# Patient Record
Sex: Female | Born: 1935 | Race: Black or African American | Hispanic: No | Marital: Single | Smoking: Never smoker
Health system: Southern US, Community
[De-identification: ages and names within clinical notes are randomized; demographics above are authoritative.]

## PROBLEM LIST (undated history)

## (undated) DIAGNOSIS — I639 Cerebral infarction, unspecified: Secondary | ICD-10-CM

## (undated) DIAGNOSIS — I10 Essential (primary) hypertension: Secondary | ICD-10-CM

## (undated) HISTORY — DX: Essential (primary) hypertension: I10

---

## 2000-02-23 ENCOUNTER — Emergency Department (HOSPITAL_COMMUNITY): Admission: EM | Admit: 2000-02-23 | Discharge: 2000-02-23 | Payer: Self-pay | Admitting: Emergency Medicine

## 2002-01-12 ENCOUNTER — Emergency Department (HOSPITAL_COMMUNITY): Admission: EM | Admit: 2002-01-12 | Discharge: 2002-01-12 | Payer: Self-pay | Admitting: Emergency Medicine

## 2002-01-12 ENCOUNTER — Encounter: Payer: Self-pay | Admitting: Emergency Medicine

## 2012-01-13 ENCOUNTER — Other Ambulatory Visit: Payer: Self-pay | Admitting: Family Medicine

## 2012-01-13 ENCOUNTER — Other Ambulatory Visit: Payer: Self-pay

## 2012-01-13 ENCOUNTER — Ambulatory Visit
Admission: RE | Admit: 2012-01-13 | Discharge: 2012-01-13 | Disposition: A | Discharge status: Home / Self Care | Payer: BC Managed Care – PPO | Source: Ambulatory Visit | Attending: Family Medicine | Admitting: Family Medicine

## 2012-01-13 DIAGNOSIS — R590 Localized enlarged lymph nodes: Secondary | ICD-10-CM

## 2012-01-25 ENCOUNTER — Encounter (INDEPENDENT_AMBULATORY_CARE_PROVIDER_SITE_OTHER): Payer: Self-pay | Admitting: Surgery

## 2012-02-08 ENCOUNTER — Ambulatory Visit (INDEPENDENT_AMBULATORY_CARE_PROVIDER_SITE_OTHER): Payer: BC Managed Care – PPO | Admitting: Surgery

## 2013-03-20 ENCOUNTER — Inpatient Hospital Stay (HOSPITAL_COMMUNITY)
Admission: EM | Admit: 2013-03-20 | Discharge: 2013-03-23 | DRG: 557 | Disposition: A | Discharge status: Home Health | Payer: Medicare Other | Attending: Internal Medicine | Admitting: Internal Medicine

## 2013-03-20 ENCOUNTER — Encounter (HOSPITAL_COMMUNITY): Payer: Self-pay | Admitting: Emergency Medicine

## 2013-03-20 ENCOUNTER — Emergency Department (HOSPITAL_COMMUNITY): Payer: Medicare Other

## 2013-03-20 DIAGNOSIS — I10 Essential (primary) hypertension: Secondary | ICD-10-CM | POA: Diagnosis present

## 2013-03-20 DIAGNOSIS — I672 Cerebral atherosclerosis: Secondary | ICD-10-CM | POA: Diagnosis present

## 2013-03-20 DIAGNOSIS — G9340 Encephalopathy, unspecified: Secondary | ICD-10-CM | POA: Diagnosis present

## 2013-03-20 DIAGNOSIS — E860 Dehydration: Secondary | ICD-10-CM | POA: Diagnosis present

## 2013-03-20 DIAGNOSIS — Y92009 Unspecified place in unspecified non-institutional (private) residence as the place of occurrence of the external cause: Secondary | ICD-10-CM

## 2013-03-20 DIAGNOSIS — R7402 Elevation of levels of lactic acid dehydrogenase (LDH): Secondary | ICD-10-CM | POA: Diagnosis present

## 2013-03-20 DIAGNOSIS — I6350 Cerebral infarction due to unspecified occlusion or stenosis of unspecified cerebral artery: Secondary | ICD-10-CM | POA: Diagnosis present

## 2013-03-20 DIAGNOSIS — M6282 Rhabdomyolysis: Principal | ICD-10-CM | POA: Diagnosis present

## 2013-03-20 DIAGNOSIS — D72829 Elevated white blood cell count, unspecified: Secondary | ICD-10-CM | POA: Diagnosis present

## 2013-03-20 DIAGNOSIS — Z66 Do not resuscitate: Secondary | ICD-10-CM | POA: Diagnosis present

## 2013-03-20 DIAGNOSIS — I6789 Other cerebrovascular disease: Secondary | ICD-10-CM | POA: Diagnosis present

## 2013-03-20 DIAGNOSIS — IMO0002 Reserved for concepts with insufficient information to code with codable children: Secondary | ICD-10-CM

## 2013-03-20 DIAGNOSIS — E876 Hypokalemia: Secondary | ICD-10-CM | POA: Diagnosis not present

## 2013-03-20 DIAGNOSIS — X31XXXA Exposure to excessive natural cold, initial encounter: Secondary | ICD-10-CM | POA: Diagnosis present

## 2013-03-20 DIAGNOSIS — N179 Acute kidney failure, unspecified: Secondary | ICD-10-CM | POA: Diagnosis present

## 2013-03-20 DIAGNOSIS — W19XXXA Unspecified fall, initial encounter: Secondary | ICD-10-CM

## 2013-03-20 DIAGNOSIS — E870 Hyperosmolality and hypernatremia: Secondary | ICD-10-CM | POA: Diagnosis present

## 2013-03-20 DIAGNOSIS — T68XXXA Hypothermia, initial encounter: Secondary | ICD-10-CM

## 2013-03-20 DIAGNOSIS — E43 Unspecified severe protein-calorie malnutrition: Secondary | ICD-10-CM | POA: Insufficient documentation

## 2013-03-20 DIAGNOSIS — R339 Retention of urine, unspecified: Secondary | ICD-10-CM | POA: Diagnosis not present

## 2013-03-20 DIAGNOSIS — Z79899 Other long term (current) drug therapy: Secondary | ICD-10-CM

## 2013-03-20 DIAGNOSIS — R7401 Elevation of levels of liver transaminase levels: Secondary | ICD-10-CM | POA: Diagnosis present

## 2013-03-20 LAB — URINALYSIS, ROUTINE W REFLEX MICROSCOPIC
Nitrite: NEGATIVE
Protein, ur: 100 mg/dL — AB
Specific Gravity, Urine: 1.023 (ref 1.005–1.030)
Urobilinogen, UA: 1 mg/dL (ref 0.0–1.0)

## 2013-03-20 LAB — COMPREHENSIVE METABOLIC PANEL
ALT: 43 U/L — ABNORMAL HIGH (ref 0–35)
Albumin: 3.4 g/dL — ABNORMAL LOW (ref 3.5–5.2)
BUN: 60 mg/dL — ABNORMAL HIGH (ref 6–23)
Chloride: 104 mEq/L (ref 96–112)
Creatinine, Ser: 1.74 mg/dL — ABNORMAL HIGH (ref 0.50–1.10)
GFR calc non Af Amer: 26 mL/min — ABNORMAL LOW (ref >90)
Potassium: 4.1 mEq/L (ref 3.5–5.1)
Total Bilirubin: 0.9 mg/dL (ref 0.3–1.2)

## 2013-03-20 LAB — CBC WITH DIFFERENTIAL/PLATELET
Basophils Absolute: 0 10*3/uL (ref 0.0–0.1)
Basophils Relative: 0 % (ref 0–1)
Eosinophils Relative: 0 % (ref 0–5)
Hemoglobin: 15.7 g/dL — ABNORMAL HIGH (ref 12.0–15.0)
Lymphocytes Relative: 8 % — ABNORMAL LOW (ref 12–46)
Lymphs Abs: 1.5 10*3/uL (ref 0.7–4.0)
Monocytes Relative: 9 % (ref 3–12)
Neutrophils Relative %: 83 % — ABNORMAL HIGH (ref 43–77)
Platelets: ADEQUATE 10*3/uL (ref 150–400)
RDW: 15.4 % (ref 11.5–15.5)
Smear Review: ADEQUATE
WBC: 18.7 10*3/uL — ABNORMAL HIGH (ref 4.0–10.5)

## 2013-03-20 LAB — CG4 I-STAT (LACTIC ACID): Lactic Acid, Venous: 2.88 mmol/L — ABNORMAL HIGH (ref 0.5–2.2)

## 2013-03-20 LAB — TROPONIN I
Troponin I: <0.3 ng/mL (ref <0.30)
Troponin I: <0.3 ng/mL (ref <0.30)

## 2013-03-20 LAB — URINE MICROSCOPIC-ADD ON

## 2013-03-20 LAB — POCT I-STAT, CHEM 8
BUN: 53 mg/dL — ABNORMAL HIGH (ref 6–23)
Glucose, Bld: 145 mg/dL — ABNORMAL HIGH (ref 70–99)
HCT: 49 % — ABNORMAL HIGH (ref 36.0–46.0)
Hemoglobin: 16.7 g/dL — ABNORMAL HIGH (ref 12.0–15.0)
Sodium: 149 mEq/L — ABNORMAL HIGH (ref 135–145)
TCO2: 23 mmol/L (ref 0–100)

## 2013-03-20 LAB — CK: Total CK: 2267 U/L — ABNORMAL HIGH (ref 7–177)

## 2013-03-20 MED ORDER — ACETAMINOPHEN 650 MG RE SUPP: 650.0000 mg | Freq: Four times a day (QID) | RECTAL | Status: DC | PRN

## 2013-03-20 MED ORDER — SODIUM CHLORIDE 0.9 % IV BOLUS (SEPSIS)
1000.0000 mL | Freq: Once | INTRAVENOUS | Status: AC
  Administered 2013-03-20: 1000 mL via INTRAVENOUS

## 2013-03-20 MED ORDER — SODIUM CHLORIDE 0.9 % IJ SOLN
3.0000 mL | Freq: Two times a day (BID) | INTRAMUSCULAR | Status: DC
  Administered 2013-03-21 – 2013-03-22 (×2): 3 mL via INTRAVENOUS

## 2013-03-20 MED ORDER — ACETAMINOPHEN 325 MG PO TABS: 650.0000 mg | ORAL_TABLET | Freq: Four times a day (QID) | ORAL | Status: DC | PRN

## 2013-03-20 MED ORDER — ALBUTEROL SULFATE (5 MG/ML) 0.5% IN NEBU: 2.5000 mg | INHALATION_SOLUTION | RESPIRATORY_TRACT | Status: DC | PRN

## 2013-03-20 MED ORDER — DEXTROSE-NACL 5-0.45 % IV SOLN
INTRAVENOUS | Status: DC
  Administered 2013-03-20 – 2013-03-21 (×2): via INTRAVENOUS

## 2013-03-20 MED ORDER — AMLODIPINE BESYLATE 2.5 MG PO TABS
2.5000 mg | ORAL_TABLET | Freq: Every day | ORAL | Status: DC
  Filled 2013-03-20 (×3): qty 1

## 2013-03-20 MED ORDER — HYDRALAZINE HCL 20 MG/ML IJ SOLN
10.0000 mg | Freq: Four times a day (QID) | INTRAMUSCULAR | Status: DC | PRN
  Administered 2013-03-20: 10 mg via INTRAVENOUS
  Filled 2013-03-20: qty 1

## 2013-03-20 MED ORDER — ONDANSETRON HCL 4 MG/2ML IJ SOLN: 4.0000 mg | Freq: Four times a day (QID) | INTRAMUSCULAR | Status: DC | PRN

## 2013-03-20 MED ORDER — ONDANSETRON HCL 4 MG PO TABS: 4.0000 mg | ORAL_TABLET | Freq: Four times a day (QID) | ORAL | Status: DC | PRN

## 2013-03-20 MED ORDER — ENOXAPARIN SODIUM 40 MG/0.4ML ~~LOC~~ SOLN
40.0000 mg | SUBCUTANEOUS | Status: DC
  Administered 2013-03-20 – 2013-03-22 (×3): 40 mg via SUBCUTANEOUS
  Filled 2013-03-20 (×5): qty 0.4

## 2013-03-20 MED ORDER — SODIUM CHLORIDE 0.9 % IV SOLN: INTRAVENOUS | Status: DC

## 2013-03-20 NOTE — H&P (Signed)
[facility]  History and Physical  Cristina Patton NWG:[id number] DOB: 04-18-1927 DOA: 03/20/2013  Referring physician: EDP PCP: No primary provider on file.  Outpatient Specialists:  1. None  Chief Complaint: Presumed fall at home.  HPI: Cristina Patton is a 79 y.o. female with history of hypertension presented to the ER after being found by housekeeping fully clothed lying in the bathtub in her own urine and feces for 24-48 hours. She was also confused and is unable to give any history secondary to same. According to her niece at the bedside, patient is single, lives alone, quite coherent and independent of activities of daily living. Help comes home 3 times a week to clean her yard in her house. She was last seen or heard well 48 hours ago until today. Patient currently says she's okay and denies any complaints. In the ED, she was initially hypothermic but that resolved, sodium 146, creatinine 1.74 (baseline not available) AST 76, ALT 43, WBC 18.7, CK 2267, CT head and neck and chest x-ray without acute findings. UA unremarkable. Patient has been bolused with a liter of fluid and hospitalist patient requested.  Review of Systems: Unable to perform review of systems secondary to patient's altered mental status  Past Medical History  Diagnosis Date  . Hypertension    History reviewed. No pertinent past surgical history. Social History:  reports that she has never smoked. She does not have any smokeless tobacco history on file. She reports that she does not drink alcohol or use illicit drugs.   No Known Allergies  History reviewed. No pertinent family history. unable to obtain secondary to patient's altered mental status  Prior to Admission medications   Not on File   Physical Exam: Filed Vitals:   03/20/13 1602 03/20/13 1615 03/20/13 1628 03/20/13 1656  BP: 172/83 165/78  176/73  Pulse: 112 112  104  Temp:   97.7 F (36.5 C) 98.3 F (36.8 C)  TempSrc:   Rectal  Oral  Resp: 20 15  18   Weight:    55.611 kg (122 lb 9.6 oz)  SpO2: 95% 95%  98%     General exam: Moderately built and nourished female patient, lying comfortably supine on the bed in no obvious distress.  Head, eyes and ENT: Nontraumatic and normocephalic. Pupils equally reacting to light and accommodation-bilateral nearly mature cataracts. Oral mucosa dry.  Neck: Supple. No JVD, carotid bruit or thyromegaly.  Lymphatics: No lymphadenopathy.  Respiratory system: Clear to auscultation. No increased work of breathing.  Cardiovascular system: S1 and S2 heard, RRR. No JVD, murmurs, gallops, clicks or pedal edema.  Gastrointestinal system: Abdomen is nondistended, soft and nontender. Normal bowel sounds heard. No organomegaly or masses appreciated.  Central nervous system: Drowsy but easily arousable to call and oriented to self and place. Follow some instructions. No focal neurological deficits.  Extremities: Symmetric 5 x 5 power. Peripheral pulses symmetrically felt.   Skin: Patient has mild bruising of bilateral elbows and a scabbed area of skin tear approximately 1 x 1 cm between the shoulder blades without any acute findings or bleeding.  Musculoskeletal system: Negative exam.  Psychiatry: Pleasant and cooperative.   Labs on Admission:  Basic Metabolic Panel:  Recent Labs Lab 03/20/13 1400 03/20/13 1416  NA 146* 149*  K 4.1 3.3*  CL 104 111  CO2 21  --   GLUCOSE 143* 145*  BUN 60* 53*  CREATININE 1.74* 2.00*  CALCIUM 10.5  --    Liver Function Tests:  Recent Labs Lab 03/20/13 1400  AST 76*  ALT 43*  ALKPHOS 58  BILITOT 0.9  PROT 8.7*  ALBUMIN 3.4*   No results found for this basename: LIPASE, AMYLASE,  in the last 168 hours No results found for this basename: AMMONIA,  in the last 168 hours CBC:  Recent Labs Lab 03/20/13 1359 03/20/13 1416  WBC 18.7*  --   NEUTROABS 15.5*  --   HGB 15.7* 16.7*  HCT 43.7 49.0*  MCV 79.9  --   PLT PLATELET  CLUMPS NOTED ON SMEAR, COUNT APPEARS ADEQUATE  --    Cardiac Enzymes:  Recent Labs Lab 03/20/13 1359  CKTOTAL 2267*  TROPONINI <0.30    BNP (last 3 results) No results found for this basename: PROBNP,  in the last 8760 hours CBG: No results found for this basename: GLUCAP,  in the last 168 hours  Radiological Exams on Admission: Ct Head Wo Contrast  03/20/2013   CLINICAL DATA:  Question of fall.  EXAM: CT HEAD WITHOUT CONTRAST  CT CERVICAL SPINE WITHOUT CONTRAST  TECHNIQUE: Multidetector CT imaging of the head and cervical spine was performed following the standard protocol without intravenous contrast. Multiplanar CT image reconstructions of the cervical spine were also generated.  COMPARISON:  None.  FINDINGS: CT HEAD FINDINGS  No skull fracture or intracranial hemorrhage.  Small vessel disease type changes without CT evidence of large acute infarct.  Mild atrophy.  Vascular calcifications.  No intracranial mass lesion noted on this unenhanced exam.  CT CERVICAL SPINE FINDINGS  No cervical spine fracture.  Head tilt to left. Mild curvature cervical spine. No abnormal prevertebral soft tissue swelling. Question Mild edema posterior to the paraspinal musculature. If there is a high clinical suspicion of ligamentous injury, flexion and extension views or MR can be performed for further delineation.  Scarring lung apices.  IMPRESSION: No intracranial hemorrhage or skull fracture.  No cervical spine fracture detected.  Please see above.   Electronically Signed   By: Bridgett Larsson M.D.   On: 03/20/2013 14:40   Ct Cervical Spine Wo Contrast  03/20/2013   CLINICAL DATA:  Question of fall.  EXAM: CT HEAD WITHOUT CONTRAST  CT CERVICAL SPINE WITHOUT CONTRAST  TECHNIQUE: Multidetector CT imaging of the head and cervical spine was performed following the standard protocol without intravenous contrast. Multiplanar CT image reconstructions of the cervical spine were also generated.  COMPARISON:  None.   FINDINGS: CT HEAD FINDINGS  No skull fracture or intracranial hemorrhage.  Small vessel disease type changes without CT evidence of large acute infarct.  Mild atrophy.  Vascular calcifications.  No intracranial mass lesion noted on this unenhanced exam.  CT CERVICAL SPINE FINDINGS  No cervical spine fracture.  Head tilt to left. Mild curvature cervical spine. No abnormal prevertebral soft tissue swelling. Question Mild edema posterior to the paraspinal musculature. If there is a high clinical suspicion of ligamentous injury, flexion and extension views or MR can be performed for further delineation.  Scarring lung apices.  IMPRESSION: No intracranial hemorrhage or skull fracture.  No cervical spine fracture detected.  Please see above.   Electronically Signed   By: Bridgett Larsson M.D.   On: 03/20/2013 14:40   Dg Chest Port 1 View  03/20/2013   CLINICAL DATA:  Confusion and weakness.  EXAM: PORTABLE CHEST - 1 VIEW  COMPARISON:  01/13/2012.  FINDINGS: Mediastinum and hilar structures are normal. Stable elevation left hemidiaphragm present. Mild basilar atelectasis. Poor inspiration. Heart size normal. Pulmonary  vascularity normal. No acute bony abnormality.  IMPRESSION: Poor inspiration. Mild atelectasis left lung base. Stable elevation left hemidiaphragm . No significant abnormality otherwise noted.   Electronically Signed   By: Maisie Fus  Register   On: 03/20/2013 14:18    EKG: Independently reviewed. Sinus tachycardia at 107 beats per minute, normal axis, some baseline artifacts but no acute findings.  Assessment/Plan Principal Problem:   Dehydration Active Problems:   Acute renal failure   Leukocytosis, unspecified   Acute encephalopathy   Fall at home- ? syncope.   Hypernatremia   HTN (hypertension)   Dehydration with hypernatremia - Secondary to poor oral intake from fall and altered mental status - Hydrate with hypotonic IV fluids  Acute renal failure - Secondary to dehydration and mild  rhabdomyolysis - No details if patient is an ACE inhibitor/ARB/diuretics or NSAIDs. - IV fluids and follow BMP in a.m. If creatinine does not normalize, consider renal ultrasound.  Mild rhabdomyolysis - Secondary to fall and lying in bathtub for extended period of time - Strict input and output - IV fluids and follow CK in the a.m. - Mild transaminitis most likely secondary to rhabdomyolysis  Acute encephalopathy - Likely secondary to acute medical illness: Dehydration, hypernatremia, acute renal failure - Treat underlying cause and follow clinically. - Check TSH. UA not suggestive of UTI. - If mental status does not normalize, consider MRI brain  Hypothermia - Possibly cold exposure from lying in bathtub - Resolved. - Check TSH.  Possible fall/? Syncope - No clear history available. - Cycle cardiac enzymes, check 2-D echo and carotid Dopplers. - PT and OT evaluation  Leukocytosis - Likely stress margination. - Follow CBC in a.m.  Uncontrolled hypertension - Unclear if she is on any medications at home - Start oral amlodipine 2.5 mg daily and when necessary IV hydralazine      Code Status: Full  Family Communication: Discussed with patient's niece Ms. Jera Headings at bedside.  Disposition Plan: To be determined   Time spent: 65 minutes  Hessie Varone, MD, FACP, FHM. [facility] Pager [phone]  If 7PM-7AM, please contact night-coverage [email or web address] Password [email or web address] 03/20/2013, 5:21 PM

## 2013-03-20 NOTE — ED Notes (Signed)
Family at bedside. 

## 2013-03-20 NOTE — ED Provider Notes (Signed)
CSN: [id number]     Arrival date & time 03/20/13  1238 History   First MD Initiated Contact with Patient 03/20/13 1303     Chief Complaint  Patient presents with  . Fall   (Consider location/radiation/quality/duration/timing/severity/associated sxs/prior Treatment) HPI  FULL CODE  Cristina Patton is a 79 y.o.female with a significant PMH of hypertension who supposedly is normally driving and alert presents to the ER after being found by house keeping fully clothes in the bath tube in her own urine and feces suggesting that she has been there for more than 24 hours. She has altered mental status, confused and is unable to tell us what happened. No obvious signs of trauma noted.    Patient oral temperature on arrival was noted to be 95.5 but rectally it was 98.6.  Past Medical History  Diagnosis Date  . Hypertension    History reviewed. No pertinent past surgical history. History reviewed. No pertinent family history. History  Substance Use Topics  . Smoking status: Never Smoker   . Smokeless tobacco: Not on file  . Alcohol Use: No   OB History   Grav Para Term Preterm Abortions TAB SAB Ect Mult Living                 Review of Systems LEVEL 5- AMS  Allergies  Review of patient's allergies indicates no known allergies.  Home Medications   Current Outpatient Rx  Name  Route  Sig  Dispense  Refill  . aspirin 81 MG tablet   Oral   Take 81 mg by mouth daily.         Marland Kitchen olmesartan-hydrochlorothiazide (BENICAR HCT) 20-12.5 MG per tablet   Oral   Take 1 tablet by mouth daily.          BP 169/72  Pulse 109  Temp(Src) 95.5 F (35.3 C) (Oral)  Resp 16  SpO2 99% Physical Exam  Nursing note and vitals reviewed. Constitutional: Vital signs are normal. She appears well-developed and well-nourished. She appears lethargic. She has a sickly appearance. She appears distressed. Backboard in place.  HENT:  Head: Normocephalic and atraumatic.  Cardiovascular: Normal  rate and regular rhythm.   Pulmonary/Chest: Effort normal.  Abdominal: Soft.  Neurological: She appears lethargic.  Skin: Skin is warm and dry.       ED Course  Procedures (including critical care time) Labs Review Labs Reviewed  CBC WITH DIFFERENTIAL - Abnormal; Notable for the following:    WBC 18.7 (*)    RBC 5.47 (*)    Hemoglobin 15.7 (*)    Neutrophils Relative % 83 (*)    Lymphocytes Relative 8 (*)    Neutro Abs 15.5 (*)    Monocytes Absolute 1.7 (*)    All other components within normal limits  CK - Abnormal; Notable for the following:    Total CK 2267 (*)    All other components within normal limits  COMPREHENSIVE METABOLIC PANEL - Abnormal; Notable for the following:    Sodium 146 (*)    Glucose, Bld 143 (*)    BUN 60 (*)    Creatinine, Ser 1.74 (*)    Total Protein 8.7 (*)    Albumin 3.4 (*)    AST 76 (*)    ALT 43 (*)    GFR calc non Af Amer 26 (*)    GFR calc Af Amer 30 (*)    All other components within normal limits  CG4 I-STAT (LACTIC ACID) - Abnormal; Notable for  the following:    Lactic Acid, Venous 2.88 (*)    All other components within normal limits  POCT I-STAT, CHEM 8 - Abnormal; Notable for the following:    Sodium 149 (*)    Potassium 3.3 (*)    BUN 53 (*)    Creatinine, Ser 2.00 (*)    Glucose, Bld 145 (*)    Hemoglobin 16.7 (*)    HCT 49.0 (*)    All other components within normal limits  TROPONIN I  OCCULT BLOOD X 1 CARD TO LAB, STOOL  URINALYSIS, ROUTINE W REFLEX MICROSCOPIC   Imaging Review Ct Head Wo Contrast  03/20/2013   CLINICAL DATA:  Question of fall.  EXAM: CT HEAD WITHOUT CONTRAST  CT CERVICAL SPINE WITHOUT CONTRAST  TECHNIQUE: Multidetector CT imaging of the head and cervical spine was performed following the standard protocol without intravenous contrast. Multiplanar CT image reconstructions of the cervical spine were also generated.  COMPARISON:  None.  FINDINGS: CT HEAD FINDINGS  No skull fracture or intracranial  hemorrhage.  Small vessel disease type changes without CT evidence of large acute infarct.  Mild atrophy.  Vascular calcifications.  No intracranial mass lesion noted on this unenhanced exam.  CT CERVICAL SPINE FINDINGS  No cervical spine fracture.  Head tilt to left. Mild curvature cervical spine. No abnormal prevertebral soft tissue swelling. Question Mild edema posterior to the paraspinal musculature. If there is a high clinical suspicion of ligamentous injury, flexion and extension views or MR can be performed for further delineation.  Scarring lung apices.  IMPRESSION: No intracranial hemorrhage or skull fracture.  No cervical spine fracture detected.  Please see above.   Electronically Signed   By: Bridgett Larsson M.D.   On: 03/20/2013 14:40   Ct Cervical Spine Wo Contrast  03/20/2013   CLINICAL DATA:  Question of fall.  EXAM: CT HEAD WITHOUT CONTRAST  CT CERVICAL SPINE WITHOUT CONTRAST  TECHNIQUE: Multidetector CT imaging of the head and cervical spine was performed following the standard protocol without intravenous contrast. Multiplanar CT image reconstructions of the cervical spine were also generated.  COMPARISON:  None.  FINDINGS: CT HEAD FINDINGS  No skull fracture or intracranial hemorrhage.  Small vessel disease type changes without CT evidence of large acute infarct.  Mild atrophy.  Vascular calcifications.  No intracranial mass lesion noted on this unenhanced exam.  CT CERVICAL SPINE FINDINGS  No cervical spine fracture.  Head tilt to left. Mild curvature cervical spine. No abnormal prevertebral soft tissue swelling. Question Mild edema posterior to the paraspinal musculature. If there is a high clinical suspicion of ligamentous injury, flexion and extension views or MR can be performed for further delineation.  Scarring lung apices.  IMPRESSION: No intracranial hemorrhage or skull fracture.  No cervical spine fracture detected.  Please see above.   Electronically Signed   By: Bridgett Larsson M.D.    On: 03/20/2013 14:40   Dg Chest Port 1 View  03/20/2013   CLINICAL DATA:  Confusion and weakness.  EXAM: PORTABLE CHEST - 1 VIEW  COMPARISON:  01/13/2012.  FINDINGS: Mediastinum and hilar structures are normal. Stable elevation left hemidiaphragm present. Mild basilar atelectasis. Poor inspiration. Heart size normal. Pulmonary vascularity normal. No acute bony abnormality.  IMPRESSION: Poor inspiration. Mild atelectasis left lung base. Stable elevation left hemidiaphragm . No significant abnormality otherwise noted.   Electronically Signed   By: Maisie Fus  Register   On: 03/20/2013 14:18    EKG Interpretation   None  MDM   1. Rhabdomyolysis   2. Dehydration      Patients niece and home keeper are here. The patient is becoming more cognoscente with fluids and time. Her EKG, Troponin, and imaging did not show any acute process. Her labs show that she has rhabdomyolysis and that she is dehydrated. Rehydrating in the ED.  Patient to be admitted to [facility], tele, obs, team 10, [facility]    Dorthula Matas, PA-C 03/20/13 1537

## 2013-03-20 NOTE — ED Notes (Signed)
Pt here by ems ? Fall, pt was in tub for 2 days, denies falling sts she just laid back and stayed for 2 days, immobilized in ked in position found in. Pt complains of pain with movement, no signs of trauma noted per ems. Per cleaning lady that found patient, she may or may not be morec onfused than normal. sts alert and oriented to all but event.

## 2013-03-20 NOTE — ED Notes (Signed)
Did in and out cath on patient dark yellow urine in return 

## 2013-03-21 ENCOUNTER — Encounter (HOSPITAL_COMMUNITY): Payer: Self-pay | Admitting: Emergency Medicine

## 2013-03-21 DIAGNOSIS — R55 Syncope and collapse: Secondary | ICD-10-CM

## 2013-03-21 DIAGNOSIS — I517 Cardiomegaly: Secondary | ICD-10-CM

## 2013-03-21 DIAGNOSIS — E43 Unspecified severe protein-calorie malnutrition: Secondary | ICD-10-CM | POA: Insufficient documentation

## 2013-03-21 LAB — COMPREHENSIVE METABOLIC PANEL
ALT: 33 U/L (ref 0–35)
Alkaline Phosphatase: 44 U/L (ref 39–117)
BUN: 54 mg/dL — ABNORMAL HIGH (ref 6–23)
CO2: 21 mEq/L (ref 19–32)
Calcium: 9 mg/dL (ref 8.4–10.5)
Chloride: 110 mEq/L (ref 96–112)
GFR calc Af Amer: 38 mL/min — ABNORMAL LOW (ref >90)
GFR calc non Af Amer: 33 mL/min — ABNORMAL LOW (ref >90)
Glucose, Bld: 228 mg/dL — ABNORMAL HIGH (ref 70–99)
Sodium: 147 mEq/L — ABNORMAL HIGH (ref 135–145)

## 2013-03-21 LAB — CBC
HCT: 36.7 % (ref 36.0–46.0)
Hemoglobin: 12.9 g/dL (ref 12.0–15.0)
MCH: 28.1 pg (ref 26.0–34.0)
MCV: 80 fL (ref 78.0–100.0)
RBC: 4.59 MIL/uL (ref 3.87–5.11)
WBC: 16.6 10*3/uL — ABNORMAL HIGH (ref 4.0–10.5)

## 2013-03-21 LAB — GLUCOSE, CAPILLARY
Glucose-Capillary: 194 mg/dL — ABNORMAL HIGH (ref 70–99)
Glucose-Capillary: 155 mg/dL — ABNORMAL HIGH (ref 70–99)
Glucose-Capillary: 116 mg/dL — ABNORMAL HIGH (ref 70–99)

## 2013-03-21 LAB — HEMOGLOBIN A1C: Hgb A1c MFr Bld: 5.7 % — ABNORMAL HIGH (ref <5.7)

## 2013-03-21 LAB — SODIUM, URINE, RANDOM: Sodium, Ur: 17 mEq/L

## 2013-03-21 MED ORDER — POTASSIUM CHLORIDE 10 MEQ/100ML IV SOLN
10.0000 meq | INTRAVENOUS | Status: AC
  Administered 2013-03-21 (×3): 10 meq via INTRAVENOUS
  Filled 2013-03-21 (×4): qty 100

## 2013-03-21 MED ORDER — GLUCERNA SHAKE PO LIQD
237.0000 mL | Freq: Two times a day (BID) | ORAL | Status: DC
  Administered 2013-03-22 – 2013-03-23 (×3): 237 mL via ORAL

## 2013-03-21 MED ORDER — AMLODIPINE BESYLATE 10 MG PO TABS
10.0000 mg | ORAL_TABLET | Freq: Every day | ORAL | Status: DC
  Administered 2013-03-22 – 2013-03-23 (×2): 10 mg via ORAL
  Filled 2013-03-21 (×4): qty 1

## 2013-03-21 MED ORDER — POTASSIUM CHLORIDE IN NACL 40-0.9 MEQ/L-% IV SOLN
INTRAVENOUS | Status: AC
  Administered 2013-03-21: 75 mL/h via INTRAVENOUS
  Administered 2013-03-22: via INTRAVENOUS
  Filled 2013-03-21 (×2): qty 1000

## 2013-03-21 MED ORDER — TAMSULOSIN HCL 0.4 MG PO CAPS
0.4000 mg | ORAL_CAPSULE | Freq: Every day | ORAL | Status: DC
  Administered 2013-03-22 – 2013-03-23 (×2): 0.4 mg via ORAL
  Filled 2013-03-21 (×4): qty 1

## 2013-03-21 MED ORDER — DEXTROSE 5 % IV SOLN
INTRAVENOUS | Status: DC
  Administered 2013-03-21: 08:00:00 via INTRAVENOUS

## 2013-03-21 NOTE — Progress Notes (Signed)
INITIAL NUTRITION ASSESSMENT  DOCUMENTATION CODES Per approved criteria  -Severe malnutrition in the context of chronic illness   INTERVENTION:  Glucerna Shake twice daily (220 kcals, 10 gm protein per 8 fl oz bottle) RD to follow for nutrition care plan  NUTRITION DIAGNOSIS: Increased nutrient needs related to wound healing as evidenced by estimated nutrition needs  Goal: Pt to meet >/= 90% of their estimated nutrition needs   Monitor:  PO & supplemental intake, weight, labs, I/O's  Reason for Assessment: Low Braden  79 y.o. female  Admitting Dx: Dehydration  ASSESSMENT: Patient with PMH of HTN presented to ER after being found by housekeeping fully clothed lying in the bathtub in her own urine and feces for 24-48 hours; patient was hypothermic and dehydration; bolused with a liter of fluid.  Patient sleepy upon RD entry; just returned from [facility]ascular Lab; patient lives alone and RD suspects patient's PO intake is suboptimal; patient with visible severe muscle wasting & subcutaneous fat loss to upper body; Stage II wound present to sacrum; at risk for further skin breakdown given low braden score -- RD to order supplements.  Patient meets criteria for severe malnutrition in the context of chronic illness as evidenced by suspected < 75% intake of estimated energy requirement for > 1 month, muscle loss (clavicles, acromion bone region, thoracic region) and subcutaneous fat loss (upper arm region).  Height: 5' 6 (167.6 cm)  Weight: Wt Readings from Last 1 Encounters:  03/21/13 126 lb 4.8 oz (57.289 kg)    Ideal Body Weight: 130 lb  % Ideal Body Weight: 96%  Wt Readings from Last 10 Encounters:  03/21/13 126 lb 4.8 oz (57.289 kg)    Usual Body Weight: unknown  % Usual Body Weight: ---  BMI:  20.5 kg/m2  Estimated Nutritional Needs: Kcal: 1250-1450 Protein: 60-70 gm Fluid: >/= 1.5 L  Skin: Stage II pressure ulcer to sacrum  Diet Order: Cardiac  EDUCATION  NEEDS: -No education needs identified at this time   Intake/Output Summary (Last 24 hours) at 03/21/13 1249 Last data filed at 03/21/13 0821  Gross per 24 hour  Intake   1575 ml  Output   1000 ml  Net    575 ml     Labs:   Recent Labs Lab 03/20/13 1400 03/20/13 1416 03/21/13 0239 03/21/13 0430  NA 146* 149* 147*  --   K 4.1 3.3* 2.7*  --   CL 104 111 110  --   CO2 21  --  21  --   BUN 60* 53* 54*  --   CREATININE 1.74* 2.00* 1.43*  --   CALCIUM 10.5  --  9.0  --   MG  --   --   --  2.5  GLUCOSE 143* 145* 228*  --     CBG (last 3)   Recent Labs  03/20/13 2102 03/21/13 0604 03/21/13 1114  GLUCAP 179* 194* 172*    Scheduled Meds: . amLODipine  2.5 mg Oral Daily  . enoxaparin (LOVENOX) injection  40 mg Subcutaneous Q24H  . sodium chloride  3 mL Intravenous Q12H  . tamsulosin  0.4 mg Oral Daily    Continuous Infusions: . 0.9 % NaCl with KCl 40 mEq / L 75 mL/hr (03/21/13 1042)    Past Medical History  Diagnosis Date  . Hypertension     History reviewed. No pertinent past surgical history.  Maureen Chatters, RD, LDN Pager #: [phone] After-Hours Pager #: [phone]

## 2013-03-21 NOTE — Progress Notes (Signed)
Clinical Social Work Department BRIEF PSYCHOSOCIAL ASSESSMENT 03/21/2013  Patient:  AHUVA, POYNOR     Account Number:  [id number]     Admit date:  03/20/2013  Clinical Social Worker:  Carren Rang  Date/Time:  03/21/2013 01:40 PM  Referred by:  Physician  Date Referred:  03/21/2013 Referred for  SNF Placement   Other Referral:   Interview type:  Other - See comment Other interview type:   CSW spoke to patinet's great niece, by bedside    PSYCHOSOCIAL DATA Living Status:  ALONE Admitted from facility:   Level of care:   Primary support name:  Kim Primary support relationship to patient:  FAMILY Degree of support available:   Supportive Family    CURRENT CONCERNS Current Concerns  Post-Acute Placement   Other Concerns:    SOCIAL WORK ASSESSMENT / PLAN Clinical Social Worker received referral for SNF placement at d/c. CSW introduced self and explained reason for visit. Patient had visitors by bedside. CSW explained SNF process to patient's great niece, Selena Batten. Patient's niece reported she is agreeable for SNF placement.  CSW will complete FL2 for MD's signature and will update family when bed offers are received.   Assessment/plan status:  Psychosocial Support/Ongoing Assessment of Needs Other assessment/ plan:   Information/referral to community resources:   SNF information    PATIENT'S/FAMILY'S RESPONSE TO PLAN OF CARE: Patient's family states they are agreeable to SNF placement.        Maree Krabbe, MSW, [facility] [phone]

## 2013-03-21 NOTE — Progress Notes (Signed)
Clinical Social Work Department CLINICAL SOCIAL WORK PLACEMENT NOTE 03/21/2013  Patient:  Cristina Patton, Cristina Patton  Account Number:  [id number] Admit date:  03/20/2013  Clinical Social Worker:  Carren Rang  Date/time:  03/21/2013 01:40 PM  Clinical Social Work is seeking post-discharge placement for this patient at the following level of care:   SKILLED NURSING   (*CSW will update this form in Epic as items are completed)   03/21/2013  Patient/family provided with [facility] Department of Clinical Social Work's list of facilities offering this level of care within the geographic area requested by the patient (or if unable, by the patient's family).  03/21/2013  Patient/family informed of their freedom to choose among providers that offer the needed level of care, that participate in Medicare, Medicaid or managed care program needed by the patient, have an available bed and are willing to accept the patient.  03/21/2013  Patient/family informed of [facility]' ownership interest in [facility], as well as of the fact that they are under no obligation to receive care at this facility.  PASARR submitted to EDS on 03/21/2013 PASARR number received from EDS on 03/21/2013  FL2 transmitted to all facilities in geographic area requested by pt/family on  03/21/2013 FL2 transmitted to all facilities within larger geographic area on   Patient informed that his/her managed care company has contracts with or will negotiate with  certain facilities, including the following:     Patient/family informed of bed offers received:   Patient chooses bed at  Physician recommends and patient chooses bed at    Patient to be transferred to  on   Patient to be transferred to facility by   The following physician request were entered in Epic:   Additional Comments:  Maree Krabbe, MSW, [employer] [phone]

## 2013-03-21 NOTE — Progress Notes (Signed)
BP DOWN 130/59.

## 2013-03-21 NOTE — Progress Notes (Signed)
Pt unable to void. Bladder scanned pt and pt had >999 ml in bladder. RN notified MD. New orders given to place foley and 0.4 mg Flomax daily. Pt tolerated procedure well with urine return. RN also notified MD of pulsating carotid artery. Will continue to monitor pt closely.  Genevive Bi, RN

## 2013-03-21 NOTE — Progress Notes (Signed)
Pt unable to swallow. RN entered pt room to give medication and pt had thick clear saliva hanging from side of mouth. RN set up suction and will suction pt PRN. Will continue to monitor closely. Holding oral medications until speech evaluation. Genevive Bi, RN

## 2013-03-21 NOTE — Care Management Note (Unsigned)
    Page 1 of 1   03/21/2013     4:32:09 PM   CARE MANAGEMENT NOTE 03/21/2013  Patient:  Cristina Patton, Cristina Patton   Account Number:  [id number]  Date Initiated:  03/21/2013  Documentation initiated by:  Kendra Grissett  Subjective/Objective Assessment:   PT FOUND DOWN AT HOME IN BATHTUB X 48H; ADM WITH ARF, DEHYDRATION AND MILD RHABDOMYOLYSIS.  PTA, PT LIVED ALONE AND WAS INDEPENDENT.     Action/Plan:   WILL LIKELY NEED SNF PLACEMENT.  PT WILL NEED PT/OT CONSULT FOR INSURANCE AUTH.  CSW CONSULTED TO FACILITATE POSSIBLE DC TO SNF WHEN MEDICALLY STABLE FOR DC.   Anticipated DC Date:  03/24/2013   Anticipated DC Plan:  SKILLED NURSING FACILITY  In-house referral  Clinical Social Worker      DC Planning Services  CM consult      Choice offered to / List presented to:             Status of service:  In process, will continue to follow Medicare Important Message given?   (If response is "NO", the following Medicare IM given date fields will be blank) Date Medicare IM given:   Date Additional Medicare IM given:    Discharge Disposition:    Per UR Regulation:  Reviewed for med. necessity/level of care/duration of stay  If discussed at Long Length of Stay Meetings, dates discussed:    Comments:

## 2013-03-21 NOTE — Progress Notes (Signed)
VASCULAR LAB PRELIMINARY  PRELIMINARY  PRELIMINARY  PRELIMINARY  Carotid duplex completed.    Preliminary report:  Bilateral:  1-39% ICA stenosis.  Vertebral artery flow is antegrade.     Brandon Scarbrough, RVS 03/21/2013, 12:48 PM

## 2013-03-21 NOTE — Progress Notes (Signed)
[facility]                                                                                Patient Demographics  Cristina Patton, is a 79 y.o. female, DOB - 1926/12/04, ZOX:[id number]  Admit date - 03/20/2013   Admitting Physician Elease Etienne, MD  Outpatient Primary MD for the patient is No primary provider on file.  LOS - 1   Chief Complaint  Patient presents with  . Fall        Assessment & Plan    Dehydration with hypernatremia  - Secondary to poor oral intake from fall and altered mental status  - Hydrate with hypotonic IV fluids    Acute renal failure  - Secondary to dehydration and mild rhabdomyolysis  - No details if patient is an ACE inhibitor/ARB/diuretics or NSAIDs.  - IV fluids and follow BMP in a.m. If creatinine does not normalize, consider renal ultrasound.    Mild rhabdomyolysis  - Secondary to fall and lying in bathtub for extended period of time  - Strict input and output  - IV fluids and follow CK in the a.m.  - Mild transaminitis most likely secondary to rhabdomyolysis      Acute encephalopathy  - Likely secondary to acute medical illness: Dehydration, hypernatremia, acute renal failure  - Treat underlying cause and follow clinically.  - He has some right-sided neglect, will obtain MRI to rule out CVA     Hypothermia  - Possibly cold exposure from lying in bathtub  - Resolved.      Possible fall/? Syncope  - No clear history available.  - Negative cardiac enzymes, stable EKG, monitor orthostatics,  check 2-D echo and carotid Dopplers.  - PT and OT evaluation      Leukocytosis  - Likely stress margination.  - She is afebrile, chest x-ray and UA stable we will monitor clinically     Uncontrolled hypertension  - Place her on Norvasc along with as needed IV hydralazine     Hypokalemia    replace and monitor     Code Status: DO NOT RESUSCITATE  Family Communication:  Niece  Disposition Plan: To be  decided likely SNF   Procedures CT scan head and C-spine, MRI Brain, carotid duplex ultrasound   Consults      Medications  Scheduled Meds: . amLODipine  2.5 mg Oral Daily  . enoxaparin (LOVENOX) injection  40 mg Subcutaneous Q24H  . feeding supplement (GLUCERNA SHAKE)  237 mL Oral BID BM  . sodium chloride  3 mL Intravenous Q12H  . tamsulosin  0.4 mg Oral Daily   Continuous Infusions: . 0.9 % NaCl with KCl 40 mEq / L 75 mL/hr (03/21/13 1042)   PRN Meds:.acetaminophen, acetaminophen, albuterol, hydrALAZINE, ondansetron (ZOFRAN) IV, ondansetron  DVT Prophylaxis  Lovenox   Lab Results  Component Value Date   PLT 579* 03/21/2013    Antibiotics    Anti-infectives   None          Subjective:   Cristina Patton today has, No headache, No chest pain, No abdominal pain - No Nausea, No new weakness tingling or numbness, No Cough - SOB.  Objective:   Filed Vitals:   03/21/13 0037 03/21/13 0500 03/21/13 0510 03/21/13 1346  BP: 130/59  153/72 155/73  Pulse: 112  100 98  Temp:   97.5 F (36.4 C) 98 F (36.7 C)  TempSrc:   Oral Oral  Resp:   18 18  Weight:  57.289 kg (126 lb 4.8 oz)    SpO2:   97% 97%    Wt Readings from Last 3 Encounters:  03/21/13 57.289 kg (126 lb 4.8 oz)     Intake/Output Summary (Last 24 hours) at 03/21/13 1430 Last data filed at 03/21/13 1610  Gross per 24 hour  Intake   1575 ml  Output   2000 ml  Net   -425 ml    Exam She is somnolent but answers basic questions and follows basic commands,, No new F.N deficits, but some right-sided neglect  [place].AT,PERRAL Supple Neck,No JVD, No cervical lymphadenopathy appriciated.  Symmetrical Chest wall movement, Good air movement bilaterally, CTAB RRR,No Gallops,Rubs or new Murmurs, No Parasternal Heave +ve B.Sounds, Abd Soft, Non tender, No organomegaly appriciated, No rebound - guarding or rigidity. No Cyanosis, Clubbing or edema, No new Rash or bruise     Data Review   Micro  Results No results found for this or any previous visit (from the past 240 hour(s)).  Radiology Reports Ct Head Wo Contrast  03/20/2013   CLINICAL DATA:  Question of fall.  EXAM: CT HEAD WITHOUT CONTRAST  CT CERVICAL SPINE WITHOUT CONTRAST  TECHNIQUE: Multidetector CT imaging of the head and cervical spine was performed following the standard protocol without intravenous contrast. Multiplanar CT image reconstructions of the cervical spine were also generated.  COMPARISON:  None.  FINDINGS: CT HEAD FINDINGS  No skull fracture or intracranial hemorrhage.  Small vessel disease type changes without CT evidence of large acute infarct.  Mild atrophy.  Vascular calcifications.  No intracranial mass lesion noted on this unenhanced exam.  CT CERVICAL SPINE FINDINGS  No cervical spine fracture.  Head tilt to left. Mild curvature cervical spine. No abnormal prevertebral soft tissue swelling. Question Mild edema posterior to the paraspinal musculature. If there is a high clinical suspicion of ligamentous injury, flexion and extension views or MR can be performed for further delineation.  Scarring lung apices.  IMPRESSION: No intracranial hemorrhage or skull fracture.  No cervical spine fracture detected.  Please see above.   Electronically Signed   By: Bridgett Larsson M.D.   On: 03/20/2013 14:40   Ct Cervical Spine Wo Contrast  03/20/2013   CLINICAL DATA:  Question of fall.  EXAM: CT HEAD WITHOUT CONTRAST  CT CERVICAL SPINE WITHOUT CONTRAST  TECHNIQUE: Multidetector CT imaging of the head and cervical spine was performed following the standard protocol without intravenous contrast. Multiplanar CT image reconstructions of the cervical spine were also generated.  COMPARISON:  None.  FINDINGS: CT HEAD FINDINGS  No skull fracture or intracranial hemorrhage.  Small vessel disease type changes without CT evidence of large acute infarct.  Mild atrophy.  Vascular calcifications.  No intracranial mass lesion noted on this  unenhanced exam.  CT CERVICAL SPINE FINDINGS  No cervical spine fracture.  Head tilt to left. Mild curvature cervical spine. No abnormal prevertebral soft tissue swelling. Question Mild edema posterior to the paraspinal musculature. If there is a high clinical suspicion of ligamentous injury, flexion and extension views or MR can be performed for further delineation.  Scarring lung apices.  IMPRESSION: No intracranial hemorrhage or skull fracture.  No cervical  spine fracture detected.  Please see above.   Electronically Signed   By: Bridgett Larsson M.D.   On: 03/20/2013 14:40   Dg Chest Port 1 View  03/20/2013   CLINICAL DATA:  Confusion and weakness.  EXAM: PORTABLE CHEST - 1 VIEW  COMPARISON:  01/13/2012.  FINDINGS: Mediastinum and hilar structures are normal. Stable elevation left hemidiaphragm present. Mild basilar atelectasis. Poor inspiration. Heart size normal. Pulmonary vascularity normal. No acute bony abnormality.  IMPRESSION: Poor inspiration. Mild atelectasis left lung base. Stable elevation left hemidiaphragm . No significant abnormality otherwise noted.   Electronically Signed   By: Maisie Fus  Register   On: 03/20/2013 14:18    CBC  Recent Labs Lab 03/20/13 1359 03/20/13 1416 03/21/13 0239  WBC 18.7*  --  16.6*  HGB 15.7* 16.7* 12.9  HCT 43.7 49.0* 36.7  PLT PLATELET CLUMPS NOTED ON SMEAR, COUNT APPEARS ADEQUATE  --  579*  MCV 79.9  --  80.0  MCH 28.7  --  28.1  MCHC 35.9  --  35.1  RDW 15.4  --  15.5  LYMPHSABS 1.5  --   --   MONOABS 1.7*  --   --   EOSABS 0.0  --   --   BASOSABS 0.0  --   --     Chemistries   Recent Labs Lab 03/20/13 1400 03/20/13 1416 03/21/13 0239 03/21/13 0430  NA 146* 149* 147*  --   K 4.1 3.3* 2.7*  --   CL 104 111 110  --   CO2 21  --  21  --   GLUCOSE 143* 145* 228*  --   BUN 60* 53* 54*  --   CREATININE 1.74* 2.00* 1.43*  --   CALCIUM 10.5  --  9.0  --   MG  --   --   --  2.5  AST 76*  --  52*  --   ALT 43*  --  33  --   ALKPHOS 58  --   44  --   BILITOT 0.9  --  0.8  --    ------------------------------------------------------------------------------------------------------------------ CrCl is unknown because there is no height on file for the current visit. ------------------------------------------------------------------------------------------------------------------  Recent Labs  03/20/13 2001  HGBA1C 5.7*   ------------------------------------------------------------------------------------------------------------------ No results found for this basename: CHOL, HDL, LDLCALC, TRIG, CHOLHDL, LDLDIRECT,  in the last 72 hours ------------------------------------------------------------------------------------------------------------------  Recent Labs  03/20/13 2001  TSH 0.264*   ------------------------------------------------------------------------------------------------------------------ No results found for this basename: VITAMINB12, FOLATE, FERRITIN, TIBC, IRON, RETICCTPCT,  in the last 72 hours  Coagulation profile No results found for this basename: INR, PROTIME,  in the last 168 hours  No results found for this basename: DDIMER,  in the last 72 hours  Cardiac Enzymes  Recent Labs Lab 03/20/13 1359 03/20/13 2001 03/21/13 0239  TROPONINI <0.30 <0.30 <0.30   ------------------------------------------------------------------------------------------------------------------ No components found with this basename: POCBNP,      Time Spent in minutes  40   Susa Raring K M.D on 03/21/2013 at 2:30 PM  Between 7am to 7pm - Pager - [phone]  After 7pm go to [email or web address] - password [email or web address]  And look for the night coverage person covering for me after hours  [facility] Office  [phone]

## 2013-03-21 NOTE — Progress Notes (Signed)
BP 174/81 PATIENT LETHARGIC  HYDRALAZINE 10 MG I.V. GIVEN.

## 2013-03-22 ENCOUNTER — Inpatient Hospital Stay (HOSPITAL_COMMUNITY): Payer: Medicare Other

## 2013-03-22 LAB — BASIC METABOLIC PANEL
BUN: 34 mg/dL — ABNORMAL HIGH (ref 6–23)
CO2: 23 mEq/L (ref 19–32)
Chloride: 118 mEq/L — ABNORMAL HIGH (ref 96–112)
GFR calc Af Amer: 67 mL/min — ABNORMAL LOW (ref >90)
Potassium: 4.1 mEq/L (ref 3.5–5.1)
Sodium: 149 mEq/L — ABNORMAL HIGH (ref 135–145)

## 2013-03-22 LAB — CBC
HCT: 35 % — ABNORMAL LOW (ref 36.0–46.0)
MCHC: 34.3 g/dL (ref 30.0–36.0)
MCV: 81.6 fL (ref 78.0–100.0)
RBC: 4.29 MIL/uL (ref 3.87–5.11)
RDW: 15.8 % — ABNORMAL HIGH (ref 11.5–15.5)
WBC: 13.1 10*3/uL — ABNORMAL HIGH (ref 4.0–10.5)

## 2013-03-22 LAB — LIPID PANEL
Cholesterol: 160 mg/dL (ref 0–200)
LDL Cholesterol: 79 mg/dL (ref 0–99)
Total CHOL/HDL Ratio: 2.6 RATIO
Triglycerides: 100 mg/dL (ref <150)
VLDL: 20 mg/dL (ref 0–40)

## 2013-03-22 LAB — GLUCOSE, CAPILLARY
Glucose-Capillary: 101 mg/dL — ABNORMAL HIGH (ref 70–99)
Glucose-Capillary: 117 mg/dL — ABNORMAL HIGH (ref 70–99)

## 2013-03-22 MED ORDER — ASPIRIN 81 MG PO CHEW: 81.0000 mg | CHEWABLE_TABLET | Freq: Every day | ORAL | Status: DC

## 2013-03-22 MED ORDER — CLOPIDOGREL BISULFATE 75 MG PO TABS
75.0000 mg | ORAL_TABLET | Freq: Every day | ORAL | Status: DC
  Administered 2013-03-22 – 2013-03-23 (×2): 75 mg via ORAL
  Filled 2013-03-22 (×3): qty 1

## 2013-03-22 MED ORDER — HYDRALAZINE HCL 20 MG/ML IJ SOLN: 10.0000 mg | Freq: Four times a day (QID) | INTRAMUSCULAR | Status: DC | PRN

## 2013-03-22 NOTE — Progress Notes (Signed)
Occupational Therapy Evaluation  Pt admitted after being found unresponsive in bathtub.  MRI revealed small Rt cerebellar infarct.  Pt presents to OT with cognitive deficits, Lt. Gaze preference, impaired standing and sitting balance.  She will benefit from continued OT to maximize safety and independence with BADLs to allow her to return to min A to supervision level with BADLs.  Pt will need SNF level rehab at discharge.     03/22/13 1100  OT Visit Information  Last OT Received On 03/22/13  Assistance Needed +1 (+2 if attempting to ambulate)  History of Present Illness is a 79 y.o. female with history of hypertension presented to the ER after being found by housekeeping fully clothed lying in the bathtub in her own urine and feces for 24-48 hours. She was also confused and is unable to give any history secondary to same.  MRI showed  Small acute right inferior cerebellar infarct. No mass effect or  Precautions  Precautions Fall  Restrictions  Weight Bearing Restrictions No  Home Living  Family/patient expects to be discharged to: Skilled nursing facility  Living Arrangements Alone  Prior Function  Level of Independence Independent  Comments Pt was independent with driving and community activities PTA  Communication  Communication No difficulties  Cognition  Arousal/Alertness Awake/alert  Behavior During Therapy WFL for tasks assessed/performed  Overall Cognitive Status Impaired/Different from baseline  Area of Impairment Orientation;Attention;Safety/judgement;Awareness;Problem solving;Memory  Orientation Level Disoriented to;Situation (uses calendar for time)  Current Attention Level Sustained (with min verbal cues)  Memory Decreased short-term memory (pulled out IV despite recent conversation with RN)  Safety/Judgement Decreased awareness of deficits;Decreased awareness of safety  Awareness Intellectual (with mod verbal cues)  Problem Solving Slow processing;Difficulty  sequencing;Requires verbal cues;Requires tactile cues  Upper Extremity Assessment  Upper Extremity Assessment Overall WFL for tasks assessed (no obvious deficits noted with functional tasks)  Lower Extremity Assessment  Lower Extremity Assessment Defer to PT evaluation  Cervical / Trunk Assessment  Cervical / Trunk Assessment Other exceptions  Cervical / Trunk Exceptions keeps head/neck rotated to Lt and laterally flexed to Lt.  Will move to midline with min facilitation.    ADL  Eating/Feeding Supervision/safety;Set up  Where Assessed - Eating/Feeding Chair;Bed level  Grooming Wash/dry hands;Wash/dry face;Brushing hair;Minimal assistance  Where Assessed - Grooming Supported sitting  Upper Body Bathing Minimal assistance  Where Assessed - Upper Body Bathing Supported sitting  Lower Body Bathing Maximal assistance  Where Assessed - Lower Body Bathing Supported sit to stand  Upper Body Dressing Moderate assistance  Where Assessed - Upper Body Dressing Unsupported sitting;Supported sitting  Lower Body Dressing +1 Total assistance  Where Assessed - Lower Body Dressing Supported sit to Leisure centre manager Method Sit to stand;Stand pivot  Musician - Clothing Manipulation and Hygiene +1 Total assistance  Where Assessed - Toileting Clothing Manipulation and Hygiene Sit to stand from 3-in-1 or toilet  Transfers/Ambulation Related to ADLs max A stand pivot to pt's Lt. Pt with difficulty pivoting to Lt.   ADL Comments Pt requires assist for balance, and cognitive deficits  Vision - History  Baseline Vision Wears glasses all the time  Patient Visual Report No change from baseline  Vision - Assessment  Eye Alignment [facility]  Vision Assessment Vision tested  Ocular Range of Motion [facility]  Tracking/Visual Pursuits Other (comment) (Pt loses fixation in all quadrants)  Visual Fields Other (comment) (pt with difficulty  participating in assessment)  Additional Comments Pt with Lt gaze preference  Perception  Perception Impaired  Inattention/Neglect Other (comment) (Pt with Lt. gaze preference)  Bed Mobility  Bed Mobility Supine to Sit;Sitting - Scoot to Edge of Bed  Supine to Sit 4: Min assist;With rails;HOB flat  Sitting - Scoot to [place] of Bed 4: Min guard  Details for Bed Mobility Assistance Pt requires step by step instruction and verbal cues and min A to lift trunk off bed  Transfers  Transfers Sit to Stand;Stand to Sit  Sit to Stand 2: Max assist;With upper extremity assist;From bed  Stand to Sit 2: Max assist;With upper extremity assist;To bed;To chair/3-in-1  Details for Transfer Assistance Required 3 attempts to achieve standing.  First two attempts, pt unable to fully stand upright - maintains hips and knees flexed. On third attempt moved into standing with assist to achieve hip extension - transferred to recliner  Balance  Balance Assessed Yes  Static Sitting Balance  Static Sitting - Balance Support Right upper extremity supported;Left upper extremity supported  Static Sitting - Level of Assistance 4: Min assist  Static Sitting - Comment/# of Minutes 7 mins- pt loses balance to Rt.   OT - End of Session  Activity Tolerance Patient tolerated treatment well  Patient left in chair;with call bell/phone within reach;with chair alarm set  Nurse Communication Mobility status  OT Assessment  OT Recommendation/Assessment Patient needs continued OT Services  OT Problem List Decreased activity tolerance;Impaired balance (sitting and/or standing);Decreased strength;Decreased cognition;Decreased safety awareness;Impaired vision/perception;Decreased knowledge of use of DME or AE  Barriers to Discharge Decreased caregiver support  OT Therapy Diagnosis  Generalized weakness;Cognitive deficits;Disturbance of vision  OT Plan  OT Frequency Min 2X/week  OT Treatment/Interventions Self-care/ADL  training;Neuromuscular education;Therapeutic activities;Visual/perceptual remediation/compensation;Cognitive remediation/compensation;Patient/family education;Balance training  OT Recommendation  Follow Up Recommendations SNF;Supervision/Assistance - 24 hour  OT Equipment None recommended by OT  Individuals Consulted  Consulted and Agree with Results and Recommendations Patient  Acute Rehab OT Goals  Patient Stated Goal To get better  OT Goal Formulation With patient  Time For Goal Achievement 04/05/13  Potential to Achieve Goals Good  OT Time Calculation  OT Start Time 1032  OT Stop Time 1101  OT Time Calculation (min) 29 min  OT General Charges  $OT Visit 1 Procedure  OT Evaluation  $Initial OT Evaluation Tier I 1 Procedure  OT Treatments  $Therapeutic Activity 23-37 mins  Written Expression  Dominant Hand Right   Jeani Hawking, OTR/L [phone]

## 2013-03-22 NOTE — Progress Notes (Signed)
CSW spoke to patient and patient's family by bedside about SNF option. Patient appears open to snf and asked social worker to let her think about it and will have an answer by this afternoon. Social worker explained that it was fine to think about it and talk it over with family, but encouraged patient to consider SNF due to patient needing max assist. CSW will update later this afternoon when patient decides on a snf.  Maree Krabbe, MSW, Theresia Majors [phone]

## 2013-03-22 NOTE — Evaluation (Signed)
Physical Therapy Evaluation Patient Details Name: Cristina Patton MRN: [id number] DOB: 11/06/1926 Today's Date: 03/22/2013 Time: 1221-1233 PT Time Calculation (min): 12 min  PT Assessment / Plan / Recommendation History of Present Illness  is a 79 y.o. female with history of hypertension presented to the ER after being found by housekeeping fully clothed lying in the bathtub in her own urine and feces for 24-48 hours. She was also confused and is unable to give any history secondary to same.  MRI showed  Small acute right inferior cerebellar infarct. No mass effect or  Clinical Impression  Pt admitted with above. Pt currently with functional limitations due to the deficits listed below (see PT Problem List).  Pt will benefit from skilled PT to increase their independence and safety with mobility to allow discharge to the venue listed below.       PT Assessment  Patient needs continued PT services    Follow Up Recommendations  SNF    Does the patient have the potential to tolerate intense rehabilitation      Barriers to Discharge Decreased caregiver support      Equipment Recommendations  Other (comment) (TBD)    Recommendations for Other Services     Frequency Min 3X/week    Precautions / Restrictions Precautions Precautions: Fall   Pertinent Vitals/Pain No c/o's      Mobility  Bed Mobility Bed Mobility: Sit to Supine Sit to Supine: 1: +2 Total assist Sit to Supine: Patient Percentage: 30% Details for Bed Mobility Assistance: Assist to lower trunk and to bring feet up. Transfers Transfers: Sit to Stand;Stand to Sit Sit to Stand: 1: +2 Total assist;From bed Sit to Stand: Patient Percentage: 30% Stand to Sit: 1: +2 Total assist;To bed Stand to Sit: Patient Percentage: 30% Details for Transfer Assistance: Heavy assist bringing hips up using bed pad.  Pt holding onto forearms of PT and tech.  Verbal/tactile cues to try and further extend knees, hips, and trunk.  Hips, knees, and trunk remained flexed. Ambulation/Gait Ambulation/Gait Assistance: Not tested (comment) Modified Rankin (Stroke Patients Only) Pre-Morbid Rankin Score: No symptoms Modified Rankin: Severe disability    Exercises     PT Diagnosis: Difficulty walking;Generalized weakness  PT Problem List: Decreased strength;Decreased activity tolerance;Decreased balance;Decreased mobility;Decreased cognition;Decreased knowledge of use of DME;Decreased safety awareness;Decreased knowledge of precautions PT Treatment Interventions: DME instruction;Gait training;Functional mobility training;Balance training;Therapeutic exercise;Therapeutic activities;Patient/family education     PT Goals(Current goals can be found in the care plan section) Acute Rehab PT Goals Patient Stated Goal: Did not state.  Agreeable to work toward incr mobility. PT Goal Formulation: With patient Time For Goal Achievement: 03/29/13 Potential to Achieve Goals: Fair  Visit Information  Last PT Received On: 03/22/13 Assistance Needed: +2 History of Present Illness: is a 79 y.o. female with history of hypertension presented to the ER after being found by housekeeping fully clothed lying in the bathtub in her own urine and feces for 24-48 hours. She was also confused and is unable to give any history secondary to same.  MRI showed  Small acute right inferior cerebellar infarct. No mass effect or       Prior Functioning  Home Living Family/patient expects to be discharged to:: Skilled nursing facility Living Arrangements: Alone Prior Function Level of Independence: Independent Comments: Pt was independent with driving and community activities PTA Communication Communication: No difficulties Dominant Hand: Right    Cognition  Cognition Arousal/Alertness: Awake/alert Behavior During Therapy: WFL for tasks assessed/performed Overall Cognitive Status: Impaired/Different  from baseline Area of Impairment:  Orientation;Attention;Safety/judgement;Awareness;Problem solving;Memory Orientation Level: Disoriented to;Situation Current Attention Level: Sustained Memory: Decreased short-term memory Safety/Judgement: Decreased awareness of deficits;Decreased awareness of safety Awareness: Intellectual Problem Solving: Slow processing;Difficulty sequencing;Requires verbal cues;Requires tactile cues    Extremity/Trunk Assessment Lower Extremity Assessment Lower Extremity Assessment: Generalized weakness   Balance Balance Balance Assessed: Yes Static Sitting Balance Static Sitting - Balance Support: Bilateral upper extremity supported Static Sitting - Level of Assistance: 4: Min assist Static Sitting - Comment/# of Minutes: Sat x 8 minutes. Posterior lean. Static Standing Balance Static Standing - Balance Support: Bilateral upper extremity supported Static Standing - Level of Assistance: 1: +2 Total assist  End of Session PT - End of Session Activity Tolerance: Patient limited by fatigue Patient left: in bed;with call bell/phone within reach;with bed alarm set;with family/visitor present Nurse Communication: Mobility status  GP     Lanette Ell 03/22/2013, 3:14 PM  [facility] PT [phone]

## 2013-03-22 NOTE — Progress Notes (Signed)
Pt has not voided since foley removal, bladder scan reveals 491 ml. Attempted to put pt on bedpan,pt refused saying"it's not time yet". NP on call paged, awaiting call back and orders. Will continue to monitor.    Cristina Patton M

## 2013-03-22 NOTE — Progress Notes (Signed)
Pt agreed to in and out cath. As previously noted bladder scan revealed 491 ml, in and out cath emptied 575 ml. Pt tolerated activity well. Will continue to monitor.    Cristina Patton M

## 2013-03-22 NOTE — Progress Notes (Signed)
Bladder scanned pt at 1800. Pt had 400 cc remaining in bladder at that time. Six hours post foley removal will be at 1930. Will have night shift bladder scan pt once more and follow protocol.   Genevive Bi, RN

## 2013-03-22 NOTE — Evaluation (Signed)
Clinical/Bedside Swallow Evaluation Patient Details  Name: Cristina Patton MRN: [id number] Date of Birth: Apr 25, 1927  Today's Date: 03/22/2013 Time: 0810-0822 SLP Time Calculation (min): 12 min  Past Medical History:  Past Medical History  Diagnosis Date  . Hypertension    Past Surgical History: History reviewed. No pertinent past surgical history. HPI:  79 y.o. female with history of hypertension presented to the ER after being found by housekeeping fully clothed lying in the bathtub in her own urine and feces for 24-48 hours. She was also confused and is unable to give any history secondary to same. According to her niece at the bedside, patient is single, lives alone, quite coherent and independent of activities of daily living. Help comes home 3 times a week to clean her yard in her house. S. In the ED, she was initially hypothermic but that resolved, sodium 146, creatinine 1.74 (baseline not available) AST 76, ALT 43, WBC 18.7, CK 2267, CT head and neck and chest x-ray without acute findings.    Assessment / Plan / Recommendation Clinical Impression  Pt. exhibited significant oral dysphagia with finding of copious residue throughout oral cavity, significant difficulty manipulating forming bolus and manipulation iwth verbal cues to expectorate.  Oral holding and piecemealing of boluses.  No overt indications of pharyngeal deficits (possible pharyngeal residue).  Behavioral and cognitive impairments impacting swallow function and required max verbal/visual/tactile cueing.  Recommend diet texture downgrade to Dys 1 and continue thin liquids, FULL supervision, small straw sips ok, pills crushed.  ST will continue to follow.     Aspiration Risk  Moderate    Diet Recommendation Dysphagia 1 (Puree);Thin liquid   Liquid Administration via: Cup;Straw Medication Administration: Crushed with puree Supervision: Patient able to self feed;Full supervision/cueing for compensatory  strategies Compensations: Slow rate;Small sips/bites;Check for pocketing Postural Changes and/or Swallow Maneuvers: Seated upright 90 degrees    Other  Recommendations Oral Care Recommendations: Oral care BID   Follow Up Recommendations  Skilled Nursing facility    Frequency and Duration min 2x/week  2 weeks   Pertinent Vitals/Pain WFL         Swallow Study         Oral/Motor/Sensory Function Overall Oral Motor/Sensory Function:  (not cooperative with exam, generally intact)   Ice Chips Ice chips: Not tested   Thin Liquid Thin Liquid: Impaired Presentation: Cup;Straw Oral Phase Impairments: Reduced lingual movement/coordination;Poor awareness of bolus Oral Phase Functional Implications: Oral holding;Prolonged oral transit;Oral residue;Right lateral sulci pocketing;Left lateral sulci pocketing    Nectar Thick Nectar Thick Liquid: Not tested   Honey Thick Honey Thick Liquid: Not tested   Puree Puree: Not tested   Solid   GO    Solid: Impaired Oral Phase Impairments: Poor awareness of bolus;Impaired anterior to posterior transit Oral Phase Functional Implications: Left anterior spillage;Right anterior spillage;Oral residue       Breck Coons Encarnacion Bole M.Ed [employer] [phone]  03/22/2013

## 2013-03-22 NOTE — Progress Notes (Signed)
[facility]                                                                                Patient Demographics  Cristina Patton, is a 79 y.o. female, DOB - 1926/11/23, ZOX:[id number]  Admit date - 03/20/2013   Admitting Physician Elease Etienne, MD  Outpatient Primary MD for the patient is No primary provider on file.  LOS - 2   Chief Complaint  Patient presents with  . Fall        Assessment & Plan    Dehydration with hypernatremia  - Secondary to poor oral intake from fall and altered mental status  - Hydrate with  IV fluids      Acute renal failure  - Secondary to dehydration and mild rhabdomyolysis  - Resolved post IVF     Mild rhabdomyolysis  - Secondary to fall and lying in bathtub for extended period of time, better with IVF     Acute encephalopathy + R.Cerebellar Isch CVA  - Likely secondary to acute medical illness: Dehydration, hypernatremia, acute renal failure  - since she had some right-sided neglect, in an MRI and MRA of the head which was positive for ischemic stroke. -She was taking baby aspirin at home so we have placed on Plavix, echogram is stable, carotid duplex is stable, she is being seen by PT OT and speech, A1c and lipid panel are pending. Neuro called.  Lab Results  Component Value Date   HGBA1C 5.7* 03/20/2013    No results found for this basename: CHOL   No results found for this basename: HDL   No results found for this basename: LDLCALC   No results found for this basename: TRIG   No results found for this basename: CHOLHDL   No results found for this basename: LDLDIRECT        Hypothermia  - Possibly cold exposure from lying in bathtub  - Resolved.      Leukocytosis  - Likely stress margination.  - She is afebrile, chest x-ray and UA stable we will monitor clinically     Uncontrolled hypertension  - Place her on Norvasc along with as needed IV hydralazine     Hypokalemia    replace and  monitor       Code Status: DO NOT RESUSCITATE  Family Communication:  Niece  Disposition Plan: To be decided likely SNF   Procedures CT scan head and C-spine, MRI Brain, carotid duplex ultrasound   Consults   Neuro   Medications  Scheduled Meds: . amLODipine  10 mg Oral Daily  . clopidogrel  75 mg Oral Daily  . enoxaparin (LOVENOX) injection  40 mg Subcutaneous Q24H  . feeding supplement (GLUCERNA SHAKE)  237 mL Oral BID BM  . sodium chloride  3 mL Intravenous Q12H  . tamsulosin  0.4 mg Oral Daily   Continuous Infusions:   PRN Meds:.acetaminophen, acetaminophen, albuterol, hydrALAZINE, ondansetron (ZOFRAN) IV, ondansetron  DVT Prophylaxis  Lovenox   Lab Results  Component Value Date   PLT 501* 03/22/2013    Antibiotics    Anti-infectives   None          Subjective:   Cristina  Patton today has, No headache, No chest pain, No abdominal pain - No Nausea, No new weakness tingling or numbness, No Cough - SOB.    Objective:   Filed Vitals:   03/21/13 0510 03/21/13 1346 03/21/13 1948 03/22/13 0502  BP: 153/72 155/73 158/77 164/80  Pulse: 100 98 100 94  Temp: 97.5 F (36.4 C) 98 F (36.7 C) 98 F (36.7 C) 98 F (36.7 C)  TempSrc: Oral Oral Oral Oral  Resp: 18 18 18 16   Weight:    58.106 kg (128 lb 1.6 oz)  SpO2: 97% 97% 97% 97%    Wt Readings from Last 3 Encounters:  03/22/13 58.106 kg (128 lb 1.6 oz)     Intake/Output Summary (Last 24 hours) at 03/22/13 1036 Last data filed at 03/22/13 0945  Gross per 24 hour  Intake   1000 ml  Output   1100 ml  Net   -100 ml    Exam She is somnolent but answers basic questions and follows basic commands,, No new F.N deficits, but some right-sided neglect  Chauncey.AT,PERRAL Supple Neck,No JVD, No cervical lymphadenopathy appriciated.  Symmetrical Chest wall movement, Good air movement bilaterally, CTAB RRR,No Gallops,Rubs or new Murmurs, No Parasternal Heave +ve B.Sounds, Abd Soft, Non tender, No  organomegaly appriciated, No rebound - guarding or rigidity. No Cyanosis, Clubbing or edema, No new Rash or bruise     Data Review   Micro Results No results found for this or any previous visit (from the past 240 hour(s)).  Radiology Reports Ct Head Wo Contrast  03/20/2013   CLINICAL DATA:  Question of fall.  EXAM: CT HEAD WITHOUT CONTRAST  CT CERVICAL SPINE WITHOUT CONTRAST  TECHNIQUE: Multidetector CT imaging of the head and cervical spine was performed following the standard protocol without intravenous contrast. Multiplanar CT image reconstructions of the cervical spine were also generated.  COMPARISON:  None.  FINDINGS: CT HEAD FINDINGS  No skull fracture or intracranial hemorrhage.  Small vessel disease type changes without CT evidence of large acute infarct.  Mild atrophy.  Vascular calcifications.  No intracranial mass lesion noted on this unenhanced exam.  CT CERVICAL SPINE FINDINGS  No cervical spine fracture.  Head tilt to left. Mild curvature cervical spine. No abnormal prevertebral soft tissue swelling. Question Mild edema posterior to the paraspinal musculature. If there is a high clinical suspicion of ligamentous injury, flexion and extension views or MR can be performed for further delineation.  Scarring lung apices.  IMPRESSION: No intracranial hemorrhage or skull fracture.  No cervical spine fracture detected.  Please see above.   Electronically Signed   By: Bridgett Larsson M.D.   On: 03/20/2013 14:40   Ct Cervical Spine Wo Contrast  03/20/2013   CLINICAL DATA:  Question of fall.  EXAM: CT HEAD WITHOUT CONTRAST  CT CERVICAL SPINE WITHOUT CONTRAST  TECHNIQUE: Multidetector CT imaging of the head and cervical spine was performed following the standard protocol without intravenous contrast. Multiplanar CT image reconstructions of the cervical spine were also generated.  COMPARISON:  None.  FINDINGS: CT HEAD FINDINGS  No skull fracture or intracranial hemorrhage.  Small vessel disease  type changes without CT evidence of large acute infarct.  Mild atrophy.  Vascular calcifications.  No intracranial mass lesion noted on this unenhanced exam.  CT CERVICAL SPINE FINDINGS  No cervical spine fracture.  Head tilt to left. Mild curvature cervical spine. No abnormal prevertebral soft tissue swelling. Question Mild edema posterior to the paraspinal musculature. If there is a  high clinical suspicion of ligamentous injury, flexion and extension views or MR can be performed for further delineation.  Scarring lung apices.  IMPRESSION: No intracranial hemorrhage or skull fracture.  No cervical spine fracture detected.  Please see above.   Electronically Signed   By: Bridgett Larsson M.D.   On: 03/20/2013 14:40   Dg Chest Port 1 View  03/20/2013   CLINICAL DATA:  Confusion and weakness.  EXAM: PORTABLE CHEST - 1 VIEW  COMPARISON:  01/13/2012.  FINDINGS: Mediastinum and hilar structures are normal. Stable elevation left hemidiaphragm present. Mild basilar atelectasis. Poor inspiration. Heart size normal. Pulmonary vascularity normal. No acute bony abnormality.  IMPRESSION: Poor inspiration. Mild atelectasis left lung base. Stable elevation left hemidiaphragm . No significant abnormality otherwise noted.   Electronically Signed   By: Maisie Fus  Register   On: 03/20/2013 14:18    CBC  Recent Labs Lab 03/20/13 1359 03/20/13 1416 03/21/13 0239 03/22/13 0515  WBC 18.7*  --  16.6* 13.1*  HGB 15.7* 16.7* 12.9 12.0  HCT 43.7 49.0* 36.7 35.0*  PLT PLATELET CLUMPS NOTED ON SMEAR, COUNT APPEARS ADEQUATE  --  579* 501*  MCV 79.9  --  80.0 81.6  MCH 28.7  --  28.1 28.0  MCHC 35.9  --  35.1 34.3  RDW 15.4  --  15.5 15.8*  LYMPHSABS 1.5  --   --   --   MONOABS 1.7*  --   --   --   EOSABS 0.0  --   --   --   BASOSABS 0.0  --   --   --     Chemistries   Recent Labs Lab 03/20/13 1400 03/20/13 1416 03/21/13 0239 03/21/13 0430 03/22/13 0515  NA 146* 149* 147*  --  149*  K 4.1 3.3* 2.7*  --  4.1  CL  104 111 110  --  118*  CO2 21  --  21  --  23  GLUCOSE 143* 145* 228*  --  114*  BUN 60* 53* 54*  --  34*  CREATININE 1.74* 2.00* 1.43*  --  0.89  CALCIUM 10.5  --  9.0  --  9.0  MG  --   --   --  2.5  --   AST 76*  --  52*  --   --   ALT 43*  --  33  --   --   ALKPHOS 58  --  44  --   --   BILITOT 0.9  --  0.8  --   --    ------------------------------------------------------------------------------------------------------------------ CrCl is unknown because there is no height on file for the current visit. ------------------------------------------------------------------------------------------------------------------  Recent Labs  03/20/13 2001  HGBA1C 5.7*   ------------------------------------------------------------------------------------------------------------------ No results found for this basename: CHOL, HDL, LDLCALC, TRIG, CHOLHDL, LDLDIRECT,  in the last 72 hours ------------------------------------------------------------------------------------------------------------------  Recent Labs  03/21/13 1405  TSH 0.426  T4TOTAL 6.0  T3FREE 2.0*   ------------------------------------------------------------------------------------------------------------------ No results found for this basename: VITAMINB12, FOLATE, FERRITIN, TIBC, IRON, RETICCTPCT,  in the last 72 hours  Coagulation profile No results found for this basename: INR, PROTIME,  in the last 168 hours  No results found for this basename: DDIMER,  in the last 72 hours  Cardiac Enzymes  Recent Labs Lab 03/20/13 1359 03/20/13 2001 03/21/13 0239  TROPONINI <0.30 <0.30 <0.30   ------------------------------------------------------------------------------------------------------------------ No components found with this basename: POCBNP,      Time Spent in minutes  40   Devery Murgia K M.D  on 03/22/2013 at 10:36 AM  Between 7am to 7pm - Pager - [phone]  After 7pm go to [email or web address] -  password [email or web address]  And look for the night coverage person covering for me after hours  [facility] Office  [phone]

## 2013-03-22 NOTE — Consult Note (Signed)
Referring Physician: Thedore Mins    Chief Complaint: stroke  HPI:                                                                                                                                         Cristina Patton is an 79 y.o. female with history of hypertension presented to the ER after being found by housekeeping fully clothed lying in the bathtub in her own urine and feces for 24-48 hours. She was also confused and is unable to give any history.  Per note, According to her niece at the bedside, patient is single, lives alone, quite coherent and independent of activities of daily living. Help comes home 3 times a week to clean her yard in her house. She was last seen  well on Saturday (4 days ago).  Initial CT head was negative, follow up MRI brain showed a small acute right inferior cerebellar infarct. Currently she does not give a good history but is aware of where she is, the date and year.  When asked how she was found or if she fell she simply stares at me and gives me no further information. Friends who are sitting around her state she is almost back to her baseline.    Date last known well: Date: 03/18/2013 Time last known well: Unable to determine tPA Given: No: out of the window  Past Medical History  Diagnosis Date  . Hypertension     History reviewed. No pertinent past surgical history.  History reviewed. No pertinent family history. Social History:  reports that she has never smoked. She does not have any smokeless tobacco history on file. She reports that she does not drink alcohol or use illicit drugs.  Allergies: No Known Allergies  Medications:                                                                                                                           Prior to Admission:  Prescriptions prior to admission  Medication Sig Dispense Refill  . aspirin 81 MG tablet Take 81 mg by mouth daily.       Scheduled: . amLODipine  10 mg Oral Daily  . clopidogrel   75 mg Oral Daily  . enoxaparin (LOVENOX) injection  40 mg Subcutaneous Q24H  . feeding supplement (GLUCERNA SHAKE)  237 mL Oral BID BM  . sodium  chloride  3 mL Intravenous Q12H  . tamsulosin  0.4 mg Oral Daily   Continuous:   ROS:                                                                                                                                       History obtained from friends who are present in room  General ROS: negative for - chills, fatigue, fever, night sweats, weight gain or weight loss Psychological ROS: negative for - behavioral disorder, hallucinations, memory difficulties, mood swings or suicidal ideation Ophthalmic ROS: negative for - blurry vision, double vision, eye pain or loss of vision ENT ROS: negative for - epistaxis, nasal discharge, oral lesions, sore throat, tinnitus or vertigo Allergy and Immunology ROS: negative for - hives or itchy/watery eyes Hematological and Lymphatic ROS: negative for - bleeding problems, bruising or swollen lymph nodes Endocrine ROS: negative for - galactorrhea, hair pattern changes, polydipsia/polyuria or temperature intolerance Respiratory ROS: negative for - cough, hemoptysis, shortness of breath or wheezing Cardiovascular ROS: negative for - chest pain, dyspnea on exertion, edema or irregular heartbeat Gastrointestinal ROS: negative for - abdominal pain, diarrhea, hematemesis, nausea/vomiting or stool incontinence Genito-Urinary ROS: negative for - dysuria, hematuria, incontinence or urinary frequency/urgency Musculoskeletal ROS: negative for - joint swelling or muscular weakness Neurological ROS: as noted in HPI Dermatological ROS: negative for rash and skin lesion changes  Neurologic Examination:                                                                                                      Blood pressure 164/80, pulse 94, temperature 98 F (36.7 C), temperature source Oral, resp. rate 16, weight 58.106 kg (128 lb 1.6  oz), SpO2 97.00%.   Mental Status: Alert, oriented, thought content appropriate.  Speech fluent without evidence of aphasia.  Able to follow 3 step commands without difficulty. Cranial Nerves: II: Discs flat bilaterally; Visual fields grossly normal, pupils equal, round, reactive to light and accommodation III,IV, VI: ptosis not present, extra-ocular motions intact bilaterally V,VII: smile symmetric, facial light touch sensation normal bilaterally VIII: hearing normal bilaterally IX,X: gag reflex present XI: bilateral shoulder shrug XII: midline tongue extension without atrophy or fasciculations  Motor: Right : Upper extremity   5/5 (4/5 tricep)  Left:     Upper extremity   5/5  Lower extremity   5/5     Lower extremity   5/5 Tone and bulk:normal tone throughout; no atrophy noted Sensory: Pinprick and light touch intact throughout,  bilaterally Deep Tendon Reflexes:  Right: Upper Extremity   Left: Upper extremity   biceps (C-5 to C-6) 2/4   biceps (C-5 to C-6) 2/4 tricep (C7) 2/4    triceps (C7) 2/4 Brachioradialis (C6) 2/4  Brachioradialis (C6) 2/4  Lower Extremity Lower Extremity  quadriceps (L-2 to L-4) 2/4   quadriceps (L-2 to L-4) 2/4 Achilles (S1) 0/4   Achilles (S1) 0/4  Plantars: Mute bilateral Cerebellar: normal finger-to-nose,  normal heel-to-shin test Gait: not assessed due to weakness. CV: pulses palpable throughout    Results for orders placed during the hospital encounter of 03/20/13 (from the past 48 hour(s))  CBC WITH DIFFERENTIAL     Status: Abnormal   Collection Time    03/20/13  1:59 PM      Result Value Range   WBC 18.7 (*) 4.0 - 10.5 K/uL   Comment: WHITE COUNT CONFIRMED ON SMEAR   RBC 5.47 (*) 3.87 - 5.11 MIL/uL   Hemoglobin 15.7 (*) 12.0 - 15.0 g/dL   HCT 11.9  14.7 - 82.9 %   MCV 79.9  78.0 - 100.0 fL   MCH 28.7  26.0 - 34.0 pg   MCHC 35.9  30.0 - 36.0 g/dL   RDW 56.2  13.0 - 86.5 %   Platelets    150 - 400 K/uL   Value: PLATELET CLUMPS  NOTED ON SMEAR, COUNT APPEARS ADEQUATE   Neutrophils Relative % 83 (*) 43 - 77 %   Lymphocytes Relative 8 (*) 12 - 46 %   Monocytes Relative 9  3 - 12 %   Eosinophils Relative 0  0 - 5 %   Basophils Relative 0  0 - 1 %   Neutro Abs 15.5 (*) 1.7 - 7.7 K/uL   Lymphs Abs 1.5  0.7 - 4.0 K/uL   Monocytes Absolute 1.7 (*) 0.1 - 1.0 K/uL   Eosinophils Absolute 0.0  0.0 - 0.7 K/uL   Basophils Absolute 0.0  0.0 - 0.1 K/uL   WBC Morphology MILD LEFT SHIFT (1-5% METAS, OCC MYELO, OCC BANDS)     Smear Review       Value: PLATELET CLUMPS NOTED ON SMEAR, COUNT APPEARS ADEQUATE  TROPONIN I     Status: None   Collection Time    03/20/13  1:59 PM      Result Value Range   Troponin I <0.30  <0.30 ng/mL   Comment:            Due to the release kinetics of cTnI,     a negative result within the first hours     of the onset of symptoms does not rule out     myocardial infarction with certainty.     If myocardial infarction is still suspected,     repeat the test at appropriate intervals.  CK     Status: Abnormal   Collection Time    03/20/13  1:59 PM      Result Value Range   Total CK 2267 (*) 7 - 177 U/L  COMPREHENSIVE METABOLIC PANEL     Status: Abnormal   Collection Time    03/20/13  2:00 PM      Result Value Range   Sodium 146 (*) 135 - 145 mEq/L   Potassium 4.1  3.5 - 5.1 mEq/L   Chloride 104  96 - 112 mEq/L   CO2 21  19 - 32 mEq/L   Glucose, Bld 143 (*) 70 - 99 mg/dL   BUN 60 (*)  6 - 23 mg/dL   Creatinine, Ser 1.61 (*) 0.50 - 1.10 mg/dL   Calcium 09.6  8.4 - 04.5 mg/dL   Total Protein 8.7 (*) 6.0 - 8.3 g/dL   Albumin 3.4 (*) 3.5 - 5.2 g/dL   AST 76 (*) 0 - 37 U/L   ALT 43 (*) 0 - 35 U/L   Alkaline Phosphatase 58  39 - 117 U/L   Total Bilirubin 0.9  0.3 - 1.2 mg/dL   GFR calc non Af Amer 26 (*) >90 mL/min   GFR calc Af Amer 30 (*) >90 mL/min   Comment: (NOTE)     The eGFR has been calculated using the CKD EPI equation.     This calculation has not been validated in all clinical  situations.     eGFR's persistently <90 mL/min signify possible Chronic Kidney     Disease.  CG4 I-STAT (LACTIC ACID)     Status: Abnormal   Collection Time    03/20/13  2:15 PM      Result Value Range   Lactic Acid, Venous 2.88 (*) 0.5 - 2.2 mmol/L  POCT I-STAT, CHEM 8     Status: Abnormal   Collection Time    03/20/13  2:16 PM      Result Value Range   Sodium 149 (*) 135 - 145 mEq/L   Potassium 3.3 (*) 3.5 - 5.1 mEq/L   Chloride 111  96 - 112 mEq/L   BUN 53 (*) 6 - 23 mg/dL   Creatinine, Ser 4.09 (*) 0.50 - 1.10 mg/dL   Glucose, Bld 811 (*) 70 - 99 mg/dL   Calcium, Ion 9.14  7.82 - 1.30 mmol/L   TCO2 23  0 - 100 mmol/L   Hemoglobin 16.7 (*) 12.0 - 15.0 g/dL   HCT 95.6 (*) 21.3 - 08.6 %  URINALYSIS, ROUTINE W REFLEX MICROSCOPIC     Status: Abnormal   Collection Time    03/20/13  4:28 PM      Result Value Range   Color, Urine AMBER (*) YELLOW   Comment: BIOCHEMICALS MAY BE AFFECTED BY COLOR   APPearance CLOUDY (*) CLEAR   Specific Gravity, Urine 1.023  1.005 - 1.030   pH 6.0  5.0 - 8.0   Glucose, UA NEGATIVE  NEGATIVE mg/dL   Hgb urine dipstick TRACE (*) NEGATIVE   Bilirubin Urine MODERATE (*) NEGATIVE   Ketones, ur 15 (*) NEGATIVE mg/dL   Protein, ur 578 (*) NEGATIVE mg/dL   Urobilinogen, UA 1.0  0.0 - 1.0 mg/dL   Nitrite NEGATIVE  NEGATIVE   Leukocytes, UA NEGATIVE  NEGATIVE  URINE MICROSCOPIC-ADD ON     Status: Abnormal   Collection Time    03/20/13  4:28 PM      Result Value Range   Squamous Epithelial / LPF RARE  RARE   WBC, UA 0-2  <3 WBC/hpf   RBC / HPF 0-2  <3 RBC/hpf   Bacteria, UA FEW (*) RARE   Casts HYALINE CASTS (*) NEGATIVE   Urine-Other MUCOUS PRESENT     Comment: AMORPHOUS URATES/PHOSPHATES  GLUCOSE, CAPILLARY     Status: Abnormal   Collection Time    03/20/13  5:45 PM      Result Value Range   Glucose-Capillary 132 (*) 70 - 99 mg/dL  TROPONIN I     Status: None   Collection Time    03/20/13  8:01 PM      Result Value Range   Troponin I <0.30   <  0.30 ng/mL   Comment:            Due to the release kinetics of cTnI,     a negative result within the first hours     of the onset of symptoms does not rule out     myocardial infarction with certainty.     If myocardial infarction is still suspected,     repeat the test at appropriate intervals.  TSH     Status: Abnormal   Collection Time    03/20/13  8:01 PM      Result Value Range   TSH 0.264 (*) 0.350 - 4.500 uIU/mL   Comment: Performed at [facility]  HEMOGLOBIN A1C     Status: Abnormal   Collection Time    03/20/13  8:01 PM      Result Value Range   Hemoglobin A1C 5.7 (*) <5.7 %   Comment: (NOTE)                                                                               According to the ADA Clinical Practice Recommendations for 2011, when     HbA1c is used as a screening test:      >=6.5%   Diagnostic of Diabetes Mellitus               (if abnormal result is confirmed)     5.7-6.4%   Increased risk of developing Diabetes Mellitus     References:Diagnosis and Classification of Diabetes Mellitus,Diabetes     Care,2011,34(Suppl 1):S62-S69 and Standards of Medical Care in             Diabetes - 2011,Diabetes Care,2011,34 (Suppl 1):S11-S61.   Mean Plasma Glucose 117 (*) <117 mg/dL   Comment: Performed at [facility]  GLUCOSE, CAPILLARY     Status: Abnormal   Collection Time    03/20/13  9:02 PM      Result Value Range   Glucose-Capillary 179 (*) 70 - 99 mg/dL  TROPONIN I     Status: None   Collection Time    03/21/13  2:39 AM      Result Value Range   Troponin I <0.30  <0.30 ng/mL   Comment:            Due to the release kinetics of cTnI,     a negative result within the first hours     of the onset of symptoms does not rule out     myocardial infarction with certainty.     If myocardial infarction is still suspected,     repeat the test at appropriate intervals.  COMPREHENSIVE METABOLIC PANEL     Status: Abnormal   Collection Time     03/21/13  2:39 AM      Result Value Range   Sodium 147 (*) 135 - 145 mEq/L   Potassium 2.7 (*) 3.5 - 5.1 mEq/L   Comment: CRITICAL RESULT CALLED TO, READ BACK BY AND VERIFIED WITH:     SHACKLETT M,RN 03/21/13 0357 WAYK   Chloride 110  96 - 112 mEq/L   CO2 21  19 - 32 mEq/L   Glucose, Bld 228 (*) 70 -  99 mg/dL   BUN 54 (*) 6 - 23 mg/dL   Creatinine, Ser 1.61 (*) 0.50 - 1.10 mg/dL   Calcium 9.0  8.4 - 09.6 mg/dL   Total Protein 7.1  6.0 - 8.3 g/dL   Albumin 2.7 (*) 3.5 - 5.2 g/dL   AST 52 (*) 0 - 37 U/L   ALT 33  0 - 35 U/L   Alkaline Phosphatase 44  39 - 117 U/L   Total Bilirubin 0.8  0.3 - 1.2 mg/dL   GFR calc non Af Amer 33 (*) >90 mL/min   GFR calc Af Amer 38 (*) >90 mL/min   Comment: (NOTE)     The eGFR has been calculated using the CKD EPI equation.     This calculation has not been validated in all clinical situations.     eGFR's persistently <90 mL/min signify possible Chronic Kidney     Disease.  CBC     Status: Abnormal   Collection Time    03/21/13  2:39 AM      Result Value Range   WBC 16.6 (*) 4.0 - 10.5 K/uL   RBC 4.59  3.87 - 5.11 MIL/uL   Hemoglobin 12.9  12.0 - 15.0 g/dL   Comment: REPEATED TO VERIFY     DELTA CHECK NOTED   HCT 36.7  36.0 - 46.0 %   MCV 80.0  78.0 - 100.0 fL   MCH 28.1  26.0 - 34.0 pg   MCHC 35.1  30.0 - 36.0 g/dL   RDW 04.5  40.9 - 81.1 %   Platelets 579 (*) 150 - 400 K/uL  CK     Status: Abnormal   Collection Time    03/21/13  2:39 AM      Result Value Range   Total CK 1252 (*) 7 - 177 U/L  MAGNESIUM     Status: None   Collection Time    03/21/13  4:30 AM      Result Value Range   Magnesium 2.5  1.5 - 2.5 mg/dL  GLUCOSE, CAPILLARY     Status: Abnormal   Collection Time    03/21/13  6:04 AM      Result Value Range   Glucose-Capillary 194 (*) 70 - 99 mg/dL  SODIUM, URINE, RANDOM     Status: None   Collection Time    03/21/13 10:55 AM      Result Value Range   Sodium, Ur 17    CREATININE, URINE, RANDOM     Status: None    Collection Time    03/21/13 10:55 AM      Result Value Range   Creatinine, Urine 153.34    GLUCOSE, CAPILLARY     Status: Abnormal   Collection Time    03/21/13 11:14 AM      Result Value Range   Glucose-Capillary 172 (*) 70 - 99 mg/dL  TSH     Status: None   Collection Time    03/21/13  2:05 PM      Result Value Range   TSH 0.426  0.350 - 4.500 uIU/mL   Comment: Performed at [facility]  T4     Status: None   Collection Time    03/21/13  2:05 PM      Result Value Range   T4, Total 6.0  5.0 - 12.5 ug/dL   Comment: Performed at [facility]  T3, FREE     Status: Abnormal   Collection Time  03/21/13  2:05 PM      Result Value Range   T3, Free 2.0 (*) 2.3 - 4.2 pg/mL   Comment: Performed at [facility]  GLUCOSE, CAPILLARY     Status: Abnormal   Collection Time    03/21/13  4:07 PM      Result Value Range   Glucose-Capillary 155 (*) 70 - 99 mg/dL  GLUCOSE, CAPILLARY     Status: Abnormal   Collection Time    03/21/13  9:35 PM      Result Value Range   Glucose-Capillary 116 (*) 70 - 99 mg/dL  BASIC METABOLIC PANEL     Status: Abnormal   Collection Time    03/22/13  5:15 AM      Result Value Range   Sodium 149 (*) 135 - 145 mEq/L   Potassium 4.1  3.5 - 5.1 mEq/L   Comment: DELTA CHECK NOTED   Chloride 118 (*) 96 - 112 mEq/L   CO2 23  19 - 32 mEq/L   Glucose, Bld 114 (*) 70 - 99 mg/dL   BUN 34 (*) 6 - 23 mg/dL   Comment: DELTA CHECK NOTED   Creatinine, Ser 0.89  0.50 - 1.10 mg/dL   Comment: DELTA CHECK NOTED   Calcium 9.0  8.4 - 10.5 mg/dL   GFR calc non Af Amer 58 (*) >90 mL/min   GFR calc Af Amer 67 (*) >90 mL/min   Comment: (NOTE)     The eGFR has been calculated using the CKD EPI equation.     This calculation has not been validated in all clinical situations.     eGFR's persistently <90 mL/min signify possible Chronic Kidney     Disease.  CBC     Status: Abnormal   Collection Time    03/22/13  5:15 AM      Result Value Range    WBC 13.1 (*) 4.0 - 10.5 K/uL   RBC 4.29  3.87 - 5.11 MIL/uL   Hemoglobin 12.0  12.0 - 15.0 g/dL   HCT 16.1 (*) 09.6 - 04.5 %   MCV 81.6  78.0 - 100.0 fL   MCH 28.0  26.0 - 34.0 pg   MCHC 34.3  30.0 - 36.0 g/dL   RDW 40.9 (*) 81.1 - 91.4 %   Platelets 501 (*) 150 - 400 K/uL  GLUCOSE, CAPILLARY     Status: Abnormal   Collection Time    03/22/13  6:19 AM      Result Value Range   Glucose-Capillary 101 (*) 70 - 99 mg/dL   Ct Head Wo Contrast  03/20/2013   CLINICAL DATA:  Question of fall.  EXAM: CT HEAD WITHOUT CONTRAST  CT CERVICAL SPINE WITHOUT CONTRAST  TECHNIQUE: Multidetector CT imaging of the head and cervical spine was performed following the standard protocol without intravenous contrast. Multiplanar CT image reconstructions of the cervical spine were also generated.  COMPARISON:  None.  FINDINGS: CT HEAD FINDINGS  No skull fracture or intracranial hemorrhage.  Small vessel disease type changes without CT evidence of large acute infarct.  Mild atrophy.  Vascular calcifications.  No intracranial mass lesion noted on this unenhanced exam.  CT CERVICAL SPINE FINDINGS  No cervical spine fracture.  Head tilt to left. Mild curvature cervical spine. No abnormal prevertebral soft tissue swelling. Question Mild edema posterior to the paraspinal musculature. If there is a high clinical suspicion of ligamentous injury, flexion and extension views or MR can be performed for further delineation.  Scarring lung apices.  IMPRESSION: No intracranial hemorrhage or skull fracture.  No cervical spine fracture detected.  Please see above.   Electronically Signed   By: Bridgett Larsson M.D.   On: 03/20/2013 14:40   Ct Cervical Spine Wo Contrast  03/20/2013   CLINICAL DATA:  Question of fall.  EXAM: CT HEAD WITHOUT CONTRAST  CT CERVICAL SPINE WITHOUT CONTRAST  TECHNIQUE: Multidetector CT imaging of the head and cervical spine was performed following the standard protocol without intravenous contrast. Multiplanar CT  image reconstructions of the cervical spine were also generated.  COMPARISON:  None.  FINDINGS: CT HEAD FINDINGS  No skull fracture or intracranial hemorrhage.  Small vessel disease type changes without CT evidence of large acute infarct.  Mild atrophy.  Vascular calcifications.  No intracranial mass lesion noted on this unenhanced exam.  CT CERVICAL SPINE FINDINGS  No cervical spine fracture.  Head tilt to left. Mild curvature cervical spine. No abnormal prevertebral soft tissue swelling. Question Mild edema posterior to the paraspinal musculature. If there is a high clinical suspicion of ligamentous injury, flexion and extension views or MR can be performed for further delineation.  Scarring lung apices.  IMPRESSION: No intracranial hemorrhage or skull fracture.  No cervical spine fracture detected.  Please see above.   Electronically Signed   By: Bridgett Larsson M.D.   On: 03/20/2013 14:40   Mr Shirlee Latch Wo Contrast  03/22/2013   CLINICAL DATA:  79 year old female with hypertension, found in a confused state with altered mental status. Initial encounter.  EXAM: MRI HEAD WITHOUT CONTRAST  MRA HEAD WITHOUT CONTRAST  TECHNIQUE: Multiplanar, multiecho pulse sequences of the brain and surrounding structures were obtained without intravenous contrast. Angiographic images of the head were obtained using MRA technique without contrast.  COMPARISON:  Head and cervical spine CT 03/20/2013.  FINDINGS: MRI HEAD FINDINGS  Cerebral volume is within normal limits for age. Major intracranial vascular flow voids are preserved. 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 Otherwise no midline shift, mass effect, or evidence of intracranial mass lesion.  There is a patchy sub cm area of restricted diffusion in the right inferior cerebellum (series 4, image 7). There is minimal associated T2 hyperintensity (series 5, image 6). No mass effect or hemorrhage.  No other restricted  diffusion in the brain. No ventriculomegaly, extra-axial collection or acute intracranial hemorrhage. Cervicomedullary junction and pituitary are within normal limits. Negative visualized cervical spine.  Mild for age periventricular white matter T2 and FLAIR hyperintensity in a nonspecific configuration. No supratentorial cortical encephalomalacia. Mild T2 heterogeneity in the deep gray matter nuclei, might all relate to perivascular spaces.  Visualized orbit soft tissues are within normal limits. Visualized paranasal sinuses and mastoids are clear. Normal bone marrow signal. Visualized scalp soft tissues are within normal limits.  MRA HEAD FINDINGS  Antegrade flow in the posterior circulation with dominant distal right vertebral artery. Suspect the left vertebral artery terminates in PICA. Mild distal right vertebral dolichoectasia. No proximal basilar artery stenosis.  There is distal basilar artery moderate stenosis just proximal to the SCA origins (series 7, image 57 and series 702, image 8). Still, the SCA and PCA origins are patent. The posterior communicating arteries are diminutive or absent.  There is moderate to severe bilateral PCA irregularity with bilateral P2 segment stenoses. There is bilateral preserved distal PCA flow.  Antegrade flow in both ICA siphons. Mild ICA irregularity and no significant ICA stenosis. Ophthalmic artery  origins are within normal limits. Patent carotid termini. MCA and ACA origins are within normal limits.  Anterior communicating artery is within normal limits. There is a moderate to severe stenosis of the left ACA A2 segment with preserved distal flow.  Both MCA M1 segments are patent with only mild irregularity. There is mild stenosis at both MCA bifurcations, but the sylvian branches remain patent. Moderate irregularity in the bilateral MCA branches with moderate to severe bilateral M2 stenoses (series 7, image 62, series 704, image 8).  IMPRESSION: 1. Small acute right  inferior cerebellar infarct. No mass effect or hemorrhage.  2. Otherwise mild for age nonspecific cerebral white matter signal changes.  3. MRA reveals moderate to severe intracranial medium vessel atherosclerosis, with moderate to severe bilateral P2, bilateral M2, and left A2 stenoses. There is preserved distal flow in these branches.  4. There is also a moderate distal basilar artery stenosis, just proximal to the SCA and PCA origins, and the posterior communicating arteries are diminutive or absent.   Electronically Signed   By: Augusto Gamble M.D.   On: 03/22/2013 10:28   Mr Brain Wo Contrast  03/22/2013   CLINICAL DATA:  79 year old female with hypertension, found in a confused state with altered mental status. Initial encounter.  EXAM: MRI HEAD WITHOUT CONTRAST  MRA HEAD WITHOUT CONTRAST  TECHNIQUE: Multiplanar, multiecho pulse sequences of the brain and surrounding structures were obtained without intravenous contrast. Angiographic images of the head were obtained using MRA technique without contrast.  COMPARISON:  Head and cervical spine CT 03/20/2013.  FINDINGS: MRI HEAD FINDINGS  Cerebral volume is within normal limits for age. Major intracranial vascular flow voids are preserved. 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 Otherwise no midline shift, mass effect, or evidence of intracranial mass lesion.  There is a patchy sub cm area of restricted diffusion in the right inferior cerebellum (series 4, image 7). There is minimal associated T2 hyperintensity (series 5, image 6). No mass effect or hemorrhage.  No other restricted diffusion in the brain. No ventriculomegaly, extra-axial collection or acute intracranial hemorrhage. Cervicomedullary junction and pituitary are within normal limits. Negative visualized cervical spine.  Mild for age periventricular white matter T2 and FLAIR hyperintensity in a nonspecific configuration. No supratentorial  cortical encephalomalacia. Mild T2 heterogeneity in the deep gray matter nuclei, might all relate to perivascular spaces.  Visualized orbit soft tissues are within normal limits. Visualized paranasal sinuses and mastoids are clear. Normal bone marrow signal. Visualized scalp soft tissues are within normal limits.  MRA HEAD FINDINGS  Antegrade flow in the posterior circulation with dominant distal right vertebral artery. Suspect the left vertebral artery terminates in PICA. Mild distal right vertebral dolichoectasia. No proximal basilar artery stenosis.  There is distal basilar artery moderate stenosis just proximal to the SCA origins (series 7, image 57 and series 702, image 8). Still, the SCA and PCA origins are patent. The posterior communicating arteries are diminutive or absent.  There is moderate to severe bilateral PCA irregularity with bilateral P2 segment stenoses. There is bilateral preserved distal PCA flow.  Antegrade flow in both ICA siphons. Mild ICA irregularity and no significant ICA stenosis. Ophthalmic artery origins are within normal limits. Patent carotid termini. MCA and ACA origins are within normal limits.  Anterior communicating artery is within normal limits. There is a moderate to severe stenosis of the left ACA A2 segment with preserved distal flow.  Both MCA M1 segments are  patent with only mild irregularity. There is mild stenosis at both MCA bifurcations, but the sylvian branches remain patent. Moderate irregularity in the bilateral MCA branches with moderate to severe bilateral M2 stenoses (series 7, image 62, series 704, image 8).  IMPRESSION: 1. Small acute right inferior cerebellar infarct. No mass effect or hemorrhage.  2. Otherwise mild for age nonspecific cerebral white matter signal changes.  3. MRA reveals moderate to severe intracranial medium vessel atherosclerosis, with moderate to severe bilateral P2, bilateral M2, and left A2 stenoses. There is preserved distal flow in  these branches.  4. There is also a moderate distal basilar artery stenosis, just proximal to the SCA and PCA origins, and the posterior communicating arteries are diminutive or absent.   Electronically Signed   By: Augusto Gamble M.D.   On: 03/22/2013 10:28   Dg Chest Port 1 View  03/20/2013   CLINICAL DATA:  Confusion and weakness.  EXAM: PORTABLE CHEST - 1 VIEW  COMPARISON:  01/13/2012.  FINDINGS: Mediastinum and hilar structures are normal. Stable elevation left hemidiaphragm present. Mild basilar atelectasis. Poor inspiration. Heart size normal. Pulmonary vascularity normal. No acute bony abnormality.  IMPRESSION: Poor inspiration. Mild atelectasis left lung base. Stable elevation left hemidiaphragm . No significant abnormality otherwise noted.   Electronically Signed   By: Maisie Fus  Register   On: 03/20/2013 14:18   2 D echo: Study Conclusions  - Procedure narrative: Transthoracic echocardiography. Image quality was adequate. The study was technically difficult. - Left ventricle: The cavity size was normal. Wall thickness was increased in a pattern of mild LVH. Systolic function was normal. The estimated ejection fraction was in the range of 60% to 65%. Wall motion was normal; there were no regional wall motion abnormalities. There was an increased relative contribution of atrial contraction to ventricular filling. - Mitral valve: Moderately to severely calcified annulus. Moderately thickened leaflets . - Pulmonary arteries: PA peak pressure: 36mm Hg (S). Transthoracic echocardiography. M-mode, complete 2D, spectral Doppler, and color Doppler. Weight: Weight: 57.3kg. Weight: 126.1lb. Blood pressure: 153/72. Patient status: Inpatient. Location: Echo laboratory.   Carotid Doppler: Summary:  - Mild technical difficulty due to high bifurcations and tortuosity. - Bilateral - 1% to 39% ICA stenosis. Vertebral artery flow is antegrade. - Bilateral - Doppler waveforms are mildly spiked  in appearance with no evidence of significant plaque.   Lipid panel and A1c pending  Assessment and plan discussed with with attending physician and they are in agreement.    Felicie Morn PA-C [facility] [phone]  03/22/2013, 10:45 AM   Assessment: 79 y.o. female with acute right cerebellar infarct in the setting of diffuse intracranial atherosclerosis.  There is a focal stenosis at the distal end of basilar artery (however this could be over estimated by MRA).  Patient was on ASA prior to admission and changed to Plavix.  Echo and carotid doppler are normal.   Stroke Risk Factors - hypertension  Recommend: 1) Continue Plavix 2) Lipid and A1c pending 3) Stroke team to follow in AM and make further decision on CTA and neruo imaging.    Patient seen and examined together with physician assistant and I concur with the assessment and plan.  Wyatt Portela, MD

## 2013-03-23 DIAGNOSIS — I6350 Cerebral infarction due to unspecified occlusion or stenosis of unspecified cerebral artery: Secondary | ICD-10-CM

## 2013-03-23 LAB — CBC
HCT: 32.7 % — ABNORMAL LOW (ref 36.0–46.0)
Hemoglobin: 10.9 g/dL — ABNORMAL LOW (ref 12.0–15.0)
MCH: 27.7 pg (ref 26.0–34.0)
MCHC: 33.3 g/dL (ref 30.0–36.0)
MCV: 83 fL (ref 78.0–100.0)
RBC: 3.94 MIL/uL (ref 3.87–5.11)
RDW: 16.1 % — ABNORMAL HIGH (ref 11.5–15.5)

## 2013-03-23 LAB — BASIC METABOLIC PANEL
BUN: 24 mg/dL — ABNORMAL HIGH (ref 6–23)
CO2: 25 mEq/L (ref 19–32)
Chloride: 115 mEq/L — ABNORMAL HIGH (ref 96–112)
Creatinine, Ser: 0.78 mg/dL (ref 0.50–1.10)
GFR calc Af Amer: 87 mL/min — ABNORMAL LOW (ref >90)
GFR calc non Af Amer: 75 mL/min — ABNORMAL LOW (ref >90)
Glucose, Bld: 107 mg/dL — ABNORMAL HIGH (ref 70–99)
Potassium: 3.7 mEq/L (ref 3.5–5.1)

## 2013-03-23 LAB — HEMOGLOBIN A1C: Mean Plasma Glucose: 117 mg/dL — ABNORMAL HIGH (ref <117)

## 2013-03-23 LAB — GLUCOSE, CAPILLARY: Glucose-Capillary: 138 mg/dL — ABNORMAL HIGH (ref 70–99)

## 2013-03-23 MED ORDER — GLUCERNA SHAKE PO LIQD: 237.0000 mL | Freq: Two times a day (BID) | ORAL | Status: DC

## 2013-03-23 MED ORDER — TAMSULOSIN HCL 0.4 MG PO CAPS: 0.4000 mg | ORAL_CAPSULE | Freq: Every day | ORAL | Status: DC

## 2013-03-23 MED ORDER — CLOPIDOGREL BISULFATE 75 MG PO TABS: 75.0000 mg | ORAL_TABLET | Freq: Every day | ORAL | Status: DC

## 2013-03-23 MED ORDER — AMLODIPINE BESYLATE 10 MG PO TABS: 10.0000 mg | ORAL_TABLET | Freq: Every day | ORAL | Status: DC

## 2013-03-23 MED ORDER — SODIUM CHLORIDE 0.9 % IV SOLN
INTRAVENOUS | Status: DC
  Administered 2013-03-23: 75 mL/h via INTRAVENOUS

## 2013-03-23 NOTE — Discharge Summary (Signed)
[facility]                                                                                   Cristina Patton, is a 79 y.o. female  DOB August 03, 1926  MRN [id number].  Admission date:  03/20/2013  Admitting Physician  Elease Etienne, MD  Discharge Date:  03/23/2013   Primary MD  No primary provider on file.  Recommendations for primary care physician for things to follow:      Admission Diagnosis  Rhabdomyolysis [728.88] Dehydration [276.51]  Discharge Diagnosis  CVA  Principal Problem:   Dehydration Active Problems:   Acute renal failure   Leukocytosis, unspecified   Acute encephalopathy   Fall at home- ? syncope.   Hypernatremia   HTN (hypertension)   Hypothermia   Transaminitis   Protein-calorie malnutrition, severe      Past Medical History  Diagnosis Date  . Hypertension     History reviewed. No pertinent past surgical history.   Discharge Condition: stable   Follow-up Information   Follow up with PCP. Schedule an appointment as soon as possible for a visit in 1 week.      Follow up with Mena Goes Lowella Petties, MD. Schedule an appointment as soon as possible for a visit in 3 days. (Urinary retention)    Specialty:  Urology   Contact information:   [address] [place] [address] [phone]       Follow up with Gates Rigg, MD. Schedule an appointment as soon as possible for a visit in 1 week.   Specialties:  Neurology, Radiology   Contact information:   [address] [place] [address] [phone]         Consults obtained - Neurology   Discharge Medications      Medication List    STOP taking these medications       aspirin 81 MG tablet      TAKE these medications       amLODipine 10 MG tablet  Commonly known as:  NORVASC  Take 1 tablet (10 mg total) by mouth daily.     clopidogrel 75 MG tablet  Commonly known as:  PLAVIX  Take 1 tablet (75 mg total) by mouth daily.      feeding supplement (GLUCERNA SHAKE) Liqd  Take 237 mLs by mouth 2 (two) times daily between meals.     tamsulosin 0.4 MG Caps capsule  Commonly known as:  FLOMAX  Take 1 capsule (0.4 mg total) by mouth daily.         Diet and Activity recommendation: See Discharge Instructions below   Discharge Instructions     Follow with Primary MD or SNF MD in 7 days   Get CBC, CMP, checked 7 days by Primary MD and again as instructed by your Primary MD.    Get Medicines reviewed and adjusted.  Please request your Prim.MD to go over all Hospital Tests and Procedure/Radiological results at the follow up, please get all Hospital records sent to your Prim MD by signing hospital release before you go home.  Activity: As tolerated with Full fall precautions use walker/cane &  assistance as needed   Diet:  Heart healthy diet with full assistance and aspiration precautions  For Heart failure patients - Check your Weight same time everyday, if you gain over 2 pounds, or you develop in leg swelling, experience more shortness of breath or chest pain, call your Primary MD immediately. Follow Cardiac Low Salt Diet and 1.8 lit/day fluid restriction.  Disposition SNF  If you experience worsening of your admission symptoms, develop shortness of breath, life threatening emergency, suicidal or homicidal thoughts you must seek medical attention immediately by calling 911 or calling your MD immediately  if symptoms less severe.  You Must read complete instructions/literature along with all the possible adverse reactions/side effects for all the Medicines you take and that have been prescribed to you. Take any new Medicines after you have completely understood and accpet all the possible adverse reactions/side effects.   Do not drive and provide baby sitting services if your were admitted for syncope or siezures until you have seen by Primary MD or a Neurologist and advised to do so again.  Do not drive when  taking Pain medications.    Do not take more than prescribed Pain, Sleep and Anxiety Medications  Special Instructions: If you have smoked or chewed Tobacco  in the last 2 yrs please stop smoking, stop any regular Alcohol  and or any Recreational drug use.  Wear Seat belts while driving.   Please note  You were cared for by a hospitalist during your hospital stay. If you have any questions about your discharge medications or the care you received while you were in the hospital after you are discharged, you can call the unit and asked to speak with the hospitalist on call if the hospitalist that took care of you is not available. Once you are discharged, your primary care physician will handle any further medical issues. Please note that NO REFILLS for any discharge medications will be authorized once you are discharged, as it is imperative that you return to your primary care physician (or establish a relationship with a primary care physician if you do not have one) for your aftercare needs so that they can reassess your need for medications and monitor your lab values.     Major procedures and Radiology Reports - PLEASE review detailed and final reports for all details, in brief -       Ct Head Wo Contrast  03/20/2013   CLINICAL DATA:  Question of fall.  EXAM: CT HEAD WITHOUT CONTRAST  CT CERVICAL SPINE WITHOUT CONTRAST  TECHNIQUE: Multidetector CT imaging of the head and cervical spine was performed following the standard protocol without intravenous contrast. Multiplanar CT image reconstructions of the cervical spine were also generated.  COMPARISON:  None.  FINDINGS: CT HEAD FINDINGS  No skull fracture or intracranial hemorrhage.  Small vessel disease type changes without CT evidence of large acute infarct.  Mild atrophy.  Vascular calcifications.  No intracranial mass lesion noted on this unenhanced exam.  CT CERVICAL SPINE FINDINGS  No cervical spine fracture.  Head tilt to left. Mild  curvature cervical spine. No abnormal prevertebral soft tissue swelling. Question Mild edema posterior to the paraspinal musculature. If there is a high clinical suspicion of ligamentous injury, flexion and extension views or MR can be performed for further delineation.  Scarring lung apices.  IMPRESSION: No intracranial hemorrhage or skull fracture.  No cervical spine fracture detected.  Please see above.   Electronically Signed   By: Bridgett Larsson  M.D.   On: 03/20/2013 14:40   Ct Cervical Spine Wo Contrast  03/20/2013   CLINICAL DATA:  Question of fall.  EXAM: CT HEAD WITHOUT CONTRAST  CT CERVICAL SPINE WITHOUT CONTRAST  TECHNIQUE: Multidetector CT imaging of the head and cervical spine was performed following the standard protocol without intravenous contrast. Multiplanar CT image reconstructions of the cervical spine were also generated.  COMPARISON:  None.  FINDINGS: CT HEAD FINDINGS  No skull fracture or intracranial hemorrhage.  Small vessel disease type changes without CT evidence of large acute infarct.  Mild atrophy.  Vascular calcifications.  No intracranial mass lesion noted on this unenhanced exam.  CT CERVICAL SPINE FINDINGS  No cervical spine fracture.  Head tilt to left. Mild curvature cervical spine. No abnormal prevertebral soft tissue swelling. Question Mild edema posterior to the paraspinal musculature. If there is a high clinical suspicion of ligamentous injury, flexion and extension views or MR can be performed for further delineation.  Scarring lung apices.  IMPRESSION: No intracranial hemorrhage or skull fracture.  No cervical spine fracture detected.  Please see above.   Electronically Signed   By: Bridgett Larsson M.D.   On: 03/20/2013 14:40   Mr Shirlee Latch Wo Contrast  03/22/2013   CLINICAL DATA:  79 year old female with hypertension, found in a confused state with altered mental status. Initial encounter.  EXAM: MRI HEAD WITHOUT CONTRAST  MRA HEAD WITHOUT CONTRAST  TECHNIQUE:  Multiplanar, multiecho pulse sequences of the brain and surrounding structures were obtained without intravenous contrast. Angiographic images of the head were obtained using MRA technique without contrast.  COMPARISON:  Head and cervical spine CT 03/20/2013.  FINDINGS: MRI HEAD FINDINGS  Cerebral volume is within normal limits for age. Major intracranial vascular flow voids are preserved. 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 Otherwise no midline shift, mass effect, or evidence of intracranial mass lesion.  There is a patchy sub cm area of restricted diffusion in the right inferior cerebellum (series 4, image 7). There is minimal associated T2 hyperintensity (series 5, image 6). No mass effect or hemorrhage.  No other restricted diffusion in the brain. No ventriculomegaly, extra-axial collection or acute intracranial hemorrhage. Cervicomedullary junction and pituitary are within normal limits. Negative visualized cervical spine.  Mild for age periventricular white matter T2 and FLAIR hyperintensity in a nonspecific configuration. No supratentorial cortical encephalomalacia. Mild T2 heterogeneity in the deep gray matter nuclei, might all relate to perivascular spaces.  Visualized orbit soft tissues are within normal limits. Visualized paranasal sinuses and mastoids are clear. Normal bone marrow signal. Visualized scalp soft tissues are within normal limits.  MRA HEAD FINDINGS  Antegrade flow in the posterior circulation with dominant distal right vertebral artery. Suspect the left vertebral artery terminates in PICA. Mild distal right vertebral dolichoectasia. No proximal basilar artery stenosis.  There is distal basilar artery moderate stenosis just proximal to the SCA origins (series 7, image 57 and series 702, image 8). Still, the SCA and PCA origins are patent. The posterior communicating arteries are diminutive or absent.  There is moderate to severe  bilateral PCA irregularity with bilateral P2 segment stenoses. There is bilateral preserved distal PCA flow.  Antegrade flow in both ICA siphons. Mild ICA irregularity and no significant ICA stenosis. Ophthalmic artery origins are within normal limits. Patent carotid termini. MCA and ACA origins are within normal limits.  Anterior communicating artery is within normal limits. There is a moderate to severe stenosis  of the left ACA A2 segment with preserved distal flow.  Both MCA M1 segments are patent with only mild irregularity. There is mild stenosis at both MCA bifurcations, but the sylvian branches remain patent. Moderate irregularity in the bilateral MCA branches with moderate to severe bilateral M2 stenoses (series 7, image 62, series 704, image 8).  IMPRESSION: 1. Small acute right inferior cerebellar infarct. No mass effect or hemorrhage.  2. Otherwise mild for age nonspecific cerebral white matter signal changes.  3. MRA reveals moderate to severe intracranial medium vessel atherosclerosis, with moderate to severe bilateral P2, bilateral M2, and left A2 stenoses. There is preserved distal flow in these branches.  4. There is also a moderate distal basilar artery stenosis, just proximal to the SCA and PCA origins, and the posterior communicating arteries are diminutive or absent.   Electronically Signed   By: Augusto Gamble M.D.   On: 03/22/2013 10:28   Mr Brain Wo Contrast  03/22/2013   CLINICAL DATA:  79 year old female with hypertension, found in a confused state with altered mental status. Initial encounter.  EXAM: MRI HEAD WITHOUT CONTRAST  MRA HEAD WITHOUT CONTRAST  TECHNIQUE: Multiplanar, multiecho pulse sequences of the brain and surrounding structures were obtained without intravenous contrast. Angiographic images of the head were obtained using MRA technique without contrast.  COMPARISON:  Head and cervical spine CT 03/20/2013.  FINDINGS: MRI HEAD FINDINGS  Cerebral volume is within normal limits for  age. Major intracranial vascular flow voids are preserved. 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 Otherwise no midline shift, mass effect, or evidence of intracranial mass lesion.  There is a patchy sub cm area of restricted diffusion in the right inferior cerebellum (series 4, image 7). There is minimal associated T2 hyperintensity (series 5, image 6). No mass effect or hemorrhage.  No other restricted diffusion in the brain. No ventriculomegaly, extra-axial collection or acute intracranial hemorrhage. Cervicomedullary junction and pituitary are within normal limits. Negative visualized cervical spine.  Mild for age periventricular white matter T2 and FLAIR hyperintensity in a nonspecific configuration. No supratentorial cortical encephalomalacia. Mild T2 heterogeneity in the deep gray matter nuclei, might all relate to perivascular spaces.  Visualized orbit soft tissues are within normal limits. Visualized paranasal sinuses and mastoids are clear. Normal bone marrow signal. Visualized scalp soft tissues are within normal limits.  MRA HEAD FINDINGS  Antegrade flow in the posterior circulation with dominant distal right vertebral artery. Suspect the left vertebral artery terminates in PICA. Mild distal right vertebral dolichoectasia. No proximal basilar artery stenosis.  There is distal basilar artery moderate stenosis just proximal to the SCA origins (series 7, image 57 and series 702, image 8). Still, the SCA and PCA origins are patent. The posterior communicating arteries are diminutive or absent.  There is moderate to severe bilateral PCA irregularity with bilateral P2 segment stenoses. There is bilateral preserved distal PCA flow.  Antegrade flow in both ICA siphons. Mild ICA irregularity and no significant ICA stenosis. Ophthalmic artery origins are within normal limits. Patent carotid termini. MCA and ACA origins are within normal limits.  Anterior  communicating artery is within normal limits. There is a moderate to severe stenosis of the left ACA A2 segment with preserved distal flow.  Both MCA M1 segments are patent with only mild irregularity. There is mild stenosis at both MCA bifurcations, but the sylvian branches remain patent. Moderate irregularity in the bilateral MCA branches with moderate to severe bilateral  M2 stenoses (series 7, image 62, series 704, image 8).  IMPRESSION: 1. Small acute right inferior cerebellar infarct. No mass effect or hemorrhage.  2. Otherwise mild for age nonspecific cerebral white matter signal changes.  3. MRA reveals moderate to severe intracranial medium vessel atherosclerosis, with moderate to severe bilateral P2, bilateral M2, and left A2 stenoses. There is preserved distal flow in these branches.  4. There is also a moderate distal basilar artery stenosis, just proximal to the SCA and PCA origins, and the posterior communicating arteries are diminutive or absent.   Electronically Signed   By: Augusto Gamble M.D.   On: 03/22/2013 10:28   Dg Chest Port 1 View  03/20/2013   CLINICAL DATA:  Confusion and weakness.  EXAM: PORTABLE CHEST - 1 VIEW  COMPARISON:  01/13/2012.  FINDINGS: Mediastinum and hilar structures are normal. Stable elevation left hemidiaphragm present. Mild basilar atelectasis. Poor inspiration. Heart size normal. Pulmonary vascularity normal. No acute bony abnormality.  IMPRESSION: Poor inspiration. Mild atelectasis left lung base. Stable elevation left hemidiaphragm . No significant abnormality otherwise noted.   Electronically Signed   By: Maisie Fus  Register   On: 03/20/2013 14:18    Micro Results      No results found for this or any previous visit (from the past 240 hour(s)).   History of present illness and  Hospital Course:     Kindly see H&P for history of present illness and admission details, please review complete Labs, Consult reports and Test reports for all details in brief Cristina Patton, is a 79 y.o. female, patient with history of hypertension but on no medications whatsoever except baby aspirin a day, who lived alone at home and was brought into the hospital after she incurred a fall and was found laying in her bathtub, apparently patient was in her bathtub for over a day and was unable to get out of it. When she arrived in the hospital her workup was consistent with dehydration, hyponatremia, mild rhabdomyolysis with acute renal failure.     She had some localizing symptoms for which further workup was done which showed a small right cerebellar ischemic CVA, she was seen by neurology, she underwent echo gram, carotid duplex, evaluation by PT-OT-speech, she was placed on Plavix and aspirin was stopped. Her A1c and lipid panel were stable. I discussed her case with neurologist on call Dr. Pearlean Brownie in detail who has cleared her for discharge with outpatient followup with neurology without any further workup at this point. She now desires generalized weakness is at her baseline, she initially had metabolic encephalopathy which is now completely resolved.     For her hypertension she has been placed on Norvasc.      The patient did develop urinary retention during her hospital stay for which she has been started on Flomax and Foley catheter has been placed, we tried removing the Foley catheter but she subsequently developed urinary retention again, at this time we will leave the Foley catheter in for at least a week with outpatient urology followup.   Her rhabdomyolysis, dehydration, hypernatremia and acute renal failure has resolved with IV fluids. She had mild reaction leukocytosis which is trending down. She was afebrile, chest x-ray and UA were unremarkable.       Today   Subjective:   Jakeya Gherardi today has no headache,no chest abdominal pain,no new weakness tingling or numbness, feels much better wants to go home today.    Objective:   Blood  pressure  145/74, pulse 95, temperature 98 F (36.7 C), temperature source Oral, resp. rate 18, weight 59 kg (130 lb 1.1 oz), SpO2 96.00%.   Intake/Output Summary (Last 24 hours) at 03/23/13 1036 Last data filed at 03/22/13 1422  Gross per 24 hour  Intake    123 ml  Output    400 ml  Net   -277 ml    Exam Awake Alert, Oriented *3, No new F.N deficits, Normal affect [place].AT,PERRAL Supple Neck,No JVD, No cervical lymphadenopathy appriciated.  Symmetrical Chest wall movement, Good air movement bilaterally, CTAB RRR,No Gallops,Rubs or new Murmurs, No Parasternal Heave +ve B.Sounds, Abd Soft, Non tender, No organomegaly appriciated, No rebound -guarding or rigidity. No Cyanosis, Clubbing or edema, No new Rash or bruise     Data Review     CBC w Diff: Lab Results  Component Value Date   WBC 12.5* 03/23/2013   HGB 10.9* 03/23/2013   HCT 32.7* 03/23/2013   PLT 433* 03/23/2013   LYMPHOPCT 8* 03/20/2013   MONOPCT 9 03/20/2013   EOSPCT 0 03/20/2013   BASOPCT 0 03/20/2013    CMP: Lab Results  Component Value Date   NA 147* 03/23/2013   K 3.7 03/23/2013   CL 115* 03/23/2013   CO2 25 03/23/2013   BUN 24* 03/23/2013   CREATININE 0.78 03/23/2013   PROT 7.1 03/21/2013   ALBUMIN 2.7* 03/21/2013   BILITOT 0.8 03/21/2013   ALKPHOS 44 03/21/2013   AST 52* 03/21/2013   ALT 33 03/21/2013  .  Lab Results  Component Value Date   HGBA1C 5.7* 03/20/2013    Lab Results  Component Value Date   CHOL 160 03/22/2013   HDL 61 03/22/2013   LDLCALC 79 03/22/2013   TRIG 100 03/22/2013   CHOLHDL 2.6 03/22/2013       Total Time in preparing paper work, data evaluation and todays exam - 35 minutes  Leroy Sea M.D on 03/23/2013 at 10:36 AM  [facility] Office  [phone]

## 2013-03-23 NOTE — Progress Notes (Signed)
Pt bladder scanned again, bladder scan revealed 334. Pt is in no distress, will not cath at this time. Will pass this info onto day shift nurse. Will continue to monitor.     [name]

## 2013-03-23 NOTE — Progress Notes (Signed)
SLP Cancellation Note  Patient Details Name: Cristina Patton MRN: [id number] DOB: 11-Mar-1927   Cancelled treatment:  Pt to D/C to [facility] today - rec SLP f/u there for dysphagia         [name] Laurice 03/23/2013, 3:19 PM

## 2013-03-23 NOTE — Progress Notes (Signed)
Stroke Team Progress Note  HISTORY Cristina Patton is an 79 y.o. female with history of hypertension presented to the ER after being found by housekeeping fully clothed lying in the bathtub in her own urine and feces for 24-48 hours. She was also confused and is unable to give any history. Per note, According to her niece at the bedside, patient is single, lives alone, quite coherent and independent of activities of daily living. Help comes home 3 times a week to clean her yard and her house. She was last seen well on Saturday (4 days ago). Initial CT head was negative, follow up MRI brain showed a small acute right inferior cerebellar infarct. Currently she does not give a good history but is aware of where she is, the date and year. When asked how she was found or if she fell she simply stares on initial exam and gives no further information. Friends who are sitting around her state she is almost back to her baseline.   Date last known well: Date: 03/18/2013  Time last known well: Unable to determine  tPA Given: No: out of the window   Patient was not a TPA candidate secondary to out of window. She was admitted for further evaluation and treatment.  SUBJECTIVE She is lying in bed.  Overall she feels her condition is gradually improving.   OBJECTIVE Most recent Vital Signs: Filed Vitals:   03/22/13 1052 03/22/13 1421 03/22/13 2103 03/23/13 0420  BP: 163/78 157/79 150/74 145/74  Pulse: 96 99 98 95  Temp:  98.9 F (37.2 C) 99.6 F (37.6 C) 98 F (36.7 C)  TempSrc:  Oral Oral Oral  Resp:  18 18 18   Weight:    59 kg (130 lb 1.1 oz)  SpO2:  96% 97% 96%   CBG (last 3)   Recent Labs  03/22/13 1624 03/22/13 2106 03/23/13 0605  GLUCAP 108* 115* 105*    IV Fluid Intake:     MEDICATIONS  . amLODipine  10 mg Oral Daily  . clopidogrel  75 mg Oral Daily  . enoxaparin (LOVENOX) injection  40 mg Subcutaneous Q24H  . feeding supplement (GLUCERNA SHAKE)  237 mL Oral BID BM  . sodium  chloride  3 mL Intravenous Q12H  . tamsulosin  0.4 mg Oral Daily   PRN:  acetaminophen, acetaminophen, albuterol, hydrALAZINE, ondansetron (ZOFRAN) IV, ondansetron  Diet:  Dysphagia 1 liquids Activity:   Up with assistance DVT Prophylaxis:  lovenox  CLINICALLY SIGNIFICANT STUDIES Basic Metabolic Panel:  Recent Labs Lab 03/21/13 0239 03/21/13 0430 03/22/13 0515 03/23/13 0602  NA 147*  --  149* 147*  K 2.7*  --  4.1 3.7  CL 110  --  118* 115*  CO2 21  --  23 25  GLUCOSE 228*  --  114* 107*  BUN 54*  --  34* 24*  CREATININE 1.43*  --  0.89 0.78  CALCIUM 9.0  --  9.0 8.7  MG  --  2.5  --   --    Liver Function Tests:  Recent Labs Lab 03/20/13 1400 03/21/13 0239  AST 76* 52*  ALT 43* 33  ALKPHOS 58 44  BILITOT 0.9 0.8  PROT 8.7* 7.1  ALBUMIN 3.4* 2.7*   CBC:  Recent Labs Lab 03/20/13 1359  03/22/13 0515 03/23/13 0602  WBC 18.7*  < > 13.1* 12.5*  NEUTROABS 15.5*  --   --   --   HGB 15.7*  < > 12.0 10.9*  HCT 43.7  < >  35.0* 32.7*  MCV 79.9  < > 81.6 83.0  PLT PLATELET CLUMPS NOTED ON SMEAR, COUNT APPEARS ADEQUATE  < > 501* 433*  < > = values in this interval not displayed. Coagulation: No results found for this basename: LABPROT, INR,  in the last 168 hours Cardiac Enzymes:  Recent Labs Lab 03/20/13 1359 03/20/13 2001 03/21/13 0239  CKTOTAL 2267*  --  1252*  TROPONINI <0.30 <0.30 <0.30   Urinalysis:  Recent Labs Lab 03/20/13 1628  COLORURINE AMBER*  LABSPEC 1.023  PHURINE 6.0  GLUCOSEU NEGATIVE  HGBUR TRACE*  BILIRUBINUR MODERATE*  KETONESUR 15*  PROTEINUR 100*  UROBILINOGEN 1.0  NITRITE NEGATIVE  LEUKOCYTESUR NEGATIVE   Lipid Panel    Component Value Date/Time   CHOL 160 03/22/2013 1200   TRIG 100 03/22/2013 1200   HDL 61 03/22/2013 1200   CHOLHDL 2.6 03/22/2013 1200   VLDL 20 03/22/2013 1200   LDLCALC 79 03/22/2013 1200   HgbA1C  Lab Results  Component Value Date   HGBA1C 5.7* 03/20/2013    Urine Drug Screen:   No results found  for this basename: labopia, cocainscrnur, labbenz, amphetmu, thcu, labbarb    Alcohol Level: No results found for this basename: ETH,  in the last 168 hours  Mr [facility] Contrast 03/22/2013   1. Small acute right inferior cerebellar infarct. No mass effect or hemorrhage.  2. Otherwise mild for age nonspecific cerebral white matter signal changes.  3. MRA reveals moderate to severe intracranial medium vessel atherosclerosis, with moderate to severe bilateral P2, bilateral M2, and left A2 stenoses. There is preserved distal flow in these branches.  4. There is also a moderate distal basilar artery stenosis, just proximal to the SCA and PCA origins, and the posterior communicating arteries are diminutive or absent.    Mr Brain Wo Contrast 03/22/2013  Cerebral volume is within normal limits for age. Major intracranial vascular flow voids are preserved. 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 Otherwise no midline shift, mass effect, or evidence of intracranial mass lesion.  There is a patchy sub cm area of restricted diffusion in the right inferior cerebellum (series 4, image 7). There is minimal associated T2 hyperintensity (series 5, image 6). No mass effect or hemorrhage.  No other restricted diffusion in the brain. No ventriculomegaly, extra-axial collection or acute intracranial hemorrhage. Cervicomedullary junction and pituitary are within normal limits. Negative visualized cervical spine.  Mild for age periventricular white matter T2 and FLAIR hyperintensity in a nonspecific configuration. No supratentorial cortical encephalomalacia. Mild T2 heterogeneity in the deep gray matter nuclei, might all relate to perivascular spaces.  Visualized orbit soft tissues are within normal limits. Visualized paranasal sinuses and mastoids are clear. Normal bone marrow signal. Visualized scalp soft tissues are within normal limits.  CT of the brain    2D  Echocardiogram  EF 65%, wall motion normal, LA size normal  Carotid Doppler  Bilateral - 1% to 39% ICA stenosis. Vertebral artery flow is antegrade.  CXR    EKG  normal sinus rhythm.   Therapy Recommendations SNF  Physical Exam   Alert, oriented, thought content appropriate. Speech fluent without evidence of aphasia. Able to follow 3 step commands without difficulty.  Cranial Nerves:  II: Discs flat bilaterally; Visual fields grossly normal, pupils equal, round, reactive to light and accommodation  III,IV, VI: ptosis not present, extra-ocular motions intact bilaterally  V,VII: smile symmetric, facial light touch sensation normal bilaterally  VIII: hearing normal bilaterally  IX,X: gag reflex present  XI: bilateral shoulder shrug  XII: midline tongue extension without atrophy or fasciculations  Motor:  Right : Upper extremity 5/5 (4/5 tricep) Left: Upper extremity 5/5  Lower extremity 5/5 Lower extremity 5/5  Tone and bulk:normal tone throughout; no atrophy noted  Sensory: Pinprick and light touch intact throughout, bilaterally  Deep Tendon Reflexes:  Right: Upper Extremity Left: Upper extremity  biceps (C-5 to C-6) 2/4 biceps (C-5 to C-6) 2/4  tricep (C7) 2/4 triceps (C7) 2/4  Brachioradialis (C6) 2/4 Brachioradialis (C6) 2/4  Lower Extremity Lower Extremity  quadriceps (L-2 to L-4) 2/4 quadriceps (L-2 to L-4) 2/4  Achilles (S1) 0/4 Achilles (S1) 0/4  Plantars:  Mute bilateral  Cerebellar:  normal finger-to-nose, normal heel-to-shin test  Gait: not assessed due to weakness.  CV: pulses palpable throughout   ASSESSMENT Ms. Cristina Patton is a 79 y.o. female presenting with acute delirium, found down after several days in feces/urine. Imaging confirms a small acute right inferior cerebellar infarct which is felt to be thromobotic secondary to small vessel disease in nature and an incidental finding.  On aspirin 81 mg orally every day prior to admission. Now on clopidogrel  75 mg orally every day for secondary stroke prevention. Patient with resultant transient delirium. Work up complete.   Dehydration  Rhabdomyolysis  Hypertension  LDL 79, at goal, no statin indicated at this time.  Hospital day # 3  TREATMENT/PLAN  Continue clopidogrel 75 mg orally every day for secondary stroke prevention.  Continue IVFs to maintain hydration.  Obtain therapy evaluations and recommendations  Have patient follow up with Dr. Pearlean Brownie in 2 months in stroke clinic.  Gwendolyn Lima. Manson Passey, PAC, MBA, MHA [facility] Pager: [phone] 03/23/2013 8:30 AM  I have personally obtained a history, examined the patient, evaluated imaging results, and formulated the assessment and plan of care. I agree with the above. Delia Heady, MD

## 2013-03-23 NOTE — Progress Notes (Addendum)
CSW received phone call that patient's family wants to speak to Child psychotherapist. CSW went into room and spoke with patient's family. Patient and family decided on SNF and want a bed at [facility]. CSW confirmed bed at [facility]. Patient and family in agreement to this plan. CSW arranging transportation by EMS. CSW signing off at this time.   CSW went to speak to patient about SNF before discharge.  Patient is refusing snf at this time, stating she wants to go home. CSW encouraged patient to rethink about it since patient does not have supervision at home. Patient states she will think about it. CSW notified MD and called patient's family to inform them. Patient's family states that they will try and talk with her again about snf. CSW is awaiting dc plans.  Maree Krabbe, MSW, Theresia Majors [phone]

## 2013-03-23 NOTE — Progress Notes (Signed)
OT NOTE  OT to defer to SNF. Pt d/cing to [facility] today. Ot to sign off   Mateo Flow   OTR/L Pager: [phone] Office: [phone] .

## 2013-03-23 NOTE — Progress Notes (Addendum)
EMS was here to pick up patient; patient refused; patient agreed to transportation at 1330; patient has become confused periodically during the day; patient is unaware as to why she is here; RN called family; family is on there way to try and talk to patient into going to the facility.  Family came and talked with patient; patient transported with [facility] to [facility], [place]

## 2013-03-23 NOTE — Progress Notes (Signed)
Clinical Social Work Department CLINICAL SOCIAL WORK PLACEMENT NOTE 03/23/2013  Patient:  Cristina Patton, Cristina Patton  Account Number:  [id number] Admit date:  03/20/2013  Clinical Social Worker:  Carren Rang  Date/time:  03/21/2013 01:40 PM  Clinical Social Work is seeking post-discharge placement for this patient at the following level of care:   SKILLED NURSING   (*CSW will update this form in Epic as items are completed)   03/21/2013  Patient/family provided with [facility] Department of Clinical Social Work's list of facilities offering this level of care within the geographic area requested by the patient (or if unable, by the patient's family).  03/21/2013  Patient/family informed of their freedom to choose among providers that offer the needed level of care, that participate in Medicare, Medicaid or managed care program needed by the patient, have an available bed and are willing to accept the patient.  03/21/2013  Patient/family informed of [facility]' ownership interest in [facility], as well as of the fact that they are under no obligation to receive care at this facility.  PASARR submitted to EDS on 03/21/2013 PASARR number received from EDS on 03/21/2013  FL2 transmitted to all facilities in geographic area requested by pt/family on  03/21/2013 FL2 transmitted to all facilities within larger geographic area on   Patient informed that his/her managed care company has contracts with or will negotiate with  certain facilities, including the following:     Patient/family informed of bed offers received:  03/22/2013 Patient chooses bed at [facility] Physician recommends and patient chooses bed at    Patient to be transferred to [facility] on  03/23/2013 Patient to be transferred to facility by EMS  The following physician request were entered in Epic:   Additional Comments:  Maree Krabbe, MSW, [employer] [phone]

## 2013-03-26 NOTE — ED Provider Notes (Signed)
Medical screening examination/treatment/procedure(s) were performed by non-physician practitioner and as supervising physician I was immediately available for consultation/collaboration.  EKG Interpretation    Date/Time:  Tuesday March 20 2013 13:25:24 EST Ventricular Rate:  107 PR Interval:  97 QRS Duration: 142 QT Interval:  344 QTC Calculation: 459 R Axis:   -5 Text Interpretation:  Sinus or ectopic atrial tachycardia Nonspecific intraventricular conduction delay Abnormal lateral Q waves Probable anterior infarct, age indeterminate Repol abnrm, severe global ischemia (LM/MVD)  baseline artifact Reconfirmed by Rubin Payor  MD, Johnika Escareno [phone]) on 03/26/2013 3:48:14 PM             Juliet Rude. Rubin Payor, MD 03/26/13 [phone]

## 2013-03-27 ENCOUNTER — Non-Acute Institutional Stay (SKILLED_NURSING_FACILITY): Payer: Medicare Other | Admitting: Internal Medicine

## 2013-03-27 DIAGNOSIS — E43 Unspecified severe protein-calorie malnutrition: Secondary | ICD-10-CM

## 2013-03-27 DIAGNOSIS — R339 Retention of urine, unspecified: Secondary | ICD-10-CM

## 2013-03-27 DIAGNOSIS — I6350 Cerebral infarction due to unspecified occlusion or stenosis of unspecified cerebral artery: Secondary | ICD-10-CM

## 2013-03-27 DIAGNOSIS — I10 Essential (primary) hypertension: Secondary | ICD-10-CM

## 2013-03-27 DIAGNOSIS — I639 Cerebral infarction, unspecified: Secondary | ICD-10-CM

## 2013-03-30 ENCOUNTER — Non-Acute Institutional Stay (SKILLED_NURSING_FACILITY): Payer: Medicare Other | Admitting: Internal Medicine

## 2013-03-30 DIAGNOSIS — K625 Hemorrhage of anus and rectum: Secondary | ICD-10-CM

## 2013-03-30 DIAGNOSIS — N10 Acute tubulo-interstitial nephritis: Secondary | ICD-10-CM

## 2013-03-30 DIAGNOSIS — D72829 Elevated white blood cell count, unspecified: Secondary | ICD-10-CM

## 2013-03-30 NOTE — Progress Notes (Signed)
Patient ID: Cristina Patton, female   DOB: Apr 08, 1927, 79 y.o.   MRN: [id number] Facility; [facility] Chief complaint; abdominal pain rectal bleeding History; I was asked to see this patient acutely for the above problems. She spent 3 days in [facility] presenting with dehydration and acute delirium. She had an extensive neurologic workup including an MRI of the brain that showed a small acute right inferior cerebellar infarct and significant severe intracranial medium vessel atherosclerosis. She was also found to be mildly hypernatremic with a sodium of 147 on November 21 a BUN of 24 and creatinine of 0.78. A white count was noted to be elevated at 12.5 a cause of this was not exactly determined.  She was noted today to have a rectal bleeding which was listed as moderate amount she is also complaining of abdominal pain. Lab work from yesterday showed a white count of 28.8 With 81% neutrophils. Hemoglobin 11.3 basic metabolic panel showed a BUN of 25 a creatinine of 0.94.  Review of systems Respiratory the patient is not complaining of shortness of breath Cardiac no complaints of chest pain Abdomen vaguely rubs or left lower quadrant when asked about abdominal pain. There is no nausea vomiting or she is not eating well. GU she has a Foley catheter in place due to urinary retention in the hospital. Apparently this could not be removed  Physical exam Gen. the patient does not appear to be in any distress Vitals blood pressure 140/88 temperature 98.6 pulse 90 respirations 18 O2 sat is 96% on room air Respiratory clear entry bilaterally Cardiac heart sounds somewhat tacky. There appears to be a split first sound and perhaps a midsystolic click however she appears to be euvolemic Abdomen; the patient has 2 large inguinal hernias right greater than left. Both of these reduced fairly easily however both of these could be causes of abdominal pain. She still does have left lower quadrant pain GU  Foley catheter in place question left-sided CVA tenderness Rectal very significant distal impaction stool test guaiac positive but there is no gross bleeding  Impression/plan #1 abdominal pain; patient has 2 large inguinal hernias which reduced with mild difficulty. This could be the cause of her discomfort. Nevertheless a white count of 28.8 is the worrisome and certainly much higher than the 12.5-13 she ran throughout the hospitalization. I looked on epic there are no relevant cultures. I am going to clear her impaction, recheck her lab work and start her on empiric antibiotics while we wait for urine culture. This could be diverticulitis, pyelonephritis #2 distal fecal impaction which we will aggressively clear #3 rectal bleeding in the setting of a very significant impaction.  #4 large bilateral inguinal hernias the one on the right did not reduce his easily as the one on the left. This it certainly could be accounting for some of the patient's difficulties  I do not believe the patient needs hospitalization at the moment however the white count is of concern

## 2013-04-02 ENCOUNTER — Inpatient Hospital Stay (HOSPITAL_COMMUNITY): Payer: Medicare Other

## 2013-04-02 ENCOUNTER — Inpatient Hospital Stay (HOSPITAL_COMMUNITY)
Admission: EM | Admit: 2013-04-02 | Discharge: 2013-04-05 | DRG: 377 | Disposition: A | Discharge status: Skilled Nursing Facility | Payer: Medicare Other | Attending: Internal Medicine | Admitting: Internal Medicine

## 2013-04-02 ENCOUNTER — Encounter (HOSPITAL_COMMUNITY): Payer: Self-pay | Admitting: Emergency Medicine

## 2013-04-02 DIAGNOSIS — R339 Retention of urine, unspecified: Secondary | ICD-10-CM | POA: Diagnosis present

## 2013-04-02 DIAGNOSIS — E43 Unspecified severe protein-calorie malnutrition: Secondary | ICD-10-CM

## 2013-04-02 DIAGNOSIS — D62 Acute posthemorrhagic anemia: Secondary | ICD-10-CM | POA: Diagnosis present

## 2013-04-02 DIAGNOSIS — L98499 Non-pressure chronic ulcer of skin of other sites with unspecified severity: Secondary | ICD-10-CM | POA: Diagnosis present

## 2013-04-02 DIAGNOSIS — K5731 Diverticulosis of large intestine without perforation or abscess with bleeding: Principal | ICD-10-CM | POA: Diagnosis present

## 2013-04-02 DIAGNOSIS — K5900 Constipation, unspecified: Secondary | ICD-10-CM | POA: Diagnosis present

## 2013-04-02 DIAGNOSIS — N390 Urinary tract infection, site not specified: Secondary | ICD-10-CM

## 2013-04-02 DIAGNOSIS — L89309 Pressure ulcer of unspecified buttock, unspecified stage: Secondary | ICD-10-CM | POA: Diagnosis present

## 2013-04-02 DIAGNOSIS — L8992 Pressure ulcer of unspecified site, stage 2: Secondary | ICD-10-CM | POA: Diagnosis present

## 2013-04-02 DIAGNOSIS — E870 Hyperosmolality and hypernatremia: Secondary | ICD-10-CM

## 2013-04-02 DIAGNOSIS — G9340 Encephalopathy, unspecified: Secondary | ICD-10-CM

## 2013-04-02 DIAGNOSIS — Z8673 Personal history of transient ischemic attack (TIA), and cerebral infarction without residual deficits: Secondary | ICD-10-CM

## 2013-04-02 DIAGNOSIS — I10 Essential (primary) hypertension: Secondary | ICD-10-CM | POA: Diagnosis present

## 2013-04-02 DIAGNOSIS — K922 Gastrointestinal hemorrhage, unspecified: Secondary | ICD-10-CM | POA: Diagnosis present

## 2013-04-02 DIAGNOSIS — E860 Dehydration: Secondary | ICD-10-CM

## 2013-04-02 DIAGNOSIS — N179 Acute kidney failure, unspecified: Secondary | ICD-10-CM

## 2013-04-02 DIAGNOSIS — D72829 Elevated white blood cell count, unspecified: Secondary | ICD-10-CM

## 2013-04-02 DIAGNOSIS — Z66 Do not resuscitate: Secondary | ICD-10-CM | POA: Diagnosis present

## 2013-04-02 DIAGNOSIS — I639 Cerebral infarction, unspecified: Secondary | ICD-10-CM | POA: Diagnosis present

## 2013-04-02 HISTORY — DX: Cerebral infarction, unspecified: I63.9

## 2013-04-02 LAB — URINALYSIS, ROUTINE W REFLEX MICROSCOPIC
Glucose, UA: NEGATIVE mg/dL
Ketones, ur: 15 mg/dL — AB
Nitrite: NEGATIVE
Specific Gravity, Urine: 1.024 (ref 1.005–1.030)
pH: 5 (ref 5.0–8.0)

## 2013-04-02 LAB — CBC WITH DIFFERENTIAL/PLATELET
Basophils Relative: 0 % (ref 0–1)
Eosinophils Relative: 0 % (ref 0–5)
HCT: 18.3 % — ABNORMAL LOW (ref 36.0–46.0)
Hemoglobin: 6.2 g/dL — CL (ref 12.0–15.0)
Lymphs Abs: 2.5 10*3/uL (ref 0.7–4.0)
MCH: 27.2 pg (ref 26.0–34.0)
MCHC: 33.9 g/dL (ref 30.0–36.0)
MCV: 80.3 fL (ref 78.0–100.0)
Monocytes Absolute: 1.5 10*3/uL — ABNORMAL HIGH (ref 0.1–1.0)
Platelets: 767 10*3/uL — ABNORMAL HIGH (ref 150–400)
RBC: 2.28 MIL/uL — ABNORMAL LOW (ref 3.87–5.11)

## 2013-04-02 LAB — BASIC METABOLIC PANEL
BUN: 20 mg/dL (ref 6–23)
CO2: 21 mEq/L (ref 19–32)
Calcium: 7.6 mg/dL — ABNORMAL LOW (ref 8.4–10.5)
Creatinine, Ser: 0.76 mg/dL (ref 0.50–1.10)
GFR calc Af Amer: 87 mL/min — ABNORMAL LOW (ref >90)
Glucose, Bld: 166 mg/dL — ABNORMAL HIGH (ref 70–99)
Sodium: 135 mEq/L (ref 135–145)

## 2013-04-02 LAB — PREPARE RBC (CROSSMATCH)

## 2013-04-02 LAB — URINE MICROSCOPIC-ADD ON

## 2013-04-02 LAB — MRSA PCR SCREENING: MRSA by PCR: NEGATIVE

## 2013-04-02 MED ORDER — ACETAMINOPHEN 325 MG PO TABS: 650.0000 mg | ORAL_TABLET | Freq: Four times a day (QID) | ORAL | Status: DC | PRN

## 2013-04-02 MED ORDER — ACETAMINOPHEN 650 MG RE SUPP: 650.0000 mg | Freq: Four times a day (QID) | RECTAL | Status: DC | PRN

## 2013-04-02 MED ORDER — SODIUM CHLORIDE 0.9 % IJ SOLN
3.0000 mL | Freq: Two times a day (BID) | INTRAMUSCULAR | Status: DC
  Administered 2013-04-02 – 2013-04-05 (×5): 3 mL via INTRAVENOUS

## 2013-04-02 MED ORDER — SODIUM CHLORIDE 0.9 % IV SOLN: INTRAVENOUS | Status: DC

## 2013-04-02 MED ORDER — ONDANSETRON HCL 4 MG/2ML IJ SOLN: 4.0000 mg | Freq: Four times a day (QID) | INTRAMUSCULAR | Status: DC | PRN

## 2013-04-02 MED ORDER — ONDANSETRON HCL 4 MG PO TABS: 4.0000 mg | ORAL_TABLET | Freq: Four times a day (QID) | ORAL | Status: DC | PRN

## 2013-04-02 NOTE — ED Notes (Signed)
Blood consent signed by Roberts Gaudy.

## 2013-04-02 NOTE — ED Notes (Signed)
PT lives at [facility]. This AM the Nurse Tech reported rectal bleeding was observed while giving AM care. Pt was transported via [facility] to ED. Vitals WNL and Pt alert.

## 2013-04-02 NOTE — ED Provider Notes (Addendum)
CSN: [id number]     Arrival date & time 04/02/13  1001 History   First MD Initiated Contact with Patient 04/02/13 1003     Chief Complaint  Patient presents with  . Rectal Bleeding    Patient is a 79 y.o. female presenting with hematochezia. The history is provided by the EMS personnel. The history is limited by the condition of the patient.  Rectal Bleeding Quality:  Bright red Amount:  Moderate Duration: today. Timing:  Constant Progression:  Worsening Chronicity:  New Relieved by:  Nothing pt presents from nursing facility for rectal bleeding It was noted this morning No other details are known at this time  Past Medical History  Diagnosis Date  . Hypertension    History reviewed. No pertinent past surgical history. No family history on file. History  Substance Use Topics  . Smoking status: Never Smoker   . Smokeless tobacco: Not on file  . Alcohol Use: No   OB History   Grav Para Term Preterm Abortions TAB SAB Ect Mult Living                 Review of Systems  Unable to perform ROS: Acuity of condition  Gastrointestinal: Positive for hematochezia.    Allergies  Review of patient's allergies indicates no known allergies.  Home Medications   Current Outpatient Rx  Name  Route  Sig  Dispense  Refill  . amLODipine (NORVASC) 10 MG tablet   Oral   Take 1 tablet (10 mg total) by mouth daily.         . clopidogrel (PLAVIX) 75 MG tablet   Oral   Take 1 tablet (75 mg total) by mouth daily.         Marland Kitchen lactose free nutrition (BOOST PLUS) LIQD   Oral   Take 237 mLs by mouth 2 (two) times daily between meals.         . tamsulosin (FLOMAX) 0.4 MG CAPS capsule   Oral   Take 1 capsule (0.4 mg total) by mouth daily.   30 capsule      BP 136/53  Pulse 108  Resp 19  SpO2 99%  SpO2 100% Physical Exam CONSTITUTIONAL: elderly, ill appearing HEAD: Normocephalic/atraumatic ENMT: Mucous membranes moist NECK: supple no meningeal signs SPINE:entire spine  nontender CV: S1/S2 noted LUNGS: Lungs are clear to auscultation bilaterally, no apparent distress ABDOMEN: soft, nontender, no rebound or guarding Rectal - bright red blood per rectum, chaperone present MW:NUUVO catheter noted on arrival NEURO: Pt is somnolent but will arouse, moves all extremitiesx4, she will intermittently close her eyes and not interact EXTREMITIES: pulses normal, full ROM SKIN: warm, color normal   ED Course  Procedures (including critical care time) CRITICAL CARE Performed by: Joya Gaskins Total critical care time: 35 Critical care time was exclusive of separately billable procedures and treating other patients. Critical care was necessary to treat or prevent imminent or life-threatening deterioration. Critical care was time spent personally by me on the following activities: development of treatment plan with patient and/or surrogate as well as nursing, discussions with consultants, evaluation of patient's response to treatment, examination of patient, obtaining history from patient or surrogate, ordering and performing treatments and interventions, ordering and review of laboratory studies, ordering and review of radiographic studies, pulse oximetry and re-evaluation of patient's condition.  Labs Review Labs Reviewed  BASIC METABOLIC PANEL  CBC WITH DIFFERENTIAL  TYPE AND SCREEN   Imaging Review No results found.  EKG Interpretation  Date/Time:  Monday April 02 2013 10:14:56 EST Ventricular Rate:  101 PR Interval:  106 QRS Duration: 75 QT Interval:  341 QTC Calculation: 442 R Axis:   45 Text Interpretation:  Sinus tachycardia Otherwise within normal limits No significant change since last tracing Confirmed by Bebe Shaggy  MD, Lindsy Cerullo [id number]) on 04/02/2013 10:20:11 AM           11:13 AM Pt with noted GI bleed Pt currently stable Will need admission 11:34 AM Pt altered (likely from anemia) Attempting to get a hold of niece at [phone],  message left After d/w nurse at facility, she is not known to be jehovahs witness 12:08 PM Spoke to niece and nephew and they confirm she is not a Fish farm manager witness and has no objections to blood products I discussed risk/benefit for blood transfusion to nephew and he agrees and signs form 12:14 PM D/w dr Loreta Ave, will see patient Will admit to triad  12:32 PM D/w dr Arthor Captain, will admit to stepdown MDM  No diagnosis found. Nursing notes including past medical history and social history reviewed and considered in documentation Labs/vital reviewed and considered Previous records reviewed and considered     Joya Gaskins, MD 04/02/13 1232  Joya Gaskins, MD 04/02/13 1233

## 2013-04-02 NOTE — Progress Notes (Signed)
Blood stopped due to 4 hour time administration limit.  When patient arrived blood was running at rate of 76ml/hr.  Will notify MD as only aproximately 50 cc of blood was administered.

## 2013-04-02 NOTE — Consult Note (Signed)
Unassigned patient. Reason for Consult: Rectal bleeding with severe anemia. Referring Physician: ER MD  Cristina Patton is an 79 y.o. female.  HPI: 79 year old female, with multiple medical problems listed below, presented to the ER with a large amount of blood in her diaper and was found to have a hemoglobin of 6.2 gm/dl. Her history is complicated by a cerebellar infarction that she has had recently. She is DNR as well.  The patient complains of mild abdominal cramping but is unable to give me any history.    Past Medical History  Diagnosis Date  . Hypertension   . CVA (cerebral infarction)    History reviewed. No pertinent past surgical history.  No family history on file.  Social History:  reports that she has never smoked. She does not have any smokeless tobacco history on file. She reports that she does not drink alcohol or use illicit drugs.  Allergies: No Known Allergies  Medications: I have reviewed the patient's current medications.  Results for orders placed during the hospital encounter of 04/02/13 (from the past 48 hour(s))  BASIC METABOLIC PANEL     Status: Abnormal   Collection Time    04/02/13 10:31 AM      Result Value Range   Sodium 135  135 - 145 mEq/L   Potassium 4.2  3.5 - 5.1 mEq/L   Chloride 101  96 - 112 mEq/L   CO2 21  19 - 32 mEq/L   Glucose, Bld 166 (*) 70 - 99 mg/dL   BUN 20  6 - 23 mg/dL   Creatinine, Ser 1.61  0.50 - 1.10 mg/dL   Calcium 7.6 (*) 8.4 - 10.5 mg/dL   GFR calc non Af Amer 75 (*) >90 mL/min   GFR calc Af Amer 87 (*) >90 mL/min   Comment: (NOTE)     The eGFR has been calculated using the CKD EPI equation.     This calculation has not been validated in all clinical situations.     eGFR's persistently <90 mL/min signify possible Chronic Kidney     Disease.  CBC WITH DIFFERENTIAL     Status: Abnormal   Collection Time    04/02/13 10:31 AM      Result Value Range   WBC 24.8 (*) 4.0 - 10.5 K/uL   RBC 2.28 (*) 3.87 - 5.11 MIL/uL    Hemoglobin 6.2 (*) 12.0 - 15.0 g/dL   Comment: REPEATED TO VERIFY     CRITICAL RESULT CALLED TO, READ BACK BY AND VERIFIED WITH:     S. MCKEOWM (RN) 1117 04/02/2013 L. LOMAX   HCT 18.3 (*) 36.0 - 46.0 %   MCV 80.3  78.0 - 100.0 fL   MCH 27.2  26.0 - 34.0 pg   MCHC 33.9  30.0 - 36.0 g/dL   RDW 09.6 (*) 04.5 - 40.9 %   Platelets 767 (*) 150 - 400 K/uL   Neutrophils Relative % 84 (*) 43 - 77 %   Lymphocytes Relative 10 (*) 12 - 46 %   Monocytes Relative 6  3 - 12 %   Eosinophils Relative 0  0 - 5 %   Basophils Relative 0  0 - 1 %   Neutro Abs 20.8 (*) 1.7 - 7.7 K/uL   Lymphs Abs 2.5  0.7 - 4.0 K/uL   Monocytes Absolute 1.5 (*) 0.1 - 1.0 K/uL   Eosinophils Absolute 0.0  0.0 - 0.7 K/uL   Basophils Absolute 0.0  0.0 -  0.1 K/uL   WBC Morphology       Value: MODERATE LEFT SHIFT (>5% METAS AND MYELOS,OCC PRO NOTED)  TYPE AND SCREEN     Status: None   Collection Time    04/02/13 10:48 AM      Result Value Range   ABO/RH(D) A POS     Antibody Screen NEG     Sample Expiration 04/05/2013     Unit Number [id number]     Blood Component Type RED CELLS,LR     Unit division 00     Status of Unit ISSUED     Transfusion Status OK TO TRANSFUSE     Crossmatch Result Compatible     Unit Number [id number]     Blood Component Type RED CELLS,LR     Unit division 00     Status of Unit ISSUED     Transfusion Status OK TO TRANSFUSE     Crossmatch Result Compatible     Unit Number [id number]     Blood Component Type RED CELLS,LR     Unit division 00     Status of Unit ALLOCATED     Transfusion Status OK TO TRANSFUSE     Crossmatch Result Compatible    ABO/RH     Status: None   Collection Time    04/02/13 10:48 AM      Result Value Range   ABO/RH(D) A POS    PREPARE RBC (CROSSMATCH)     Status: None   Collection Time    04/02/13 12:00 PM      Result Value Range   Order Confirmation ORDER PROCESSED BY BLOOD BANK    TROPONIN I     Status: None   Collection Time    04/02/13  2:40 PM       Result Value Range   Troponin I <0.30  <0.30 ng/mL   Comment:            Due to the release kinetics of cTnI,     a negative result within the first hours     of the onset of symptoms does not rule out     myocardial infarction with certainty.     If myocardial infarction is still suspected,     repeat the test at appropriate intervals.  TROPONIN I     Status: None   Collection Time    04/02/13  6:15 PM      Result Value Range   Troponin I <0.30  <0.30 ng/mL   Comment:            Due to the release kinetics of cTnI,     a negative result within the first hours     of the onset of symptoms does not rule out     myocardial infarction with certainty.     If myocardial infarction is still suspected,     repeat the test at appropriate intervals.  URINALYSIS, ROUTINE W REFLEX MICROSCOPIC     Status: Abnormal   Collection Time    04/02/13  6:40 PM      Result Value Range   Color, Urine YELLOW  YELLOW   APPearance CLOUDY (*) CLEAR   Specific Gravity, Urine 1.024  1.005 - 1.030   pH 5.0  5.0 - 8.0   Glucose, UA NEGATIVE  NEGATIVE mg/dL   Hgb urine dipstick SMALL (*) NEGATIVE   Bilirubin Urine SMALL (*) NEGATIVE   Ketones, ur 15 (*) NEGATIVE mg/dL   Protein, ur  100 (*) NEGATIVE mg/dL   Urobilinogen, UA 1.0  0.0 - 1.0 mg/dL   Nitrite NEGATIVE  NEGATIVE   Leukocytes, UA MODERATE (*) NEGATIVE  URINE MICROSCOPIC-ADD ON     Status: Abnormal   Collection Time    04/02/13  6:40 PM      Result Value Range   Squamous Epithelial / LPF RARE  RARE   WBC, UA 21-50  <3 WBC/hpf   RBC / HPF 3-6  <3 RBC/hpf   Bacteria, UA MANY (*) RARE   Casts HYALINE CASTS (*) NEGATIVE   Urine-Other LESS THAN 10 mL OF URINE SUBMITTED    PREPARE RBC (CROSSMATCH)     Status: None   Collection Time    04/02/13  7:30 PM      Result Value Range   Order Confirmation ORDER PROCESSED BY BLOOD BANK     Dg Chest Port 1 View  04/02/2013   CLINICAL DATA:  Hypertension, elevated white blood cell count  EXAM:  PORTABLE CHEST - 1 VIEW  COMPARISON:  March 20 2013  FINDINGS: The heart size and mediastinal contours are within normal limits. There is no focal infiltrate, pulmonary edema, or pleural effusion. The lung volumes are low. The visualized skeletal structures are stable.  IMPRESSION: No active cardiopulmonary disease.   Electronically Signed   By: Sherian Rein M.D.   On: 04/02/2013 14:05   Dg Abd 2 Views  04/02/2013   CLINICAL DATA:  Rectal bleed, elevated white blood cell count.  EXAM: ABDOMEN - 2 VIEW  COMPARISON:  None.  FINDINGS: The bowel gas pattern is normal. There is no evidence of free air. Extensive bowel content is identified in the colon. A radiopaque needle is projected in the lower pelvis.  IMPRESSION: A radiopaque opaque needle is projected in the lower pelvis. Constipation.   Electronically Signed   By: Sherian Rein M.D.   On: 04/02/2013 13:21   Review of Systems  Constitutional: Negative.   HENT: Negative.   Eyes: Negative.   Cardiovascular: Negative.   Gastrointestinal: Positive for abdominal pain, constipation and blood in stool. Negative for heartburn, nausea, vomiting, diarrhea and melena.  Musculoskeletal: Positive for joint pain.  Skin: Negative.   Endo/Heme/Allergies: Negative.   Psychiatric/Behavioral: Positive for memory loss.   Blood pressure 143/55, pulse 96, temperature 97.5 F (36.4 C), temperature source Oral, resp. rate 19, height 5\' 8"  (1.727 m), weight 55.6 kg (122 lb 9.2 oz), SpO2 100.00%. Physical Exam  Constitutional: She is oriented to person, place, and time. She appears well-developed and well-nourished.  HENT:  Head: Normocephalic and atraumatic.  Eyes: Pupils are equal, round, and reactive to light.  Cardiovascular: Normal rate and regular rhythm.   Respiratory: Effort normal and breath sounds normal.  GI: Soft. Bowel sounds are normal. She exhibits no distension and no mass. There is no tenderness. There is no rebound and no guarding.   Musculoskeletal: Normal range of motion.  Neurological: She is alert and oriented to person, place, and time.  Skin: Skin is warm and dry.   Assessment/Plan: 1) Rectal bleeding with anemia ?diverticular bleed vs colonic neoplasm: as per my discussion with the patient's nephew, who is helping with all her decision making [as she has no children] plans are to continue supportive care. I do not feel she is a candidate for endoscopic procedures and her nephew, Mr. Chestine Spore is in agreement with my decision. Will sign off now. Please cal if we can be of further assistance in the future.  Jaquanna Ballentine 04/02/2013, 8:40 PM

## 2013-04-02 NOTE — H&P (Signed)
[facility] History and Physical  Cristina Patton NWG:[id number] DOB: 24-Jul-1926 DOA: 04/02/2013  Referring physician: Zadie Rhine, MD PCP: Karlene Einstein, MD  Specialists: none  Chief Complaint: bleeding from rectum  HPI: Cristina Patton is a 80 y.o. female with a PMHx of hypertension and ischemic stroke that presented to the ER with bright red blood coming from her rectum.  According to the EMS personnel and the nursing home personnel where she resides, the bleeding started several days ago (see Dr. Leanord Hawking 11/28 nursing home visit note) and has been constant, but worsening.  Nothing has relieved the bleeding.  It may have been worsened by a disimpaction that was done in the nursing facility.  In the ED hemoglobin is 6.2, WBC 24.8, platelets 767, abdominal x-ray showing constipation. Overall patient is lethargic but easy to arouse with short attention span.  Review of Systems:  Unable to obtain due to patient's mental state.  Past Medical History  Diagnosis Date  . Hypertension   . CVA (cerebral infarction)    History reviewed. No pertinent past surgical history. Social History:  reports that she has never smoked. She does not have any smokeless tobacco history on file. She reports that she does not drink alcohol or use illicit drugs. Pt lives at [facility].  At patient's baseline she can participate in ADLs  No Known Allergies  No family history on file.   Prior to Admission medications   Medication Sig Start Date End Date Taking? Authorizing Provider  amLODipine (NORVASC) 10 MG tablet Take 1 tablet (10 mg total) by mouth daily. 03/23/13  Yes Leroy Sea, MD  clopidogrel (PLAVIX) 75 MG tablet Take 1 tablet (75 mg total) by mouth daily. 03/23/13  Yes Leroy Sea, MD  lactose free nutrition (BOOST PLUS) LIQD Take 237 mLs by mouth 2 (two) times daily between meals.   Yes Historical Provider, MD  tamsulosin (FLOMAX) 0.4 MG CAPS capsule Take 1  capsule (0.4 mg total) by mouth daily. 03/23/13  Yes Leroy Sea, MD   Physical Exam: Filed Vitals:   04/02/13 1339  BP: 123/52  Pulse: 103  Temp: 97.9 F (36.6 C)  Resp: 16     General:  Elderly woman lying in bed, not responsive to verbal commands, extremely somnolent.  "help me, help me"  Eyes: PER  ENT: moist mucosa  Neck: no JVD, thyromegaly, or lymphadenopathy  Cardiovascular: regular rhythm, slightly tachycardic, no m/g/r  Respiratory: clear to auscultation bilaterally, no inc wob  Abdomen: +BS,  Tender to palpation in lower quadrants, nondistended, some small hard areas appreciated (probably stool)  Skin: obvious pallor throughout  Musculoskeletal: ROM intact x 4, no edema  Psychiatric: unable to assess due to pts mental status  Neurologic: upgoing Babinski on Lt and Rt, no obvious neuro deficits  Labs on Admission:  Basic Metabolic Panel:  Recent Labs Lab 04/02/13 1031  NA 135  K 4.2  CL 101  CO2 21  GLUCOSE 166*  BUN 20  CREATININE 0.76  CALCIUM 7.6*   CBC:  Recent Labs Lab 04/02/13 1031  WBC 24.8*  NEUTROABS 20.8*  HGB 6.2*  HCT 18.3*  MCV 80.3  PLT 767*   Radiological Exams on Admission: Dg Abd 2 Views  04/02/2013   CLINICAL DATA:  Rectal bleed, elevated white blood cell count.  EXAM: ABDOMEN - 2 VIEW  COMPARISON:  None.  FINDINGS: The bowel gas pattern is normal. There is no evidence of free air. Extensive bowel content is  identified in the colon. A radiopaque needle is projected in the lower pelvis.  IMPRESSION: A radiopaque opaque needle is projected in the lower pelvis. Constipation.   Electronically Signed   By: Sherian Rein M.D.   On: 04/02/2013 13:21    EKG: Independently reviewed; tachycardia  Assessment/Plan Active Problems:   Acute encephalopathy   GI bleed   Acute blood loss anemia   CVA (cerebral infarction)   GI bleed -Bright red blood per rectum, could be secondary to diverticular bleed or  hemorrhoidal. -CBC shows Hb 6.2, WBC 24.8; will transfuse 2 units. -Abdominal film shows no free air, but significant constipation; may need CT with NG administered contrast to get more information - GI has been consulted (Dr. Loreta Ave)  Acute blood loss anemia -2/2 to GI bleed. -Hemoglobin was 10.9 on 11/21, she is presented with hemoglobin of 6.2. -Transfuse 2 units of packed RBC (typed and crossmatched); will repeat CBC  Leukocytosis -Patient is lethargic, has leukocytosis but no documented fever. -Abdominal decubitus x-rays showed no evidence of free air/no perforation. -Patient was getting repeated enemas and the nursing home for obstipation. -Could be secondary to stress, blood cultures and urine culture obtained.  Acute encephalopathy -Etiology unknown? Possibly due to acute blood loss in setting of GI bleed -Pt had CVA about 10 days ago, mild rt cerebellar infarct seen on CT at the time -Has elevated WBC as of 12/1, unknown etiology -Need CBG, UA, and blood cultures  CVA -Recent cerebellar infarct on 03/22/2013. -She was recently switched from aspirin to Plavix on 11/21.   Code Status: DNR Family Communication: pt alone, nephew's phone number was given  Disposition Plan: likely admit to inpatient  Time spent: 70 minutes  Dorinda Hill, PA-S [facility] Pager [phone]  If 7PM-7AM, please contact night-coverage [email or web address] Password [facility] 04/02/2013, 1:50 PM     Addendum  Patient seen and examined, chart and data base reviewed.  I agree with the above assessment and plan.  For full details please see Mrs. Dorinda Hill, PA-S note.  I reviewed and addended the above note.   Clint Lipps, MD [facility] Pager: [phone] 04/02/2013, 2:54 PM

## 2013-04-03 DIAGNOSIS — N390 Urinary tract infection, site not specified: Secondary | ICD-10-CM | POA: Diagnosis present

## 2013-04-03 DIAGNOSIS — N179 Acute kidney failure, unspecified: Secondary | ICD-10-CM

## 2013-04-03 DIAGNOSIS — I6350 Cerebral infarction due to unspecified occlusion or stenosis of unspecified cerebral artery: Secondary | ICD-10-CM

## 2013-04-03 LAB — CBC
Hemoglobin: 9.8 g/dL — ABNORMAL LOW (ref 12.0–15.0)
Hemoglobin: 9.6 g/dL — ABNORMAL LOW (ref 12.0–15.0)
Hemoglobin: 8.8 g/dL — ABNORMAL LOW (ref 12.0–15.0)
MCH: 28.7 pg (ref 26.0–34.0)
MCHC: 34.4 g/dL (ref 30.0–36.0)
MCHC: 36.2 g/dL — ABNORMAL HIGH (ref 30.0–36.0)
Platelets: 585 10*3/uL — ABNORMAL HIGH (ref 150–400)
Platelets: 558 10*3/uL — ABNORMAL HIGH (ref 150–400)
RBC: 3.41 MIL/uL — ABNORMAL LOW (ref 3.87–5.11)
RBC: 3.02 MIL/uL — ABNORMAL LOW (ref 3.87–5.11)
RDW: 14.9 % (ref 11.5–15.5)
RDW: 15.5 % (ref 11.5–15.5)
WBC: 20.4 10*3/uL — ABNORMAL HIGH (ref 4.0–10.5)
WBC: 17.9 10*3/uL — ABNORMAL HIGH (ref 4.0–10.5)

## 2013-04-03 LAB — TYPE AND SCREEN: Unit division: 0

## 2013-04-03 LAB — BASIC METABOLIC PANEL
BUN: 27 mg/dL — ABNORMAL HIGH (ref 6–23)
Chloride: 102 mEq/L (ref 96–112)
Creatinine, Ser: 0.7 mg/dL (ref 0.50–1.10)
GFR calc Af Amer: 90 mL/min — ABNORMAL LOW (ref >90)
GFR calc non Af Amer: 77 mL/min — ABNORMAL LOW (ref >90)
Glucose, Bld: 138 mg/dL — ABNORMAL HIGH (ref 70–99)
Potassium: 4 mEq/L (ref 3.5–5.1)

## 2013-04-03 LAB — TROPONIN I: Troponin I: <0.3 ng/mL (ref <0.30)

## 2013-04-03 MED ORDER — BOOST / RESOURCE BREEZE PO LIQD
1.0000 | Freq: Three times a day (TID) | ORAL | Status: DC
  Administered 2013-04-03 – 2013-04-04 (×3): 1 via ORAL

## 2013-04-03 MED ORDER — DEXTROSE 5 % IV SOLN
1.0000 g | INTRAVENOUS | Status: DC
  Administered 2013-04-03: 1 g via INTRAVENOUS
  Filled 2013-04-03 (×3): qty 10

## 2013-04-03 MED ORDER — AMLODIPINE BESYLATE 5 MG PO TABS
5.0000 mg | ORAL_TABLET | Freq: Every day | ORAL | Status: DC
  Administered 2013-04-03 – 2013-04-05 (×3): 5 mg via ORAL
  Filled 2013-04-03 (×3): qty 1

## 2013-04-03 MED ORDER — TAMSULOSIN HCL 0.4 MG PO CAPS
0.4000 mg | ORAL_CAPSULE | Freq: Every day | ORAL | Status: DC
  Administered 2013-04-03 – 2013-04-05 (×3): 0.4 mg via ORAL
  Filled 2013-04-03 (×3): qty 1

## 2013-04-03 NOTE — Care Management Note (Addendum)
    Page 1 of 1   04/06/2013     10:41:42 AM   CARE MANAGEMENT NOTE 04/06/2013  Patient:  Cristina Patton, Cristina Patton   Account Number:  [id number]  Date Initiated:  04/03/2013  Documentation initiated by:  Junius Creamer  Subjective/Objective Assessment:   adm w gi bleed     Action/Plan:   from nsg facility, pcp dr Kerry Dory   Anticipated DC Date:  04/05/2013   Anticipated DC Plan:  SKILLED NURSING FACILITY  In-house referral  Clinical Social Worker      DC Planning Services  CM consult      Choice offered to / List presented to:             Status of service:  Completed, signed off Medicare Important Message given?   (If response is "NO", the following Medicare IM given date fields will be blank) Date Medicare IM given:   Date Additional Medicare IM given:    Discharge Disposition:  SKILLED NURSING FACILITY  Per UR Regulation:  Reviewed for med. necessity/level of care/duration of stay  If discussed at Long Length of Stay Meetings, dates discussed:    Comments:  04/06/13 10:40 Letha Cape RN, BSN [phone] patient dc to [facility] , CSW following.

## 2013-04-03 NOTE — Progress Notes (Signed)
Clinical Social Work Department BRIEF PSYCHOSOCIAL ASSESSMENT 04/03/2013  Patient:  Cristina Patton, Cristina Patton     Account Number:  [id number]     Admit date:  04/02/2013  Clinical Social Worker:  Varney Biles  Date/Time:  04/03/2013 10:44 AM  Referred by:  Physician  Date Referred:  04/03/2013 Referred for  SNF Placement   Other Referral:   Interview type:  Patient Other interview type:    PSYCHOSOCIAL DATA Living Status:  FACILITY Admitted from facility:  [facility] Level of care:  Skilled Nursing Facility Primary support name:  Janett Labella [phone]) Primary support relationship to patient:  FAMILY Degree of support available:   Good--pt's nephews, niece, and friends provide support to pt.    CURRENT CONCERNS Current Concerns  Post-Acute Placement   Other Concerns:    SOCIAL WORK ASSESSMENT / PLAN CSW notified by [facility] that pt is from [facility]. CSW introduced self to pt and verified that she is from this facility. Pt confirmed and invited CSW to call and alert facility of her hospitalization and inform them when discharge is imminent. CSW asked if there is family she should contact when pt is ready for discharge--pt states CSW can call her nephews Ree Kida [phone]) & Myrtie Neither [phone]).   Assessment/plan status:  Psychosocial Support/Ongoing Assessment of Needs Other assessment/ plan:   Information/referral to community resources:   SNF [facility] [place])    PATIENT'S/FAMILY'S RESPONSE TO PLAN OF CARE: Pt receptive to CSW conversation and understanding of CSW role. Pt engaged in conversation with CSW and was sitting up in bed when CSW completed assessment.        Maryclare Labrador, MSW, [facility] Clinical Social Worker [phone]

## 2013-04-03 NOTE — Progress Notes (Addendum)
INITIAL NUTRITION ASSESSMENT  DOCUMENTATION CODES Per approved criteria  -Severe malnutrition in the context of chronic illness   INTERVENTION: 1.  Supplements; Resource Breeze po TID, each supplement provides 250 kcal and 9 grams of protein. Glucerna Shake twice daily once diet resumed (220 kcals, 10 gm protein per 8 fl oz bottle)  NUTRITION DIAGNOSIS: Malnutrition related to chronic illness, weakness AEB ongoing wt loss, wasting  Monitor:  1.  Food/Beverage; pt meeting >/=90% estimated needs with tolerance. 2.  Wt/wt change; monitor trends  Reason for Assessment: Low Braden  79 y.o. female  Admitting Dx: rectal bleeding  ASSESSMENT: Pt admitted with rectal bleeding. Recently discharged after being found down in her home for 24-48 hrs.   Pt states that her appetite is poor.  States "I eat about half of what they give me."  She states this is her usual. She is not sure if she drinks supplements.   Nutrition Focused Physical Exam: Subcutaneous Fat:  Orbital Region: mild wasting Upper Arm Region: mild wasting; saggyness noted Thoracic and Lumbar Region: WNL  Muscle:  Temple Region: mild-moderate wasting Clavicle Bone Region: mild wasting Clavicle and Acromion Bone Region: WNL Scapular Bone Region: mild-moderate wasting Dorsal Hand: moderate wasting Patellar Region: mild wasting Anterior Thigh Region: severe wasting Posterior Calf Region: severe wasting  Edema: none present  Pt was diagnosed with severe malnutrition of chronic illness at last admission.  Pt continues to meet criteria with reported decreased intake and 8 lbs of wt change (6% in <2 weeks).  Height: 5' 6 (167.6 cm)  Weight: Wt Readings from Last 1 Encounters:  04/02/13 122 lb 9.2 oz (55.6 kg)    Ideal Body Weight: 130 lb  % Ideal Body Weight: 96%  Wt Readings from Last 10 Encounters:  04/02/13 122 lb 9.2 oz (55.6 kg)  03/23/13 130 lb 1.1 oz (59 kg)    Usual Body Weight: unknown, however note  wt change from 130 to 122 lbs in ~1.5 weeks  BMI:  20.5 kg/m2  Estimated Nutritional Needs: Kcal: 1250-1450 Protein: 60-70 gm Fluid: >/= 1.5 L  Skin: Stage II pressure ulcer to sacrum  Diet Order: NPO  EDUCATION NEEDS: -No education needs identified at this time   Intake/Output Summary (Last 24 hours) at 04/03/13 0951 Last data filed at 04/03/13 0500  Gross per 24 hour  Intake    975 ml  Output    350 ml  Net    625 ml     Labs:   Recent Labs Lab 04/02/13 1031 04/03/13 0205  NA 135 136  K 4.2 4.0  CL 101 102  CO2 21 23  BUN 20 27*  CREATININE 0.76 0.70  CALCIUM 7.6* 8.2*  GLUCOSE 166* 138*    CBG (last 3)  No results found for this basename: GLUCAP,  in the last 72 hours  Scheduled Meds: . sodium chloride  3 mL Intravenous Q12H    Continuous Infusions: . sodium chloride      Past Medical History  Diagnosis Date  . Hypertension   . CVA (cerebral infarction)     History reviewed. No pertinent past surgical history.  Loyce Dys, MS RD LDN Clinical Inpatient Dietitian Pager: [phone] Weekend/After hours pager: [phone]

## 2013-04-03 NOTE — Progress Notes (Signed)
NURSING PROGRESS NOTE  CAYCEE WANAT [id number] Transfer Data: 04/03/2013 2:25 PM Attending Provider: Lonia Blood, MD NWG:[id number],HQIONG, MD Code Status: dnr  Cristina Patton is a 79 y.o. female patient transferred from 2H -No acute distress noted.  -No complaints of shortness of breath.  -No complaints of chest pain.   Blood pressure 147/71, pulse 92, temperature 98.2 F (36.8 C), temperature source Oral, resp. rate 18, height 5\' 6"  (1.676 m), weight 56 kg (123 lb 7.3 oz), SpO2 98.00%.   IV Fluids:  IV in place, occlusive dsg intact without redness, IV cath forearm right, condition patent and no redness normal saline.   Allergies:  Review of patient's allergies indicates no known allergies.  Past Medical History:   has a past medical history of Hypertension and CVA (cerebral infarction).  Past Surgical History:   has no past surgical history on file.  Social History:   reports that she has never smoked. She does not have any smokeless tobacco history on file. She reports that she does not drink alcohol or use illicit drugs.  Skin: stage III buttocks - pink foam right mid back pink foam    Will continue to evaluate and treat per MD orders.

## 2013-04-03 NOTE — Progress Notes (Signed)
Patient ID: Cristina Patton  female  RUE:[id number]    DOB: 06-09-1926    DOA: 04/02/2013  PCP: Karlene Einstein, MD  Assessment/Plan: Principal Problem:   GI bleed with acute blood loss anemia; - Likely diverticular painless bleeding versus any colon neoplasm. GI was consulted and discussed with the patient's nephew who is HPOA, plan to continue supportive care. Per Dr. Loreta Ave she is not a candidate for endoscopy procedures and GI has signed off. -Rectal bleeding has decreased, had only slight blood in the bowel movement earlier in the morning, no frank bleeding today - Started on clear liquid diet, Advance solids tomorrow if hemoglobin stays stable and no further bleeding - Repeat hemoglobin 9.6 - Patient was transfused 2 units of packed RBCs, on 04/02/2013  Active Problems:   Acute encephalopathy, leukocytosis: Likely due to UTI - Abdominal x-ray showed no evidence of any free air/perforation - UA shows gross UTI - Also noted that patient was DC'd with a Foley catheter during previous admission on 03/23/2013 with plan to see, Dr. Mena Goes urology in 3 days. However does not appear that patient followed with urology outpatient. - Will start patient on Flomax and DC Foley catheter    CVA (cerebral infarction): Recent CVA on 03/22/2013 -  Patient was recently switched from aspirin to Plavix on 11/21, currently on hold    UTI (urinary tract infection) - Obtain urine culture and sensitivities, blood cultures, started on Rocephin IV.  DVT Prophylaxis:SCDs   Code Status:DO NOT RESUSCITATE   Disposition: TBD, transfer to MedSurg floor   Subjective: No complaints,no further bleeding, hungry   Objective: Weight change:   Intake/Output Summary (Last 24 hours) at 04/03/13 1427 Last data filed at 04/03/13 1219  Gross per 24 hour  Intake    700 ml  Output    550 ml  Net    150 ml   Blood pressure 147/71, pulse 92, temperature 98.2 F (36.8 C), temperature source Oral, resp. rate  18, height 5\' 6"  (1.676 m), weight 56 kg (123 lb 7.3 oz), SpO2 98.00%.  Physical Exam: General: Alert and awake, oriented x3, not in any acute distress. CVS: S1-S2 clear, no murmur rubs or gallops Chest: clear to auscultation bilaterally, no wheezing, rales or rhonchi Abdomen: soft nontender, nondistended, normal bowel sounds  Extremities: no cyanosis, clubbing or edema noted bilaterally  Lab Results: Basic Metabolic Panel:  Recent Labs Lab 04/02/13 1031 04/03/13 0205  NA 135 136  K 4.2 4.0  CL 101 102  CO2 21 23  GLUCOSE 166* 138*  BUN 20 27*  CREATININE 0.76 0.70  CALCIUM 7.6* 8.2*   Liver Function Tests: No results found for this basename: AST, ALT, ALKPHOS, BILITOT, PROT, ALBUMIN,  in the last 168 hours No results found for this basename: LIPASE, AMYLASE,  in the last 168 hours No results found for this basename: AMMONIA,  in the last 168 hours CBC:  Recent Labs Lab 04/02/13 1031 04/03/13 0205 04/03/13 1045  WBC 24.8* 22.3* 20.4*  NEUTROABS 20.8*  --   --   HGB 6.2* 9.8* 9.6*  HCT 18.3* 28.5* 26.5*  MCV 80.3 83.6 81.5  PLT 767* 585* 595*   Cardiac Enzymes:  Recent Labs Lab 04/02/13 1440 04/02/13 1815 04/03/13 0205  TROPONINI <0.30 <0.30 <0.30   BNP: No components found with this basename: POCBNP,  CBG: No results found for this basename: GLUCAP,  in the last 168 hours   Micro Results: Recent Results (from the past 240 hour(s))  CULTURE, BLOOD (ROUTINE  X 2)     Status: None   Collection Time    04/02/13  2:40 PM      Result Value Range Status   Specimen Description BLOOD LEFT HAND   Final   Special Requests BOTTLES DRAWN AEROBIC ONLY 8CCS   Final   Culture  Setup Time     Final   Value: 04/02/2013 21:17     Performed at [facility]   Culture     Final   Value:        BLOOD CULTURE RECEIVED NO GROWTH TO DATE CULTURE WILL BE HELD FOR 5 DAYS BEFORE ISSUING A FINAL NEGATIVE REPORT     Performed at [facility]   Report Status  PENDING   Incomplete  MRSA PCR SCREENING     Status: None   Collection Time    04/02/13  6:33 PM      Result Value Range Status   MRSA by PCR NEGATIVE  NEGATIVE Final   Comment:            The GeneXpert MRSA Assay (FDA     approved for NASAL specimens     only), is one component of a     comprehensive MRSA colonization     surveillance program. It is not     intended to diagnose MRSA     infection nor to guide or     monitor treatment for     MRSA infections.    Studies/Results: Ct Head Wo Contrast  03/20/2013   CLINICAL DATA:  Question of fall.  EXAM: CT HEAD WITHOUT CONTRAST  CT CERVICAL SPINE WITHOUT CONTRAST  TECHNIQUE: Multidetector CT imaging of the head and cervical spine was performed following the standard protocol without intravenous contrast. Multiplanar CT image reconstructions of the cervical spine were also generated.  COMPARISON:  None.  FINDINGS: CT HEAD FINDINGS  No skull fracture or intracranial hemorrhage.  Small vessel disease type changes without CT evidence of large acute infarct.  Mild atrophy.  Vascular calcifications.  No intracranial mass lesion noted on this unenhanced exam.  CT CERVICAL SPINE FINDINGS  No cervical spine fracture.  Head tilt to left. Mild curvature cervical spine. No abnormal prevertebral soft tissue swelling. Question Mild edema posterior to the paraspinal musculature. If there is a high clinical suspicion of ligamentous injury, flexion and extension views or MR can be performed for further delineation.  Scarring lung apices.  IMPRESSION: No intracranial hemorrhage or skull fracture.  No cervical spine fracture detected.  Please see above.   Electronically Signed   By: Bridgett Larsson M.D.   On: 03/20/2013 14:40   Ct Cervical Spine Wo Contrast  03/20/2013   CLINICAL DATA:  Question of fall.  EXAM: CT HEAD WITHOUT CONTRAST  CT CERVICAL SPINE WITHOUT CONTRAST  TECHNIQUE: Multidetector CT imaging of the head and cervical spine was performed following  the standard protocol without intravenous contrast. Multiplanar CT image reconstructions of the cervical spine were also generated.  COMPARISON:  None.  FINDINGS: CT HEAD FINDINGS  No skull fracture or intracranial hemorrhage.  Small vessel disease type changes without CT evidence of large acute infarct.  Mild atrophy.  Vascular calcifications.  No intracranial mass lesion noted on this unenhanced exam.  CT CERVICAL SPINE FINDINGS  No cervical spine fracture.  Head tilt to left. Mild curvature cervical spine. No abnormal prevertebral soft tissue swelling. Question Mild edema posterior to the paraspinal musculature. If there is a high clinical suspicion of ligamentous  injury, flexion and extension views or MR can be performed for further delineation.  Scarring lung apices.  IMPRESSION: No intracranial hemorrhage or skull fracture.  No cervical spine fracture detected.  Please see above.   Electronically Signed   By: Bridgett Larsson M.D.   On: 03/20/2013 14:40   Mr Shirlee Latch Wo Contrast  03/22/2013   CLINICAL DATA:  79 year old female with hypertension, found in a confused state with altered mental status. Initial encounter.  EXAM: MRI HEAD WITHOUT CONTRAST  MRA HEAD WITHOUT CONTRAST  TECHNIQUE: Multiplanar, multiecho pulse sequences of the brain and surrounding structures were obtained without intravenous contrast. Angiographic images of the head were obtained using MRA technique without contrast.  COMPARISON:  Head and cervical spine CT 03/20/2013.  FINDINGS: MRI HEAD FINDINGS  Cerebral volume is within normal limits for age. Major intracranial vascular flow voids are preserved. 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 Otherwise no midline shift, mass effect, or evidence of intracranial mass lesion.  There is a patchy sub cm area of restricted diffusion in the right inferior cerebellum (series 4, image 7). There is minimal associated T2 hyperintensity (series  5, image 6). No mass effect or hemorrhage.  No other restricted diffusion in the brain. No ventriculomegaly, extra-axial collection or acute intracranial hemorrhage. Cervicomedullary junction and pituitary are within normal limits. Negative visualized cervical spine.  Mild for age periventricular white matter T2 and FLAIR hyperintensity in a nonspecific configuration. No supratentorial cortical encephalomalacia. Mild T2 heterogeneity in the deep gray matter nuclei, might all relate to perivascular spaces.  Visualized orbit soft tissues are within normal limits. Visualized paranasal sinuses and mastoids are clear. Normal bone marrow signal. Visualized scalp soft tissues are within normal limits.  MRA HEAD FINDINGS  Antegrade flow in the posterior circulation with dominant distal right vertebral artery. Suspect the left vertebral artery terminates in PICA. Mild distal right vertebral dolichoectasia. No proximal basilar artery stenosis.  There is distal basilar artery moderate stenosis just proximal to the SCA origins (series 7, image 57 and series 702, image 8). Still, the SCA and PCA origins are patent. The posterior communicating arteries are diminutive or absent.  There is moderate to severe bilateral PCA irregularity with bilateral P2 segment stenoses. There is bilateral preserved distal PCA flow.  Antegrade flow in both ICA siphons. Mild ICA irregularity and no significant ICA stenosis. Ophthalmic artery origins are within normal limits. Patent carotid termini. MCA and ACA origins are within normal limits.  Anterior communicating artery is within normal limits. There is a moderate to severe stenosis of the left ACA A2 segment with preserved distal flow.  Both MCA M1 segments are patent with only mild irregularity. There is mild stenosis at both MCA bifurcations, but the sylvian branches remain patent. Moderate irregularity in the bilateral MCA branches with moderate to severe bilateral M2 stenoses (series 7,  image 62, series 704, image 8).  IMPRESSION: 1. Small acute right inferior cerebellar infarct. No mass effect or hemorrhage.  2. Otherwise mild for age nonspecific cerebral white matter signal changes.  3. MRA reveals moderate to severe intracranial medium vessel atherosclerosis, with moderate to severe bilateral P2, bilateral M2, and left A2 stenoses. There is preserved distal flow in these branches.  4. There is also a moderate distal basilar artery stenosis, just proximal to the SCA and PCA origins, and the posterior communicating arteries are diminutive or absent.   Electronically Signed   By: Si Gaul.D.  On: 03/22/2013 10:28   Mr Brain Wo Contrast  03/22/2013   CLINICAL DATA:  79 year old female with hypertension, found in a confused state with altered mental status. Initial encounter.  EXAM: MRI HEAD WITHOUT CONTRAST  MRA HEAD WITHOUT CONTRAST  TECHNIQUE: Multiplanar, multiecho pulse sequences of the brain and surrounding structures were obtained without intravenous contrast. Angiographic images of the head were obtained using MRA technique without contrast.  COMPARISON:  Head and cervical spine CT 03/20/2013.  FINDINGS: MRI HEAD FINDINGS  Cerebral volume is within normal limits for age. Major intracranial vascular flow voids are preserved. 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 Otherwise no midline shift, mass effect, or evidence of intracranial mass lesion.  There is a patchy sub cm area of restricted diffusion in the right inferior cerebellum (series 4, image 7). There is minimal associated T2 hyperintensity (series 5, image 6). No mass effect or hemorrhage.  No other restricted diffusion in the brain. No ventriculomegaly, extra-axial collection or acute intracranial hemorrhage. Cervicomedullary junction and pituitary are within normal limits. Negative visualized cervical spine.  Mild for age periventricular white matter T2 and FLAIR  hyperintensity in a nonspecific configuration. No supratentorial cortical encephalomalacia. Mild T2 heterogeneity in the deep gray matter nuclei, might all relate to perivascular spaces.  Visualized orbit soft tissues are within normal limits. Visualized paranasal sinuses and mastoids are clear. Normal bone marrow signal. Visualized scalp soft tissues are within normal limits.  MRA HEAD FINDINGS  Antegrade flow in the posterior circulation with dominant distal right vertebral artery. Suspect the left vertebral artery terminates in PICA. Mild distal right vertebral dolichoectasia. No proximal basilar artery stenosis.  There is distal basilar artery moderate stenosis just proximal to the SCA origins (series 7, image 57 and series 702, image 8). Still, the SCA and PCA origins are patent. The posterior communicating arteries are diminutive or absent.  There is moderate to severe bilateral PCA irregularity with bilateral P2 segment stenoses. There is bilateral preserved distal PCA flow.  Antegrade flow in both ICA siphons. Mild ICA irregularity and no significant ICA stenosis. Ophthalmic artery origins are within normal limits. Patent carotid termini. MCA and ACA origins are within normal limits.  Anterior communicating artery is within normal limits. There is a moderate to severe stenosis of the left ACA A2 segment with preserved distal flow.  Both MCA M1 segments are patent with only mild irregularity. There is mild stenosis at both MCA bifurcations, but the sylvian branches remain patent. Moderate irregularity in the bilateral MCA branches with moderate to severe bilateral M2 stenoses (series 7, image 62, series 704, image 8).  IMPRESSION: 1. Small acute right inferior cerebellar infarct. No mass effect or hemorrhage.  2. Otherwise mild for age nonspecific cerebral white matter signal changes.  3. MRA reveals moderate to severe intracranial medium vessel atherosclerosis, with moderate to severe bilateral P2, bilateral  M2, and left A2 stenoses. There is preserved distal flow in these branches.  4. There is also a moderate distal basilar artery stenosis, just proximal to the SCA and PCA origins, and the posterior communicating arteries are diminutive or absent.   Electronically Signed   By: Augusto Gamble M.D.   On: 03/22/2013 10:28   Dg Chest Port 1 View  04/02/2013   CLINICAL DATA:  Hypertension, elevated white blood cell count  EXAM: PORTABLE CHEST - 1 VIEW  COMPARISON:  March 20 2013  FINDINGS: The heart size and mediastinal contours are within normal  limits. There is no focal infiltrate, pulmonary edema, or pleural effusion. The lung volumes are low. The visualized skeletal structures are stable.  IMPRESSION: No active cardiopulmonary disease.   Electronically Signed   By: Sherian Rein M.D.   On: 04/02/2013 14:05   Dg Chest Port 1 View  03/20/2013   CLINICAL DATA:  Confusion and weakness.  EXAM: PORTABLE CHEST - 1 VIEW  COMPARISON:  01/13/2012.  FINDINGS: Mediastinum and hilar structures are normal. Stable elevation left hemidiaphragm present. Mild basilar atelectasis. Poor inspiration. Heart size normal. Pulmonary vascularity normal. No acute bony abnormality.  IMPRESSION: Poor inspiration. Mild atelectasis left lung base. Stable elevation left hemidiaphragm . No significant abnormality otherwise noted.   Electronically Signed   By: Maisie Fus  Register   On: 03/20/2013 14:18   Dg Abd 2 Views  04/02/2013   CLINICAL DATA:  Rectal bleed, elevated white blood cell count.  EXAM: ABDOMEN - 2 VIEW  COMPARISON:  None.  FINDINGS: The bowel gas pattern is normal. There is no evidence of free air. Extensive bowel content is identified in the colon. A radiopaque needle is projected in the lower pelvis.  IMPRESSION: A radiopaque opaque needle is projected in the lower pelvis. Constipation.   Electronically Signed   By: Sherian Rein M.D.   On: 04/02/2013 13:21    Medications: Scheduled Meds: . amLODipine  5 mg Oral Daily  .  cefTRIAXone (ROCEPHIN)  IV  1 g Intravenous Q24H  . feeding supplement (RESOURCE BREEZE)  1 Container Oral TID BM  . sodium chloride  3 mL Intravenous Q12H  . tamsulosin  0.4 mg Oral Daily      LOS: 1 day   RAI,RIPUDEEP M.D. [facility] 04/03/2013, 2:27 PM Pager: [phone]  If 7PM-7AM, please contact night-coverage [email or web address] Password [email or web address]

## 2013-04-04 DIAGNOSIS — I10 Essential (primary) hypertension: Secondary | ICD-10-CM

## 2013-04-04 DIAGNOSIS — N390 Urinary tract infection, site not specified: Secondary | ICD-10-CM

## 2013-04-04 DIAGNOSIS — E860 Dehydration: Secondary | ICD-10-CM

## 2013-04-04 LAB — BASIC METABOLIC PANEL
BUN: 21 mg/dL (ref 6–23)
CO2: 24 mEq/L (ref 19–32)
Chloride: 99 mEq/L (ref 96–112)
GFR calc Af Amer: >90 mL/min (ref >90)
Glucose, Bld: 213 mg/dL — ABNORMAL HIGH (ref 70–99)
Potassium: 3.5 mEq/L (ref 3.5–5.1)
Sodium: 132 mEq/L — ABNORMAL LOW (ref 135–145)

## 2013-04-04 LAB — URINE CULTURE

## 2013-04-04 LAB — CBC
HCT: 25 % — ABNORMAL LOW (ref 36.0–46.0)
HCT: 25.1 % — ABNORMAL LOW (ref 36.0–46.0)
Hemoglobin: 8.6 g/dL — ABNORMAL LOW (ref 12.0–15.0)
Hemoglobin: 8.5 g/dL — ABNORMAL LOW (ref 12.0–15.0)
MCH: 28.5 pg (ref 26.0–34.0)
MCHC: 33.9 g/dL (ref 30.0–36.0)
MCV: 83.6 fL (ref 78.0–100.0)
MCV: 84.2 fL (ref 78.0–100.0)
RDW: 16.1 % — ABNORMAL HIGH (ref 11.5–15.5)
WBC: 14.7 10*3/uL — ABNORMAL HIGH (ref 4.0–10.5)

## 2013-04-04 MED ORDER — POLYETHYLENE GLYCOL 3350 17 G PO PACK
17.0000 g | PACK | Freq: Every day | ORAL | Status: DC
  Administered 2013-04-04 – 2013-04-05 (×2): 17 g via ORAL
  Filled 2013-04-04 (×3): qty 1

## 2013-04-04 MED ORDER — MUPIROCIN CALCIUM 2 % EX CREA
TOPICAL_CREAM | Freq: Two times a day (BID) | CUTANEOUS | Status: DC
  Administered 2013-04-04 – 2013-04-05 (×2): via TOPICAL
  Filled 2013-04-04 (×2): qty 15

## 2013-04-04 MED ORDER — HYDROCORTISONE ACE-PRAMOXINE 1-1 % RE FOAM
1.0000 | Freq: Two times a day (BID) | RECTAL | Status: DC
  Filled 2013-04-04 (×2): qty 10

## 2013-04-04 MED ORDER — DEXTROSE-NACL 5-0.9 % IV SOLN: INTRAVENOUS | Status: DC

## 2013-04-04 MED ORDER — KCL IN DEXTROSE-NACL 20-5-0.9 MEQ/L-%-% IV SOLN
INTRAVENOUS | Status: DC
  Filled 2013-04-04: qty 1000

## 2013-04-04 NOTE — Consult Note (Signed)
WOC wound consult note Reason for Consult: Consult requested for back wound.  Pt does not know the etiology of this wound or the one located on her buttock.  Wound shape and appearance is NOT consistent with a pressure ulcer.  No family present to discuss site. Wound type: Full thickness  .8X.3cm, 100% yellow slough, mod amt yellow drainage.  No odor.  Surrounded by erythremia to 1 cm of wound edges. Plan: Bactroban to provide antimicrobial benefits and promote healing.  Foam dressing to absorb drainage and protect from further injury.  Pressure Ulcer POA: Yes to left buttock; Stage 2 surrounded by patchy areas of partial thickness skin loss Measurement:4X1X.1cm Wound bed:100% pink and dry Drainage (amount, consistency, odor) no odor or drainage Periwound: intact skin surrounding Dressing procedure/placement/frequency: Foam dressing has been applied to protect and promote healing.   Please re-consult if further assistance is needed.  Thank-you,  Cammie Mcgee MSN, RN, CWOCN, [place], CNS [phone]

## 2013-04-04 NOTE — Progress Notes (Signed)
[facility] PROGRESS NOTE  Cristina Patton ZOX:[id number] DOB: Oct 17, 1926 DOA: 04/02/2013 PCP: Karlene Einstein, MD  Assessment/Plan: GI bleed with acute blood loss anemia  - Diverticular painless bleeding versus colon neoplasm per GI. - Also has small external hemorrhoid. - Patient was transfused 2 units of packed RBCs, on 04/02/2013  - Abdominal x-ray showed no evidence of any free air/perforation - GI spoke with HPOA (nephew). Plan is supportive care. - Not candidate for endoscopy. GI signed off (Dr. Loreta Ave) 11/2 - Nurse states that patient has small amount bright red blood in diaper today AM.  - Advance diet to heart healthy from clears. - Hemoglobin 8.6. Improved from 6.2 on admission. Will monitor tomorrow AM  Urinary Tract Infection - UA shows gross UTI - Also noted that patient was DC'd with a Foley catheter during previous admission on 03/23/2013 with plan to see, Dr. Mena Goes urology in 3 days. However does not appear that patient followed with urology outpatient.  - Patient started on Flomax and DC Foley catheter 12/2 - Urine culture negative. Blood culture pending. - started on Rocephin IV 12/2 - WBCs trending down today 14.7. Will reassess tomorrow AM - Foley catheter reinserted today 12/3  Sacral and Upper back Ulcers - Will consult wound care  Acute encephalopathy, leukocytosis - Likely due to UTI  - Patient appears cognizant today. No deficits noted. Family is convinced that she is not confused.  CVA (cerebral infarction)  - Recent CVA on 03/22/2013  - Patient was recently switched from aspirin to Plavix on 11/21, currently on hold   Hypertension - Stable. Continue home meds  Code Status: FULL Family Communication: Nephew has been included in decision making via telephone. Disposition Plan: Remain inpatient.   Consultants:  Gastroenterology, Dr. Loreta Ave  Procedures:  none  Antibiotics:  Rocephin, 12/2, Day 2  HPI/Subjective: Patient is  feeling weak but is better than before.  States that she has not seen any blood in diaper. Nurse states there was bright red blood in diaper today. Denies, nausea, vomiting, fever, confusion, abdominal pain, trouble swallowing, SOB, chest pain.   Objective: Filed Vitals:   04/04/13 0520  BP: 137/72  Pulse: 89  Temp: 98.2 F (36.8 C)  Resp: 18    Intake/Output Summary (Last 24 hours) at 04/04/13 1010 Last data filed at 04/04/13 0800  Gross per 24 hour  Intake    410 ml  Output    550 ml  Net   -140 ml   Filed Weights   04/02/13 1715 04/03/13 1354  Weight: 55.6 kg (122 lb 9.2 oz) 56 kg (123 lb 7.3 oz)    Exam:   General:  NAD. Patient is pleasant lying in bed. Alert and oriented x3  Cardiovascular: RRR, holosystolic murmur heard at right sternal boarder. No rubs or gallops.  Respiratory: CTAB, no crackles, rales, rhonchi.  GI: +BS, no tenderness to palpation in all four quadrants, external hemorrhoid noted at 6 O'clock. Trace blood surrounding. rectum.  Musculoskeletal: Muscle atrophy noted in all four extremities, no erythema, or edema noted.  Skin: Moderate skin breakdown on sacrum and and upper right back. Upper right back is one ulcerative lesion about 2cm in diameter with purulent discharge noted. The sacral ulcer is 3-4 distinct circular lesions in  Vertical linear pattern right of midline.  Data Reviewed: Basic Metabolic Panel:  Recent Labs Lab 04/02/13 1031 04/03/13 0205 04/04/13 0226  NA 135 136 132*  K 4.2 4.0 3.5  CL 101 102 99  CO2 21 23  24  GLUCOSE 166* 138* 213*  BUN 20 27* 21  CREATININE 0.76 0.70 0.56  CALCIUM 7.6* 8.2* 7.9*   Liver Function Tests: No results found for this basename: AST, ALT, ALKPHOS, BILITOT, PROT, ALBUMIN,  in the last 168 hours No results found for this basename: LIPASE, AMYLASE,  in the last 168 hours No results found for this basename: AMMONIA,  in the last 168 hours CBC:  Recent Labs Lab 04/02/13 1031 04/03/13 0205  04/03/13 1045 04/03/13 1840 04/04/13 0226  WBC 24.8* 22.3* 20.4* 17.9* 14.7*  NEUTROABS 20.8*  --   --   --   --   HGB 6.2* 9.8* 9.6* 8.8* 8.6*  HCT 18.3* 28.5* 26.5* 25.2* 25.0*  MCV 80.3 83.6 81.5 83.4 83.6  PLT 767* 585* 595* 558* 562*   Cardiac Enzymes:  Recent Labs Lab 04/02/13 1440 04/02/13 1815 04/03/13 0205  TROPONINI <0.30 <0.30 <0.30   BNP (last 3 results) No results found for this basename: PROBNP,  in the last 8760 hours CBG: No results found for this basename: GLUCAP,  in the last 168 hours  Recent Results (from the past 240 hour(s))  CULTURE, BLOOD (ROUTINE X 2)     Status: None   Collection Time    04/02/13  2:40 PM      Result Value Range Status   Specimen Description BLOOD LEFT HAND   Final   Special Requests BOTTLES DRAWN AEROBIC ONLY 8CCS   Final   Culture  Setup Time     Final   Value: 04/02/2013 21:17     Performed at [facility]   Culture     Final   Value:        BLOOD CULTURE RECEIVED NO GROWTH TO DATE CULTURE WILL BE HELD FOR 5 DAYS BEFORE ISSUING A FINAL NEGATIVE REPORT     Performed at [facility]   Report Status PENDING   Incomplete  CULTURE, BLOOD (ROUTINE X 2)     Status: None   Collection Time    04/02/13  6:15 PM      Result Value Range Status   Specimen Description BLOOD LEFT HAND   Final   Special Requests BOTTLES DRAWN AEROBIC ONLY 10CC   Final   Culture  Setup Time     Final   Value: 04/03/2013 02:11     Performed at [facility]   Culture     Final   Value:        BLOOD CULTURE RECEIVED NO GROWTH TO DATE CULTURE WILL BE HELD FOR 5 DAYS BEFORE ISSUING A FINAL NEGATIVE REPORT     Performed at [facility]   Report Status PENDING   Incomplete  MRSA PCR SCREENING     Status: None   Collection Time    04/02/13  6:33 PM      Result Value Range Status   MRSA by PCR NEGATIVE  NEGATIVE Final   Comment:            The GeneXpert MRSA Assay (FDA     approved for NASAL specimens     only), is one  component of a     comprehensive MRSA colonization     surveillance program. It is not     intended to diagnose MRSA     infection nor to guide or     monitor treatment for     MRSA infections.  URINE CULTURE     Status: None   Collection  Time    04/02/13  6:40 PM      Result Value Range Status   Specimen Description URINE, CATHETERIZED   Final   Special Requests NONE   Final   Culture  Setup Time     Final   Value: 04/02/2013 22:24     Performed at [facility]   Colony Count     Final   Value: NO GROWTH     Performed at [facility]   Culture     Final   Value: NO GROWTH     Performed at [facility]   Report Status 04/04/2013 FINAL   Final     Studies: Dg Chest Port 1 View  04/02/2013   CLINICAL DATA:  Hypertension, elevated white blood cell count  EXAM: PORTABLE CHEST - 1 VIEW  COMPARISON:  March 20 2013  FINDINGS: The heart size and mediastinal contours are within normal limits. There is no focal infiltrate, pulmonary edema, or pleural effusion. The lung volumes are low. The visualized skeletal structures are stable.  IMPRESSION: No active cardiopulmonary disease.   Electronically Signed   By: Sherian Rein M.D.   On: 04/02/2013 14:05   Dg Abd 2 Views  04/02/2013   CLINICAL DATA:  Rectal bleed, elevated white blood cell count.  EXAM: ABDOMEN - 2 VIEW  COMPARISON:  None.  FINDINGS: The bowel gas pattern is normal. There is no evidence of free air. Extensive bowel content is identified in the colon. A radiopaque needle is projected in the lower pelvis.  IMPRESSION: A radiopaque opaque needle is projected in the lower pelvis. Constipation.   Electronically Signed   By: Sherian Rein M.D.   On: 04/02/2013 13:21    Scheduled Meds: . amLODipine  5 mg Oral Daily  . cefTRIAXone (ROCEPHIN)  IV  1 g Intravenous Q24H  . feeding supplement (RESOURCE BREEZE)  1 Container Oral TID BM  . hydrocortisone-pramoxine  1 applicator Rectal BID  . polyethylene glycol   17 g Oral Daily  . sodium chloride  3 mL Intravenous Q12H  . tamsulosin  0.4 mg Oral Daily   Continuous Infusions:   Principal Problem:   GI bleed Active Problems:   Acute encephalopathy   Acute blood loss anemia   CVA (cerebral infarction)   UTI (urinary tract infection)       Georgina Quint, PA-S [facility]  [facility] Pager [phone]. If 7PM-7AM, please contact night-coverage at [email or web address], password [email or web address] 04/04/2013, 10:10 AM  LOS: 2 days   I personally examined patient on 04/04/13, reports feeling better. She is awake and alert, tolerating clear liquid diet. Per RN did have some bright red blood on pad this morning. Her H&H remains stable, at 8.6 and 25.1 respectively. GI having no further recommendations. Plan to advance diet today. She remains of rocephin therapy for presumed UTI, although has chronic indwelling foley catheter. I anticipate discharge in the next 24 hours if she remains stable.

## 2013-04-04 NOTE — Progress Notes (Signed)
Noted pt.had only smear on her pad,;tried to let her void in a commode & she had a bowel movement & voided only 50cc.Bladder scan done  560cc noted.MD on call was called & ordered in & out cath.

## 2013-04-05 LAB — CBC
HCT: 25.4 % — ABNORMAL LOW (ref 36.0–46.0)
Hemoglobin: 8.8 g/dL — ABNORMAL LOW (ref 12.0–15.0)
MCHC: 34.6 g/dL (ref 30.0–36.0)
WBC: 11.9 10*3/uL — ABNORMAL HIGH (ref 4.0–10.5)

## 2013-04-05 MED ORDER — POLYETHYLENE GLYCOL 3350 17 G PO PACK: 17.0000 g | PACK | Freq: Every day | ORAL | Status: DC

## 2013-04-05 MED ORDER — HYDROCORTISONE 2.5 % RE CREA: TOPICAL_CREAM | Freq: Three times a day (TID) | RECTAL | Status: DC

## 2013-04-05 MED ORDER — ACETAMINOPHEN 325 MG PO TABS: 650.0000 mg | ORAL_TABLET | Freq: Four times a day (QID) | ORAL | Status: DC | PRN

## 2013-04-05 MED ORDER — CLOPIDOGREL BISULFATE 75 MG PO TABS: 75.0000 mg | ORAL_TABLET | Freq: Every day | ORAL | Status: DC

## 2013-04-05 MED ORDER — AMLODIPINE BESYLATE 5 MG PO TABS: 5.0000 mg | ORAL_TABLET | Freq: Every day | ORAL | Status: DC

## 2013-04-05 MED ORDER — DOCUSATE SODIUM 100 MG PO CAPS: 100.0000 mg | ORAL_CAPSULE | Freq: Every day | ORAL | Status: DC

## 2013-04-05 MED ORDER — FOSFOMYCIN TROMETHAMINE 3 G PO PACK
3.0000 g | PACK | Freq: Once | ORAL | Status: DC
Start: 2013-04-05 — End: 2013-04-05
  Filled 2013-04-05: qty 3

## 2013-04-05 MED ORDER — HYDROCORTISONE 2.5 % RE CREA
TOPICAL_CREAM | Freq: Three times a day (TID) | RECTAL | Status: DC
  Filled 2013-04-05: qty 28.35

## 2013-04-05 MED ORDER — HYDROCORTISONE ACE-PRAMOXINE 1-1 % RE FOAM: 1.0000 | Freq: Two times a day (BID) | RECTAL | Status: DC

## 2013-04-05 NOTE — Clinical Documentation Improvement (Signed)
To Hospitalist MD's,NP's and PA's  Patient with "Severe malnutrition in the context of chronic illness " per nutritional consult with  "decreased intake and 8 lbs of wt change (6% in <2 weeks) ".   Please document appropriate secondary diagnosis.    Possible Clinical Conditions?  Severe Malnutrition   Protein Calorie Malnutrition Severe Protein Calorie Malnutrition Other Condition Cannot clinically determine  Risk Factors: Rectal bleeding, CVA, Hypertension   Recommendation:  Supplements; Resource Breeze po TID, each supplement provides 250 kcal and 9 grams of protein.  Glucerna Shake twice daily once diet resumed (220 kcals, 10 gm protein per 8 fl oz bottle)      Thank You, Lavonda Jumbo ,RN Clinical Documentation Specialist:  [phone]  [facility]- Health Information Management

## 2013-04-05 NOTE — Discharge Summary (Signed)
Physician Discharge Summary  Cristina Patton ZOX:[id number] DOB: Oct 02, 1926 DOA: 04/02/2013  PCP: Karlene Einstein, MD  Admit date: 04/02/2013 Discharge date: 04/05/2013  Time spent: 60 minutes  Recommendations for Outpatient Follow-up:  CBC on Friday 12/5.  Follow Hgb as patient has on-going low level GI Bleeding. Plavix to be restarted on December 5. Please monitor her for any increased rectal bleeding. Followup with urology early during the week of December 8 for urinary retention with Foley catheter in place Patient will need a bowel regimen. She was admitted with severe constipation.  Discharge Diagnoses:  Principal Problem:   GI bleed Active Problems:   Acute encephalopathy   Acute blood loss anemia   CVA (cerebral infarction)   UTI (urinary tract infection)   Discharge Condition: stable.  Much improved.  Diet recommendation: Heart Healthy  Filed Weights   04/02/13 1715 04/03/13 1354  Weight: 55.6 kg (122 lb 9.2 oz) 56 kg (123 lb 7.3 oz)    History of present illness:  79 year old female with a recent history of stroke in November 2014 (for which she was placed on Plavix) presented to the ER with bright red blood per rectum x several days. Further she had altered mental status and lethargy. Her hemoglobin was 6.7.   Hospital Course:  GI bleed with acute blood loss anemia  - painless bleeding:  Hemorrhoidal bleeding, plus minus diverticular bleeding, plus minus colon neoplasm per GI.  - has small external hemorrhoid.  - Patient was transfused 2 units of packed RBCs, on 04/02/2013  - Abdominal x-ray showed no evidence of any free air/perforation  - GI was consulted, evaluated the patient and spoke with HPOA (nephew). Recommendation is supportive care.  - Not candidate for endoscopy. GI signed off (Dr. Loreta Ave) 11/2  - Nurse states that patient has small amount bright red blood in diaper today AM. This is not unexpected with hemorrhoids - Hemoglobin 8.6. Improved from  6.2 with blood transfusion  Urinary Tract Infection  - UA shows gross UTI - wbc's, bacteria - Also noted that patient was DC'd with a Foley catheter during previous admission on 03/23/2013 with plan to see, Dr. Mena Goes urology in 3 days. However does not appear that patient followed with urology outpatient.  - Urine culture negative.  - started on Rocephin IV 12/2, will give one dose of fosfomycin on 12/4 to finalize antibiotic therapy. - Foley catheter reinserted 12/3   Urinary retention - Patient started on Flomax and DC Foley catheter 12/2 - Unfortunately Foley catheter had to be reinserted on December 3. - Recommend that patient follows up with Dr. Mena Goes [facility] urology) early next week.   Acute encephalopathy, leukocytosis  - Likely due to UTI  - Patient has returned to baseline. She is only mildly confused.  CVA (cerebral infarction)  - Recent CVA on 03/22/2013  - Patient was recently switched from aspirin to Plavix on 11/21 - Plavix was held during this admission, but will request it be restarted at the time of discharge with careful monitoring for increased rectal bleeding  Hypertension  - Stable. Continue home meds   Wounds  -Sacral and upper back wounds. One does appear to be a pressure ulceration.  -Wound care consulted and recommended Bactroban ointment and foam dressing.     Procedures:  Blood transfusion  Consultations:  Gastroenterology  Discharge Exam: Filed Vitals:   04/05/13 0539  BP: 131/69  Pulse: 80  Temp: 98.5 F (36.9 C)  Resp: 18   General: NAD. Patient is pleasant lying  in bed. Alert and oriented, but mildly confused Cardiovascular: RRR, holosystolic murmur heard at right sternal boarder. No rubs or gallops. No lower extremity edema Respiratory: CTAB, no crackles, rales, rhonchi. No accessory muscle use GI: +BS, no tenderness to palpation in all four quadrants, external hemorrhoid noted at 6 O'clock. Trace blood surrounding. rectum.   Musculoskeletal: Muscle atrophy noted in all four extremities, no erythema, or edema noted.  Skin: Moderate skin breakdown on sacrum and and upper right back. Upper right back is one ulcerative lesion about 2cm in diameter with purulent discharge noted. The sacral ulcer is 3-4 distinct circular lesions in Vertical linear pattern right of midline. No rash.    Discharge Instructions      Discharge Orders   Future Orders Complete By Expires   Diet - low sodium heart healthy  As directed    Increase activity slowly  As directed        Medication List         acetaminophen 325 MG tablet  Commonly known as:  TYLENOL  Take 2 tablets (650 mg total) by mouth every 6 (six) hours as needed for mild pain (or Fever >/= 101).     amLODipine 5 MG tablet  Commonly known as:  NORVASC  Take 1 tablet (5 mg total) by mouth daily.     clopidogrel 75 MG tablet  Commonly known as:  PLAVIX  Take 1 tablet (75 mg total) by mouth daily.  Start taking on:  04/06/2013     docusate sodium 100 MG capsule  Commonly known as:  COLACE  Take 1 capsule (100 mg total) by mouth at bedtime.     hydrocortisone 2.5 % rectal cream  Commonly known as:  ANUSOL-HC  Place rectally 3 (three) times daily.     hydrocortisone-pramoxine rectal foam  Commonly known as:  PROCTOFOAM-HC  Place 1 applicator rectally 2 (two) times daily.     lactose free nutrition Liqd  Take 237 mLs by mouth 2 (two) times daily between meals.     polyethylene glycol packet  Commonly known as:  MIRALAX / GLYCOLAX  Take 17 g by mouth daily. Mix in 1/2 cup (4 ounces) of liquid     tamsulosin 0.4 MG Caps capsule  Commonly known as:  FLOMAX  Take 1 capsule (0.4 mg total) by mouth daily.       No Known Allergies Follow-up Information   Follow up with Eskridge, Lowella Petties, MD. Schedule an appointment as soon as possible for a visit in 4 days. (Urinary retention, Foley in place)    Specialty:  Urology   Contact information:   [address] [place] [address] [phone]        The results of significant diagnostics from this hospitalization (including imaging, microbiology, ancillary and laboratory) are listed below for reference.    Significant Diagnostic Studies:  Mr Shirlee Latch Wo Contrast  03/22/2013   CLINICAL DATA:  78 year old female with hypertension, found in a confused state with altered mental status. Initial encounter.  EXAM: MRI HEAD WITHOUT CONTRAST  MRA HEAD WITHOUT CONTRAST  TECHNIQUE: Multiplanar, multiecho pulse sequences of the brain and surrounding structures were obtained without intravenous contrast. Angiographic images of the head were obtained using MRA technique without contrast.  COMPARISON:  Head and cervical spine CT 03/20/2013.  FINDINGS: MRI HEAD FINDINGS  Cerebral volume is within normal limits for age. Major intracranial vascular flow voids are preserved. There is dolichoectasia of the distal right vertebral  artery with mild impression on the medulla. Otherwise no midline shift, mass effect, or evidence of intracranial mass lesion.  There is a patchy sub cm area of restricted diffusion in the right inferior cerebellum (series 4, image 7). There is minimal associated T2 hyperintensity (series 5, image 6). No mass effect or hemorrhage.  No other restricted diffusion in the brain. No ventriculomegaly, extra-axial collection or acute intracranial hemorrhage. Cervicomedullary junction and pituitary are within normal limits. Negative visualized cervical spine.  Mild for age periventricular white matter T2 and FLAIR hyperintensity in a nonspecific configuration. No supratentorial cortical encephalomalacia. Mild T2 heterogeneity in the deep gray matter nuclei, might all relate to perivascular spaces.  Visualized orbit soft tissues are within normal limits. Visualized paranasal sinuses and mastoids are clear. Normal bone marrow signal. Visualized scalp soft tissues are within normal limits.   MRA HEAD FINDINGS  Antegrade flow in the posterior circulation with dominant distal right vertebral artery. Suspect the left vertebral artery terminates in PICA. Mild distal right vertebral dolichoectasia. No proximal basilar artery stenosis.  There is distal basilar artery moderate stenosis just proximal to the SCA origins (series 7, image 57 and series 702, image 8). Still, the SCA and PCA origins are patent. The posterior communicating arteries are diminutive or absent.  There is moderate to severe bilateral PCA irregularity with bilateral P2 segment stenoses. There is bilateral preserved distal PCA flow.  Antegrade flow in both ICA siphons. Mild ICA irregularity and no significant ICA stenosis. Ophthalmic artery origins are within normal limits. Patent carotid termini. MCA and ACA origins are within normal limits.  Anterior communicating artery is within normal limits. There is a moderate to severe stenosis of the left ACA A2 segment with preserved distal flow.  Both MCA M1 segments are patent with only mild irregularity. There is mild stenosis at both MCA bifurcations, but the sylvian branches remain patent. Moderate irregularity in the bilateral MCA branches with moderate to severe bilateral M2 stenoses (series 7, image 62, series 704, image 8).  IMPRESSION: 1. Small acute right inferior cerebellar infarct. No mass effect or hemorrhage.  2. Otherwise mild for age nonspecific cerebral white matter signal changes.  3. MRA reveals moderate to severe intracranial medium vessel atherosclerosis, with moderate to severe bilateral P2, bilateral M2, and left A2 stenoses. There is preserved distal flow in these branches.  4. There is also a moderate distal basilar artery stenosis, just proximal to the SCA and PCA origins, and the posterior communicating arteries are diminutive or absent.   Electronically Signed   By: Augusto Gamble M.D.   On: 03/22/2013 10:28   Mr Brain Wo Contrast  03/22/2013   CLINICAL DATA:   79 year old female with hypertension, found in a confused state with altered mental status. Initial encounter.  EXAM: MRI HEAD WITHOUT CONTRAST  MRA HEAD WITHOUT CONTRAST  TECHNIQUE: Multiplanar, multiecho pulse sequences of the brain and surrounding structures were obtained without intravenous contrast. Angiographic images of the head were obtained using MRA technique without contrast.  COMPARISON:  Head and cervical spine CT 03/20/2013.  FINDINGS: MRI HEAD FINDINGS  Cerebral volume is within normal limits for age. Major intracranial vascular flow voids are preserved. 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 Otherwise no midline shift, mass effect, or evidence of intracranial mass lesion.  There is a patchy sub cm area of restricted diffusion in the right inferior cerebellum (series 4, image 7). There is minimal associated T2 hyperintensity (series 5,  image 6). No mass effect or hemorrhage.  No other restricted diffusion in the brain. No ventriculomegaly, extra-axial collection or acute intracranial hemorrhage. Cervicomedullary junction and pituitary are within normal limits. Negative visualized cervical spine.  Mild for age periventricular white matter T2 and FLAIR hyperintensity in a nonspecific configuration. No supratentorial cortical encephalomalacia. Mild T2 heterogeneity in the deep gray matter nuclei, might all relate to perivascular spaces.  Visualized orbit soft tissues are within normal limits. Visualized paranasal sinuses and mastoids are clear. Normal bone marrow signal. Visualized scalp soft tissues are within normal limits.  MRA HEAD FINDINGS  Antegrade flow in the posterior circulation with dominant distal right vertebral artery. Suspect the left vertebral artery terminates in PICA. Mild distal right vertebral dolichoectasia. No proximal basilar artery stenosis.  There is distal basilar artery moderate stenosis just proximal to the SCA origins  (series 7, image 57 and series 702, image 8). Still, the SCA and PCA origins are patent. The posterior communicating arteries are diminutive or absent.  There is moderate to severe bilateral PCA irregularity with bilateral P2 segment stenoses. There is bilateral preserved distal PCA flow.  Antegrade flow in both ICA siphons. Mild ICA irregularity and no significant ICA stenosis. Ophthalmic artery origins are within normal limits. Patent carotid termini. MCA and ACA origins are within normal limits.  Anterior communicating artery is within normal limits. There is a moderate to severe stenosis of the left ACA A2 segment with preserved distal flow.  Both MCA M1 segments are patent with only mild irregularity. There is mild stenosis at both MCA bifurcations, but the sylvian branches remain patent. Moderate irregularity in the bilateral MCA branches with moderate to severe bilateral M2 stenoses (series 7, image 62, series 704, image 8).  IMPRESSION: 1. Small acute right inferior cerebellar infarct. No mass effect or hemorrhage.  2. Otherwise mild for age nonspecific cerebral white matter signal changes.  3. MRA reveals moderate to severe intracranial medium vessel atherosclerosis, with moderate to severe bilateral P2, bilateral M2, and left A2 stenoses. There is preserved distal flow in these branches.  4. There is also a moderate distal basilar artery stenosis, just proximal to the SCA and PCA origins, and the posterior communicating arteries are diminutive or absent.   Electronically Signed   By: Augusto Gamble M.D.   On: 03/22/2013 10:28   Dg Chest Port 1 View  04/02/2013   CLINICAL DATA:  Hypertension, elevated white blood cell count  EXAM: PORTABLE CHEST - 1 VIEW  COMPARISON:  March 20 2013  FINDINGS: The heart size and mediastinal contours are within normal limits. There is no focal infiltrate, pulmonary edema, or pleural effusion. The lung volumes are low. The visualized skeletal structures are stable.   IMPRESSION: No active cardiopulmonary disease.   Electronically Signed   By: Sherian Rein M.D.   On: 04/02/2013 14:05    Dg Abd 2 Views  04/02/2013   CLINICAL DATA:  Rectal bleed, elevated white blood cell count.  EXAM: ABDOMEN - 2 VIEW  COMPARISON:  None.  FINDINGS: The bowel gas pattern is normal. There is no evidence of free air. Extensive bowel content is identified in the colon. A radiopaque needle is projected in the lower pelvis.  IMPRESSION: A radiopaque opaque needle is projected in the lower pelvis. Constipation.   Electronically Signed   By: Sherian Rein M.D.   On: 04/02/2013 13:21    Microbiology: Recent Results (from the past 240 hour(s))  CULTURE, BLOOD (ROUTINE X 2)  Status: None   Collection Time    04/02/13  2:40 PM      Result Value Range Status   Specimen Description BLOOD LEFT HAND   Final   Special Requests BOTTLES DRAWN AEROBIC ONLY 8CCS   Final   Culture  Setup Time     Final   Value: 04/02/2013 21:17     Performed at [facility]   Culture     Final   Value:        BLOOD CULTURE RECEIVED NO GROWTH TO DATE CULTURE WILL BE HELD FOR 5 DAYS BEFORE ISSUING A FINAL NEGATIVE REPORT     Performed at [facility]   Report Status PENDING   Incomplete  CULTURE, BLOOD (ROUTINE X 2)     Status: None   Collection Time    04/02/13  6:15 PM      Result Value Range Status   Specimen Description BLOOD LEFT HAND   Final   Special Requests BOTTLES DRAWN AEROBIC ONLY 10CC   Final   Culture  Setup Time     Final   Value: 04/03/2013 02:11     Performed at [facility]   Culture     Final   Value:        BLOOD CULTURE RECEIVED NO GROWTH TO DATE CULTURE WILL BE HELD FOR 5 DAYS BEFORE ISSUING A FINAL NEGATIVE REPORT     Performed at [facility]   Report Status PENDING   Incomplete  MRSA PCR SCREENING     Status: None   Collection Time    04/02/13  6:33 PM      Result Value Range Status   MRSA by PCR NEGATIVE  NEGATIVE Final   Comment:             The GeneXpert MRSA Assay (FDA     approved for NASAL specimens     only), is one component of a     comprehensive MRSA colonization     surveillance program. It is not     intended to diagnose MRSA     infection nor to guide or     monitor treatment for     MRSA infections.  URINE CULTURE     Status: None   Collection Time    04/02/13  6:40 PM      Result Value Range Status   Specimen Description URINE, CATHETERIZED   Final   Special Requests NONE   Final   Culture  Setup Time     Final   Value: 04/02/2013 22:24     Performed at [facility]   Colony Count     Final   Value: NO GROWTH     Performed at [facility]   Culture     Final   Value: NO GROWTH     Performed at [facility]   Report Status 04/04/2013 FINAL   Final     Labs: Basic Metabolic Panel:  Recent Labs Lab 04/02/13 1031 04/03/13 0205 04/04/13 0226  NA 135 136 132*  K 4.2 4.0 3.5  CL 101 102 99  CO2 21 23 24   GLUCOSE 166* 138* 213*  BUN 20 27* 21  CREATININE 0.76 0.70 0.56  CALCIUM 7.6* 8.2* 7.9*   CBC:  Recent Labs Lab 04/02/13 1031  04/03/13 1045 04/03/13 1840 04/04/13 0226 04/04/13 1045 04/05/13 1045  WBC 24.8*  < > 20.4* 17.9* 14.7* 14.9* 11.9*  NEUTROABS 20.8*  --   --   --   --   --   --  HGB 6.2*  < > 9.6* 8.8* 8.6* 8.5* 8.8*  HCT 18.3*  < > 26.5* 25.2* 25.0* 25.1* 25.4*  MCV 80.3  < > 81.5 83.4 83.6 84.2 84.7  PLT 767*  < > 595* 558* 562* 551* 532*  < > = values in this interval not displayed. Cardiac Enzymes:  Recent Labs Lab 04/02/13 1440 04/02/13 1815 04/03/13 0205  TROPONINI <0.30 <0.30 <0.30     Signed:  Conley Canal [phone]  [facility] 04/05/2013, 11:27 AM  Addendum I evaluated patient personally on 04/05/13 and agree with the above findings. She stated doing well, denied further episodes of bright red blood per rectum. Labs were followed, as Hg has remained stable. AM labs showed a Hg of 8.8, slightly  improved from 8.5 on 04/04/13. Given clinical stability, she will be discharged today to her SNF. Repeat H&H on 04/06/13.

## 2013-04-05 NOTE — Evaluation (Signed)
Physical Therapy Evaluation Patient Details Name: Cristina Patton MRN: [id number] DOB: 1927-02-10 Today's Date: 04/05/2013 Time: 4098-1191 PT Time Calculation (min): 20 min  PT Assessment / Plan / Recommendation History of Present Illness   Pt is a 79 y.o. Female adm from [facility] secondary to bleeding from rectum. Pt with HgB 6.2, platelets 767 and WBC 24.8 in ED. Pt treated for GI bleed with acute blood loss anemia, UTi and wound care was consulted regarding back ulcers. PMH includes recent CVA and HTN.   Clinical Impression  Pt adm due to above. PT evaluation limited due to pt c/o fatigue. Pt agreeable to sit EOB to assess strength and balance. Pt refusing OOB activities. Pt presents with decreased strength, activity tolerance and balance which limits pt functional mobility. Pt to benefit from skilled PT to address deficits listed below. Will recommend continued PT upon D/C to [facility].     PT Assessment  Patient needs continued PT services    Follow Up Recommendations  SNF    Does the patient have the potential to tolerate intense rehabilitation      Barriers to Discharge   pt lives at [facility]    Equipment Recommendations  Other (comment) (TBD at SNF)    Recommendations for Other Services     Frequency Min 2X/week    Precautions / Restrictions Precautions Precautions: Fall Restrictions Weight Bearing Restrictions: No   Pertinent Vitals/Pain No c/o pain.       Mobility  Bed Mobility Bed Mobility: Supine to Sit;Sitting - Scoot to [place] of Bed;Sit to Supine Supine to Sit: 4: Min assist;With rails;HOB elevated Sitting - Scoot to [place] of Bed: 4: Min assist Sit to Supine: 4: Min assist;HOB flat Details for Bed Mobility Assistance: (A) to advance LEs off/onto bed; pt requires increased time and demo decreased initiation for tasks; requires multimodal cues for sequencing Transfers Transfers: Not assessed (pt refusing today ) Ambulation/Gait Ambulation/Gait  Assistance: Other (comment) (pt refusing OOB activties ) Stairs: No Wheelchair Mobility Wheelchair Mobility: No    Exercises General Exercises - Lower Extremity Ankle Circles/Pumps: AROM;Both;10 reps;Seated   PT Diagnosis: Difficulty walking;Generalized weakness  PT Problem List: Decreased strength;Decreased activity tolerance;Decreased balance;Decreased mobility;Decreased cognition;Decreased safety awareness;Decreased knowledge of use of DME PT Treatment Interventions: DME instruction;Functional mobility training;Therapeutic activities;Therapeutic exercise;Balance training;Neuromuscular re-education;Patient/family education     PT Goals(Current goals can be found in the care plan section) Acute Rehab PT Goals Patient Stated Goal: Did not state.  Agreeable to work toward incr mobility. PT Goal Formulation: With patient Time For Goal Achievement: 04/19/13 Potential to Achieve Goals: Fair  Visit Information  Last PT Received On: 04/05/13 Assistance Needed: +2 (for transfers)       Prior Functioning  Home Living Family/patient expects to be discharged to:: Skilled nursing facility Living Arrangements: Alone Additional Comments: Pt resides at [facility] Prior Function Level of Independence: Needs assistance Comments: Pt is poor historian due to decr cognition  Communication Communication: No difficulties Dominant Hand: Right    Cognition  Cognition Arousal/Alertness: Awake/alert Behavior During Therapy: WFL for tasks assessed/performed Overall Cognitive Status: Impaired/Different from baseline Area of Impairment: Orientation;Attention;Following commands;Safety/judgement;Awareness;Problem solving Orientation Level: Disoriented to;Situation;Time Current Attention Level: Selective Memory: Decreased short-term memory Following Commands: Follows one step commands inconsistently;Follows one step commands with increased time Safety/Judgement: Decreased awareness of  deficits;Decreased awareness of safety Awareness: Intellectual Problem Solving: Slow processing;Decreased initiation;Difficulty sequencing;Requires verbal cues;Requires tactile cues General Comments: pt requires redirection to focus on task; pt perserverating on the fact  that she believed there was ice outside (there was not); pt repeated information and was unaware     Extremity/Trunk Assessment Upper Extremity Assessment Upper Extremity Assessment: Defer to OT evaluation Lower Extremity Assessment Lower Extremity Assessment: Generalized weakness Cervical / Trunk Assessment Cervical / Trunk Assessment: Kyphotic   Balance Balance Balance Assessed: Yes Static Sitting Balance Static Sitting - Balance Support: Bilateral upper extremity supported;Feet unsupported Static Sitting - Level of Assistance: 5: Stand by assistance Static Sitting - Comment/# of Minutes: pt tolerated sitting ~9 min; would lean posteriorly; correctable with verbal cues   End of Session PT - End of Session Activity Tolerance: Patient limited by fatigue Patient left: in bed;with bed alarm set;with call bell/phone within reach Nurse Communication: Mobility status  GP     Donell Sievert , [place] [phone]  04/05/2013, 11:54 AM

## 2013-04-05 NOTE — Clinical Social Work Note (Signed)
Per MD patient ready to return to [facility] 04/05/13. Patient, RN, and facility notified of DC. RN given number for report. Ambulance transport requested for patient. DC packet left with patient's chart. CSW signing off.  Roddie Mc, [place], [place], [phone]

## 2013-04-06 ENCOUNTER — Non-Acute Institutional Stay (SKILLED_NURSING_FACILITY): Payer: Medicare Other | Admitting: Internal Medicine

## 2013-04-06 DIAGNOSIS — L8995 Pressure ulcer of unspecified site, unstageable: Secondary | ICD-10-CM

## 2013-04-06 DIAGNOSIS — L8990 Pressure ulcer of unspecified site, unspecified stage: Secondary | ICD-10-CM

## 2013-04-08 LAB — CULTURE, BLOOD (ROUTINE X 2): Culture: NO GROWTH

## 2013-04-09 LAB — CULTURE, BLOOD (ROUTINE X 2): Culture: NO GROWTH

## 2013-04-10 NOTE — Progress Notes (Signed)
Patient ID: Cristina Patton, female   DOB: 1927-02-11, 79 y.o.   MRN: [id number] Facility: [facility] Chief Complaint; Wound Review History: this is a patient who comes back to the facility with a small wound just to the rt of her upper thoracic spine.   Physical Exam: Skin: there is a small wound about the size of a dime; This is to the right of roughly her T5 level. It is covered by a tight adherent escar. There is no evidence of surrounding infection  Impression Wound back: This is an unusual place for a pressure ulcer however I believe this is exactly what this is. We will use Santyl to loosen the escar covered by a foam dressing changed Every 2nd day. Further mechanical debridement may be necessary.

## 2013-04-12 ENCOUNTER — Non-Acute Institutional Stay (SKILLED_NURSING_FACILITY): Payer: Medicare Other | Admitting: Internal Medicine

## 2013-04-12 DIAGNOSIS — D62 Acute posthemorrhagic anemia: Secondary | ICD-10-CM

## 2013-04-12 DIAGNOSIS — I10 Essential (primary) hypertension: Secondary | ICD-10-CM

## 2013-04-12 DIAGNOSIS — I639 Cerebral infarction, unspecified: Secondary | ICD-10-CM

## 2013-04-12 DIAGNOSIS — I6350 Cerebral infarction due to unspecified occlusion or stenosis of unspecified cerebral artery: Secondary | ICD-10-CM

## 2013-04-12 DIAGNOSIS — K5900 Constipation, unspecified: Secondary | ICD-10-CM

## 2013-05-10 ENCOUNTER — Encounter: Payer: Self-pay | Admitting: Internal Medicine

## 2013-05-10 DIAGNOSIS — R339 Retention of urine, unspecified: Secondary | ICD-10-CM | POA: Insufficient documentation

## 2013-05-10 NOTE — Progress Notes (Signed)
Patient ID: ROSETTA RUPNOW, female   DOB: 03/28/1927, 80 y.o.   MRN: [id number]        HISTORY & PHYSICAL  DATE: 03/27/2013     FACILITY: [facility]  LEVEL OF CARE: SNF (31)  ALLERGIES:  No Known Allergies  CHIEF COMPLAINT:  Manage hypertension, severe protein calorie malnutrition, and urinary retention.    HISTORY OF PRESENT ILLNESS:  The patient is an 80 year-old, African-American female who was hospitalized after a fall.  After hospitalization, she is admitted to this facility for short-term rehabilitation.  She has the following problems:    HTN: Pt 's HTN remains stable.  Denies CP, sob, DOE, pedal edema, headaches, dizziness or visual disturbances.  No complications from the medications currently being used.  Last BP :   150/80.    SEVERE PROTEIN CALORIE MALNUTRITION:  Patient is currently on Glucerna between meals.  Albumin level is 2.7.    URINARY RETENTION:  In the hospital, patient developed urinary retention.  Therefore, a Foley catheter was placed and she was started on Flomax.  She is tolerating the Flomax  without any side effects.    PAST MEDICAL HISTORY :  Past Medical History  Diagnosis Date  . Hypertension   . CVA (cerebral infarction)    PAST SURGICAL HISTORY: None  SOCIAL HISTORY:  reports that she has never smoked. She does not have any smokeless tobacco history on file. She reports that she does not drink alcohol or use illicit drugs.  FAMILY HISTORY: None  CURRENT MEDICATIONS: Reviewed per MAR  REVIEW OF SYSTEMS:  See HPI otherwise 14 point ROS is negative.  PHYSICAL EXAMINATION  VS:  T 98.1       P 80      RR 18      BP 150/80      POX 96%        WT (Lb) 129.5     GENERAL: no acute distress, normal body habitus EYES: conjunctivae normal, sclerae normal, normal eye lids MOUTH/THROAT: lips without lesions,no lesions in the mouth,tongue is without lesions,uvula elevates in midline NECK: supple, trachea midline, no neck masses,  no thyroid tenderness, no thyromegaly LYMPHATICS: no LAN in the neck, no supraclavicular LAN RESPIRATORY: breathing is even & unlabored, BS CTAB CARDIAC: RRR, no murmur,no extra heart sounds, no edema GI:  ABDOMEN: abdomen soft, normal BS, no masses, no tenderness  LIVER/SPLEEN: no hepatomegaly, no splenomegaly MUSCULOSKELETAL: HEAD: normal to inspection & palpation BACK: no kyphosis, scoliosis or spinal processes tenderness EXTREMITIES: LEFT UPPER EXTREMITY: full range of motion, normal strength & tone RIGHT UPPER EXTREMITY:  full range of motion, normal strength & tone LEFT LOWER EXTREMITY:  full range of motion, normal strength & tone RIGHT LOWER EXTREMITY:  full range of motion, normal strength & tone PSYCHIATRIC: the patient is alert & oriented to person, affect & behavior appropriate  LABS/RADIOLOGY: TSH 0.426.    Hemoglobin A1c 5.7.    CT of the head:  No acute findings.    CT of the cervical spine:  No fracture.    MRA of the head:  Showed small acute right inferior cerebellar infarct.    Chest x-ray:  No acute findings.      WBC 12.5, hemoglobin 10.9, platelets 433.    Chloride 115.    Fasting lipid panel normal.    Labs reviewed: Basic Metabolic Panel:  Recent Labs  03/21/13 0239 03/21/13 0430  04/02/13 1031 04/03/13 0205 04/04/13 0226  NA 147*  --   < >  135 136 132*  K 2.7*  --   < > 4.2 4.0 3.5  CL 110  --   < > 101 102 99  CO2 21  --   < > 21 23 24   GLUCOSE 228*  --   < > 166* 138* 213*  BUN 54*  --   < > 20 27* 21  CREATININE 1.43*  --   < > 0.76 0.70 0.56  CALCIUM 9.0  --   < > 7.6* 8.2* 7.9*  MG  --  2.5  --   --   --   --   < > = values in this interval not displayed. Liver Function Tests:  Recent Labs  03/20/13 1400 03/21/13 0239  AST 76* 52*  ALT 43* 33  ALKPHOS 58 44  BILITOT 0.9 0.8  PROT 8.7* 7.1  ALBUMIN 3.4* 2.7*   CBC:  Recent Labs  03/20/13 1359  04/02/13 1031  04/04/13 0226 04/04/13 1045 04/05/13 1045  WBC 18.7*   < > 24.8*  < > 14.7* 14.9* 11.9*  NEUTROABS 15.5*  --  20.8*  --   --   --   --   HGB 15.7*  < > 6.2*  < > 8.6* 8.5* 8.8*  HCT 43.7  < > 18.3*  < > 25.0* 25.1* 25.4*  MCV 79.9  < > 80.3  < > 83.6 84.2 84.7  PLT PLATELET CLUMPS NOTED ON SMEAR, COUNT APPEARS ADEQUATE  < > 767*  < > 562* 551* 532*  < > = values in this interval not displayed.  Lipid Panel:  Recent Labs  03/22/13 1200  HDL 61   Cardiac Enzymes:  Recent Labs  03/20/13 1359  03/21/13 0239 04/02/13 1440 04/02/13 1815 04/03/13 0205  CKTOTAL 2267*  --  1252*  --   --   --   TROPONINI <0.30  < > <0.30 <0.30 <0.30 <0.30  < > = values in this interval not displayed.  CBG:  Recent Labs  03/22/13 2106 03/23/13 0605 03/23/13 1126  GLUCAP 115* 105* 138*   ASSESSMENT/PLAN:    Hypertension.  Blood pressure elevated.  We will review a log.    Severe protein calorie malnutrition.  Continue Glucerna.    Urinary retention.  Continue Flomax.    Small right cerebellar ischemic CVA.  Continue Plavix and rehabilitation.    Leukocytosis.  Patient is asymptomatic.  We will reassess.    Anemia of chronic disease.  Reassess.    Thrombocytosis.  Likely acute phase reactant.  We will monitor.    Check CBC and BMP.    THN Metrics:   Nonsmoker.  Not on aspirin.  Blood pressure:  150/80.    I have reviewed patient's medical records received at admission/from hospitalization.  CPT CODE: 39767

## 2013-06-18 ENCOUNTER — Encounter: Payer: Self-pay | Admitting: Internal Medicine

## 2013-06-18 DIAGNOSIS — K5900 Constipation, unspecified: Secondary | ICD-10-CM | POA: Insufficient documentation

## 2013-06-18 NOTE — Progress Notes (Signed)
Patient ID: RENNA KILMER, female   DOB: 1927-04-04, 80 y.o.   MRN: [id number]               HISTORY & PHYSICAL  DATE: 04/12/2013     FACILITY: [facility]  LEVEL OF CARE: SNF (31)  ALLERGIES:  No Known Allergies  CHIEF COMPLAINT:  Manage acute blood loss anemia, CVA, and hypertension.    HISTORY OF PRESENT ILLNESS:  The patient is an 80 year-old, African-American female who was hospitalized secondary to hemorrhoidal bleeding.  After hospitalization, she is admitted to this facility for short-term rehabilitation.    ANEMIA:  Postoperatively, patient suffered acute blood loss.   She was transfused 2 U of packed red blood cells.  She was not a candidate for endoscopy.  Hemoglobin improved to 8.6.  The anemia has been stable. The patient denies fatigue, melena or hematochezia.  The patient is currently not on iron.  CVA: The patient's CVA remains stable.  Patient denies new neurologic symptoms such as numbness, tingling, weakness, speech difficulties or visual disturbances.  No complications reported from the medications currently being used.      HTN: Pt 's HTN remains stable.  Denies CP, sob, DOE, pedal edema, headaches, dizziness or visual disturbances.  No complications from the medications currently being used.  Last BP :   120/80.    PAST MEDICAL HISTORY :  Past Medical History  Diagnosis Date  . Hypertension   . CVA (cerebral infarction)     PAST SURGICAL HISTORY: None  SOCIAL HISTORY:  reports that she has never smoked. She does not have any smokeless tobacco history on file. She reports that she does not drink alcohol or use illicit drugs.  FAMILY HISTORY: None  CURRENT MEDICATIONS: Reviewed per MAR  REVIEW OF SYSTEMS:  See HPI otherwise 14 point ROS is negative.  PHYSICAL EXAMINATION  VS:  T 98.6       P 80      RR 18      BP 120/80       WT (Lb) 123    GENERAL: no acute distress, normal body habitus EYES: conjunctivae normal, sclerae normal,  normal eye lids MOUTH/THROAT: lips without lesions,no lesions in the mouth,tongue is without lesions,uvula elevates in midline NECK: supple, trachea midline, no neck masses, no thyroid tenderness, no thyromegaly LYMPHATICS: no LAN in the neck, no supraclavicular LAN RESPIRATORY: breathing is even & unlabored, BS CTAB CARDIAC: RRR, no murmur,no extra heart sounds, no edema GI:  ABDOMEN: abdomen soft, normal BS, no masses, no tenderness  LIVER/SPLEEN: no hepatomegaly, no splenomegaly MUSCULOSKELETAL: HEAD: normal to inspection & palpation BACK: no kyphosis, scoliosis or spinal processes tenderness EXTREMITIES: LEFT UPPER EXTREMITY: full range of motion, normal strength & tone RIGHT UPPER EXTREMITY:  full range of motion, normal strength & tone LEFT LOWER EXTREMITY:  full range of motion, normal strength & tone RIGHT LOWER EXTREMITY:  full range of motion, normal strength & tone PSYCHIATRIC: the patient is alert & oriented to person, affect & behavior appropriate  LABS/RADIOLOGY: MRI of the head:  Showed small acute right inferior cerebellar infarct.  No mass effect or hemorrhage.    MRA:  Showed moderate to severe intracranial medial vessel sclerosis with moderate to severe bilateral P2, bilateral N2, and left stenosis.  Moderate distal basilar artery stenosis.    Chest x-ray:  No acute disease.    Abdominal x-ray:  Showed constipation.    Blood culture x2 showed no growth.  MRSA by PCR negative.     Urine culture showed no growth.    Labs reviewed: Basic Metabolic Panel:  Recent Labs  03/21/13 0239 03/21/13 0430  04/02/13 1031 04/03/13 0205 04/04/13 0226  NA 147*  --   < > 135 136 132*  K 2.7*  --   < > 4.2 4.0 3.5  CL 110  --   < > 101 102 99  CO2 21  --   < > 21 23 24   GLUCOSE 228*  --   < > 166* 138* 213*  BUN 54*  --   < > 20 27* 21  CREATININE 1.43*  --   < > 0.76 0.70 0.56  CALCIUM 9.0  --   < > 7.6* 8.2* 7.9*  MG  --  2.5  --   --   --   --   < > = values  in this interval not displayed. Liver Function Tests:  Recent Labs  03/20/13 1400 03/21/13 0239  AST 76* 52*  ALT 43* 33  ALKPHOS 58 44  BILITOT 0.9 0.8  PROT 8.7* 7.1  ALBUMIN 3.4* 2.7*   CBC:  Recent Labs  03/20/13 1359  04/02/13 1031  04/04/13 0226 04/04/13 1045 04/05/13 1045  WBC 18.7*  < > 24.8*  < > 14.7* 14.9* 11.9*  NEUTROABS 15.5*  --  20.8*  --   --   --   --   HGB 15.7*  < > 6.2*  < > 8.6* 8.5* 8.8*  HCT 43.7  < > 18.3*  < > 25.0* 25.1* 25.4*  MCV 79.9  < > 80.3  < > 83.6 84.2 84.7  PLT PLATELET CLUMPS NOTED ON SMEAR, COUNT APPEARS ADEQUATE  < > 767*  < > 562* 551* 532*  < > = values in this interval not displayed.  Lipid Panel:  Recent Labs  03/22/13 1200  HDL 61   Cardiac Enzymes:  Recent Labs  03/20/13 1359  03/21/13 0239 04/02/13 1440 04/02/13 1815 04/03/13 0205  CKTOTAL 2267*  --  1252*  --   --   --   TROPONINI <0.30  < > <0.30 <0.30 <0.30 <0.30  < > = values in this interval not displayed.  CBG:  Recent Labs  03/22/13 2106 03/23/13 0605 03/23/13 1126  GLUCAP 115* 105* 138*    ASSESSMENT/PLAN:  Acute blood loss anemia.  Status post transfusion.  Hemoglobin improved.     CVA.  Continue Plavix.    Hypertension.  Well controlled.    Constipation.  Continue current laxatives.    Urinary retention.  Currently on Flomax.    The family has requested patient to be discharged on 04/17/2013.  Per Physical Therapy, the patient is not participating.  She is maximal assist and needs 24-hour care which the family is willing to provide.    Therefore, we will discharge patient home with home health PT, OT, social worker, skilled nurse, and CNA.     DME provided:  Wheelchair for mobility.  (438.9, 728.87)    I have filled out patient's discharge paperwork and written prescriptions.    Discharge time involved coordination of the discharge process with social worker, nursing staff and therapy department.   THN Metrics:   Nonsmoker.  Not  on aspirin.    I have reviewed patient's medical records received at admission/from hospitalization.  CPT CODE: 32440    [address] Talha Iser, [place] [phone]

## 2013-09-01 ENCOUNTER — Encounter (HOSPITAL_COMMUNITY): Payer: Self-pay | Admitting: Emergency Medicine

## 2013-09-01 ENCOUNTER — Emergency Department (HOSPITAL_COMMUNITY)
Admission: EM | Admit: 2013-09-01 | Discharge: 2013-09-02 | Disposition: A | Discharge status: Home / Self Care | Payer: Medicare Other | Attending: Emergency Medicine | Admitting: Emergency Medicine

## 2013-09-01 DIAGNOSIS — N390 Urinary tract infection, site not specified: Secondary | ICD-10-CM | POA: Insufficient documentation

## 2013-09-01 DIAGNOSIS — K5900 Constipation, unspecified: Secondary | ICD-10-CM | POA: Insufficient documentation

## 2013-09-01 DIAGNOSIS — Z79899 Other long term (current) drug therapy: Secondary | ICD-10-CM | POA: Insufficient documentation

## 2013-09-01 DIAGNOSIS — K4030 Unilateral inguinal hernia, with obstruction, without gangrene, not specified as recurrent: Secondary | ICD-10-CM | POA: Insufficient documentation

## 2013-09-01 DIAGNOSIS — Z7902 Long term (current) use of antithrombotics/antiplatelets: Secondary | ICD-10-CM | POA: Insufficient documentation

## 2013-09-01 DIAGNOSIS — Z8673 Personal history of transient ischemic attack (TIA), and cerebral infarction without residual deficits: Secondary | ICD-10-CM | POA: Insufficient documentation

## 2013-09-01 DIAGNOSIS — IMO0002 Reserved for concepts with insufficient information to code with codable children: Secondary | ICD-10-CM | POA: Insufficient documentation

## 2013-09-01 DIAGNOSIS — I10 Essential (primary) hypertension: Secondary | ICD-10-CM | POA: Insufficient documentation

## 2013-09-01 LAB — CBC WITH DIFFERENTIAL/PLATELET
BASOS ABS: 0 10*3/uL (ref 0.0–0.1)
Basophils Relative: 0 % (ref 0–1)
Eosinophils Absolute: 0 10*3/uL (ref 0.0–0.7)
Eosinophils Relative: 0 % (ref 0–5)
HEMATOCRIT: 39.5 % (ref 36.0–46.0)
Hemoglobin: 13.5 g/dL (ref 12.0–15.0)
LYMPHS PCT: 28 % (ref 12–46)
Lymphs Abs: 2.5 10*3/uL (ref 0.7–4.0)
MCH: 27.7 pg (ref 26.0–34.0)
MCHC: 34.2 g/dL (ref 30.0–36.0)
MCV: 81.1 fL (ref 78.0–100.0)
Monocytes Absolute: 0.5 10*3/uL (ref 0.1–1.0)
Monocytes Relative: 6 % (ref 3–12)
NEUTROS ABS: 6 10*3/uL (ref 1.7–7.7)
NEUTROS PCT: 66 % (ref 43–77)
Platelets: 942 10*3/uL (ref 150–400)
RBC: 4.87 MIL/uL (ref 3.87–5.11)
RDW: 17.9 % — AB (ref 11.5–15.5)
WBC: 9 10*3/uL (ref 4.0–10.5)

## 2013-09-01 LAB — COMPREHENSIVE METABOLIC PANEL
ALBUMIN: 3.9 g/dL (ref 3.5–5.2)
ALT: 5 U/L (ref 0–35)
AST: 15 U/L (ref 0–37)
Alkaline Phosphatase: 61 U/L (ref 39–117)
BILIRUBIN TOTAL: 0.5 mg/dL (ref 0.3–1.2)
BUN: 14 mg/dL (ref 6–23)
CHLORIDE: 100 meq/L (ref 96–112)
CO2: 24 mEq/L (ref 19–32)
CREATININE: 0.48 mg/dL — AB (ref 0.50–1.10)
Calcium: 9.8 mg/dL (ref 8.4–10.5)
GFR calc Af Amer: >90 mL/min (ref >90)
GFR, EST NON AFRICAN AMERICAN: 87 mL/min — AB (ref >90)
Glucose, Bld: 142 mg/dL — ABNORMAL HIGH (ref 70–99)
Potassium: 3.9 mEq/L (ref 3.7–5.3)
Sodium: 140 mEq/L (ref 137–147)
Total Protein: 8.8 g/dL — ABNORMAL HIGH (ref 6.0–8.3)

## 2013-09-01 LAB — LIPASE, BLOOD: Lipase: 22 U/L (ref 11–59)

## 2013-09-01 MED ORDER — MORPHINE SULFATE 4 MG/ML IJ SOLN
4.0000 mg | Freq: Once | INTRAMUSCULAR | Status: AC
  Administered 2013-09-01: 4 mg via INTRAVENOUS
  Filled 2013-09-01: qty 1

## 2013-09-01 MED ORDER — SODIUM CHLORIDE 0.9 % IV BOLUS (SEPSIS)
1000.0000 mL | Freq: Once | INTRAVENOUS | Status: AC
  Administered 2013-09-01: 1000 mL via INTRAVENOUS

## 2013-09-01 NOTE — ED Notes (Signed)
The pt is c/o a large knot in her rt groin just noticed by her friend who was changing her diaper.  She came in by ambulance w/c she is now c/o and pain

## 2013-09-01 NOTE — ED Provider Notes (Signed)
CSN: [id number]     Arrival date & time 09/01/13  2025 History   First MD Initiated Contact with Patient 09/01/13 2309     Chief Complaint  Patient presents with  . boil groin abd pain      (Consider location/radiation/quality/duration/timing/severity/associated sxs/prior Treatment) HPI Patient is an 80 yo woman who is BIB EMS from home with complaints of right inguinal mass and pain since 1700. Developed as she was straining to have a BM.  Has been constipated since and has vomited twice. NBNB. No fever. Pain localizes to the lower abdomen and his moderately severe. No fever. No history of similar sx. Nothing makes pain worse or better. Non radiating pain. Aching is nature.   Past Medical History  Diagnosis Date  . Hypertension   . CVA (cerebral infarction)    History reviewed. No pertinent past surgical history. No family history on file. History  Substance Use Topics  . Smoking status: Never Smoker   . Smokeless tobacco: Not on file  . Alcohol Use: No   OB History   Grav Para Term Preterm Abortions TAB SAB Ect Mult Living                 Review of Systems    Ten point review of symptoms performed and is negative with the exception of symptoms noted above.  Allergies  Review of patient's allergies indicates no known allergies.  Home Medications   Prior to Admission medications   Medication Sig Start Date End Date Taking? Authorizing Provider  acetaminophen (TYLENOL) 325 MG tablet Take 2 tablets (650 mg total) by mouth every 6 (six) hours as needed for mild pain (or Fever >/= 101). 04/05/13   Bobby Rumpf York, PA-C  amLODipine (NORVASC) 5 MG tablet Take 1 tablet (5 mg total) by mouth daily. 04/05/13   Melton Alar, PA-C  clopidogrel (PLAVIX) 75 MG tablet Take 1 tablet (75 mg total) by mouth daily. 04/06/13   Bobby Rumpf York, PA-C  docusate sodium (COLACE) 100 MG capsule Take 1 capsule (100 mg total) by mouth at bedtime. 04/05/13   Melton Alar, PA-C  hydrocortisone  (ANUSOL-HC) 2.5 % rectal cream Place rectally 3 (three) times daily. 04/05/13   Bobby Rumpf York, PA-C  hydrocortisone-pramoxine [facility]) rectal foam Place 1 applicator rectally 2 (two) times daily. 04/05/13   Bobby Rumpf York, PA-C  lactose free nutrition (BOOST PLUS) LIQD Take 237 mLs by mouth 2 (two) times daily between meals.    Historical Provider, MD  polyethylene glycol (MIRALAX / GLYCOLAX) packet Take 17 g by mouth daily. Mix in 1/2 cup (4 ounces) of liquid 04/05/13   Melton Alar, PA-C  tamsulosin (FLOMAX) 0.4 MG CAPS capsule Take 1 capsule (0.4 mg total) by mouth daily. 03/23/13   Thurnell Lose, MD   BP 213/83  Pulse 110  Temp(Src) 98 F (36.7 C) (Oral)  Resp 12  Ht 5\' 6"  (1.676 m)  Wt 115 lb (52.164 kg)  BMI 18.57 kg/m2  SpO2 100% Physical Exam Gen: well developed and thin appearing Head: NCAT Eyes: PERL, EOMI Nose: no epistaixis or rhinorrhea Mouth/throat: mucosa is moist and pink Neck: supple, no stridor Lungs: CTA B, no wheezing, rhonchi or rales CV: RRR, no murmur, extremities appear well perfused.  Abd: soft, notender, nondistended, large tender fluctuant mass in the right inguinal region, tender to palpation.  Back: no ttp, no cva ttp Skin: warm and dry Ext: normal to inspection, no dependent edema Neuro: CN ii-xii grossly  intact, no focal deficits Psyche; normal affect,  calm and cooperative.   ED Course  Procedures (including critical care time) Labs Review  Results for orders placed during the hospital encounter of 09/01/13 (from the past 24 hour(s))  CBC WITH DIFFERENTIAL     Status: Abnormal   Collection Time    09/01/13  8:37 PM      Result Value Ref Range   WBC 9.0  4.0 - 10.5 K/uL   RBC 4.87  3.87 - 5.11 MIL/uL   Hemoglobin 13.5  12.0 - 15.0 g/dL   HCT 39.5  36.0 - 46.0 %   MCV 81.1  78.0 - 100.0 fL   MCH 27.7  26.0 - 34.0 pg   MCHC 34.2  30.0 - 36.0 g/dL   RDW 17.9 (*) 11.5 - 15.5 %   Platelets 942 (*) 150 - 400 K/uL   Neutrophils  Relative % 66  43 - 77 %   Neutro Abs 6.0  1.7 - 7.7 K/uL   Lymphocytes Relative 28  12 - 46 %   Lymphs Abs 2.5  0.7 - 4.0 K/uL   Monocytes Relative 6  3 - 12 %   Monocytes Absolute 0.5  0.1 - 1.0 K/uL   Eosinophils Relative 0  0 - 5 %   Eosinophils Absolute 0.0  0.0 - 0.7 K/uL   Basophils Relative 0  0 - 1 %   Basophils Absolute 0.0  0.0 - 0.1 K/uL  COMPREHENSIVE METABOLIC PANEL     Status: Abnormal   Collection Time    09/01/13  8:37 PM      Result Value Ref Range   Sodium 140  137 - 147 mEq/L   Potassium 3.9  3.7 - 5.3 mEq/L   Chloride 100  96 - 112 mEq/L   CO2 24  19 - 32 mEq/L   Glucose, Bld 142 (*) 70 - 99 mg/dL   BUN 14  6 - 23 mg/dL   Creatinine, Ser 0.48 (*) 0.50 - 1.10 mg/dL   Calcium 9.8  8.4 - 10.5 mg/dL   Total Protein 8.8 (*) 6.0 - 8.3 g/dL   Albumin 3.9  3.5 - 5.2 g/dL   AST 15  0 - 37 U/L   ALT 5  0 - 35 U/L   Alkaline Phosphatase 61  39 - 117 U/L   Total Bilirubin 0.5  0.3 - 1.2 mg/dL   GFR calc non Af Amer 87 (*) >90 mL/min   GFR calc Af Amer >90  >90 mL/min  LIPASE, BLOOD     Status: None   Collection Time    09/01/13  8:37 PM      Result Value Ref Range   Lipase 22  11 - 59 U/L   CT Abdomen Pelvis W Contrast (Final result)  Result time: 09/02/13 01:37:07    Final result by Rad Results In Interface (09/02/13 01:37:07)    Narrative:   CLINICAL DATA: Abdominal pain and vomiting. Knot noted in the right groin.  EXAM: CT ABDOMEN AND PELVIS WITH CONTRAST  TECHNIQUE: Multidetector CT imaging of the abdomen and pelvis was performed using the standard protocol following bolus administration of intravenous contrast.  CONTRAST: 156mL OMNIPAQUE IOHEXOL 300 MG/ML SOLN  COMPARISON: DG ABD 2 VIEWS dated 04/02/2013  FINDINGS: Fibrosis and atelectasis in the lung bases. Coronary artery and mitral valve calcifications. Moderately prominent axillary lymph nodes.  Multiple cysts in the liver. No solid mass lesions appreciated. Spleen is normal. Gallbladder  and bile  ducts are normal. There is mild pancreatic ductal dilatation. No specific mass is demonstrated but a occult stone or intraductal lesion is not excluded. Consider MRCP for further evaluation if clinically indicated. No adrenal gland nodules. Bilateral renal cysts. Mild right renal atrophy. Multiple nonobstructing intrarenal stones in the right kidney. No hydronephrosis. Calcification of the abdominal aorta without aneurysm. Wall thickening in the aorta consistent with atherosclerotic changes. Inferior vena cava and retroperitoneal lymph nodes are normal. The stomach, small bowel, and colon are not abnormally distended. The colon is diffusely stool-filled suggesting possible constipation. No free air or free fluid in the abdomen.  Pelvis: There is a right inguinal hernia containing fluid. No bowel content. Bladder wall is thickened, suggesting possible cystitis or hypertrophy. Uterus is either atrophic or surgically absent. No pelvic mass or lymphadenopathy. No evidence of diverticulitis. Appendix is not identified. Degenerative changes in the lumbar spine. Spondylolysis with mild spondylolisthesis at L4-5. Mild endplate depression at L3 and L4. Changes likely represent osteoporosis. Degenerative changes in the hips.  IMPRESSION: Right inguinal hernia containing fluid. No evidence of bowel obstruction. Stool-filled colon likely to represent constipation. Pancreatic ductal dilatation. No cause identified. Occult intraductal mass or stone not excluded. Consider MRCP further evaluation if clinically indicated multiple liver cysts. Bladder wall thickening may indicate cystitis or hypertrophy.   Electronically Signed By: Lucienne Capers M.D. On: 09/02/2013 01:37      PROCEDURE:  Right inguinal hernia manually reduced. Patient tolerated procedure well.    MDM   DDX: abscess, incarcerated inguinal hernia, bowel obstruction.   We will treat with analgesic and attempt to  reduce hernia in the ED. Lactic acid level pending. CBC and CMP are reassuring. We are treating with IVF.   CT scan shows right inguinal hernia containing fluid but, no bowel, no signs of obstruction. Patient remains tender over the right inguinal region. WBC, lactic acid wnl. CT scan suspicious for cystitis and U/A supports early UTI. This may be the cause of the patient's persistent but mild tachycardia. We will tx empirically with Keflex. Urine for culture.   Dr. Rosendo Gros evaluated the patient and recommended admission for surgical repair of right inguinal hernia.  0658: patient and nephew have consulted and the patient elects not to undergo surgical repair of hernia. She and caregiver are counseled to please return promptly to the ED for recurrent incarceration, abd pain, persistent vomiting, fever or any other urgent health concerns. Patient will f/u with her PCP.   Elyn Peers, MD 09/02/13 0700

## 2013-09-01 NOTE — ED Notes (Signed)
The pt vomited in triage

## 2013-09-02 ENCOUNTER — Emergency Department (HOSPITAL_COMMUNITY): Payer: Medicare Other

## 2013-09-02 DIAGNOSIS — K4090 Unilateral inguinal hernia, without obstruction or gangrene, not specified as recurrent: Secondary | ICD-10-CM

## 2013-09-02 LAB — URINALYSIS, ROUTINE W REFLEX MICROSCOPIC
Bilirubin Urine: NEGATIVE
GLUCOSE, UA: NEGATIVE mg/dL
Hgb urine dipstick: NEGATIVE
Ketones, ur: NEGATIVE mg/dL
Nitrite: NEGATIVE
PH: 7 (ref 5.0–8.0)
Protein, ur: NEGATIVE mg/dL
Specific Gravity, Urine: 1.021 (ref 1.005–1.030)
Urobilinogen, UA: 0.2 mg/dL (ref 0.0–1.0)

## 2013-09-02 LAB — URINE MICROSCOPIC-ADD ON

## 2013-09-02 LAB — LACTIC ACID, PLASMA: Lactic Acid, Venous: 1.3 mmol/L (ref 0.5–2.2)

## 2013-09-02 MED ORDER — IOHEXOL 300 MG/ML  SOLN
100.0000 mL | Freq: Once | INTRAMUSCULAR | Status: AC | PRN
  Administered 2013-09-02: 100 mL via INTRAVENOUS

## 2013-09-02 MED ORDER — IOHEXOL 300 MG/ML  SOLN
20.0000 mL | INTRAMUSCULAR | Status: AC
  Administered 2013-09-02: 25 mL via ORAL

## 2013-09-02 MED ORDER — CEPHALEXIN 500 MG PO CAPS: 500.0000 mg | ORAL_CAPSULE | Freq: Three times a day (TID) | ORAL | Status: DC

## 2013-09-02 MED ORDER — CEPHALEXIN 500 MG PO CAPS: 500.0000 mg | ORAL_CAPSULE | Freq: Four times a day (QID) | ORAL | Status: DC

## 2013-09-02 NOTE — ED Notes (Signed)
Patient returned from CT

## 2013-09-02 NOTE — ED Notes (Signed)
Family came to state that patient is not interested in surgery at this time.  Dr Cheri Guppy aware.  Patient to be discharged.

## 2013-09-02 NOTE — ED Notes (Signed)
[facility] called for transport home Sitter will meet [facility]  At PT house.

## 2013-09-02 NOTE — ED Notes (Signed)
Patient is going to CT at this time.

## 2013-09-02 NOTE — Consult Note (Signed)
Reason for Consult:RIH  Referring Physician: Dr. Charna Elizabeth is an 80 y.o. female.  HPI: Cristina Patton is a 80 y/o F With h/o CVA, HTN and on plavix who presented with in an incarcerated IH.  This was reduced by EDP.  Cristina Patton states Cristina Patton was having pain since yesterday,a nd didn't know until then that Cristina Patton had a RIH.  Cristina Patton had some abd pain and a R inguinal mass.  He care-taker decided to bring her in for eval.  After reduction of incarcerated RIH, CT was ordered which revealed some fluid within [facility] canal.  Past Medical History  Diagnosis Date  . Hypertension   . CVA (cerebral infarction)     History reviewed. No pertinent past surgical history.  No family history on file.  Social History:  reports that Cristina Patton has never smoked. Cristina Patton does not have any smokeless tobacco history on file. Cristina Patton reports that Cristina Patton does not drink alcohol or use illicit drugs.  Allergies: No Known Allergies  Medications: I have reviewed the patient's current medications.  Results for orders placed during the hospital encounter of 09/01/13 (from the past 48 hour(s))  CBC WITH DIFFERENTIAL     Status: Abnormal   Collection Time    09/01/13  8:37 PM      Result Value Ref Range   WBC 9.0  4.0 - 10.5 K/uL   RBC 4.87  3.87 - 5.11 MIL/uL   Hemoglobin 13.5  12.0 - 15.0 g/dL   HCT 39.5  36.0 - 46.0 %   MCV 81.1  78.0 - 100.0 fL   MCH 27.7  26.0 - 34.0 pg   MCHC 34.2  30.0 - 36.0 g/dL   RDW 17.9 (*) 11.5 - 15.5 %   Platelets 942 (*) 150 - 400 K/uL   Comment: REPEATED TO VERIFY     PLATELET COUNT CONFIRMED BY SMEAR     CRITICAL RESULT CALLED TO, READ BACK BY AND VERIFIED WITHNoel Gerold RN [phone] [id number] GREEN R   Neutrophils Relative % 66  43 - 77 %   Neutro Abs 6.0  1.7 - 7.7 K/uL   Lymphocytes Relative 28  12 - 46 %   Lymphs Abs 2.5  0.7 - 4.0 K/uL   Monocytes Relative 6  3 - 12 %   Monocytes Absolute 0.5  0.1 - 1.0 K/uL   Eosinophils Relative 0  0 - 5 %   Eosinophils Absolute 0.0  0.0 - 0.7 K/uL   Basophils  Relative 0  0 - 1 %   Basophils Absolute 0.0  0.0 - 0.1 K/uL  COMPREHENSIVE METABOLIC PANEL     Status: Abnormal   Collection Time    09/01/13  8:37 PM      Result Value Ref Range   Sodium 140  137 - 147 mEq/L   Potassium 3.9  3.7 - 5.3 mEq/L   Chloride 100  96 - 112 mEq/L   CO2 24  19 - 32 mEq/L   Glucose, Bld 142 (*) 70 - 99 mg/dL   BUN 14  6 - 23 mg/dL   Creatinine, Ser 0.48 (*) 0.50 - 1.10 mg/dL   Calcium 9.8  8.4 - 10.5 mg/dL   Total Protein 8.8 (*) 6.0 - 8.3 g/dL   Albumin 3.9  3.5 - 5.2 g/dL   AST 15  0 - 37 U/L   ALT 5  0 - 35 U/L   Alkaline Phosphatase 61  39 - 117 U/L  Total Bilirubin 0.5  0.3 - 1.2 mg/dL   GFR calc non Af Amer 87 (*) >90 mL/min   GFR calc Af Amer >90  >90 mL/min   Comment: (NOTE)     The eGFR has been calculated using the CKD EPI equation.     This calculation has not been validated in all clinical situations.     eGFR's persistently <90 mL/min signify possible Chronic Kidney     Disease.  LIPASE, BLOOD     Status: None   Collection Time    09/01/13  8:37 PM      Result Value Ref Range   Lipase 22  11 - 59 U/L  LACTIC ACID, PLASMA     Status: None   Collection Time    09/01/13 11:56 PM      Result Value Ref Range   Lactic Acid, Venous 1.3  0.5 - 2.2 mmol/L  URINALYSIS, ROUTINE W REFLEX MICROSCOPIC     Status: Abnormal   Collection Time    09/02/13  1:52 AM      Result Value Ref Range   Color, Urine STRAW (*) YELLOW   APPearance CLEAR  CLEAR   Specific Gravity, Urine 1.021  1.005 - 1.030   pH 7.0  5.0 - 8.0   Glucose, UA NEGATIVE  NEGATIVE mg/dL   Hgb urine dipstick NEGATIVE  NEGATIVE   Bilirubin Urine NEGATIVE  NEGATIVE   Ketones, ur NEGATIVE  NEGATIVE mg/dL   Protein, ur NEGATIVE  NEGATIVE mg/dL   Urobilinogen, UA 0.2  0.0 - 1.0 mg/dL   Nitrite NEGATIVE  NEGATIVE   Leukocytes, UA MODERATE (*) NEGATIVE  URINE MICROSCOPIC-ADD ON     Status: Abnormal   Collection Time    09/02/13  1:52 AM      Result Value Ref Range   Squamous  Epithelial / LPF FEW (*) RARE   WBC, UA 3-6  <3 WBC/hpf   RBC / HPF 0-2  <3 RBC/hpf   Bacteria, UA FEW (*) RARE    Ct Abdomen Pelvis W Contrast  09/02/2013   CLINICAL DATA:  Abdominal pain and vomiting. Knot noted in the right groin.  EXAM: CT ABDOMEN AND PELVIS WITH CONTRAST  TECHNIQUE: Multidetector CT imaging of the abdomen and pelvis was performed using the standard protocol following bolus administration of intravenous contrast.  CONTRAST:  177m OMNIPAQUE IOHEXOL 300 MG/ML  SOLN  COMPARISON:  DG ABD 2 VIEWS dated 04/02/2013  FINDINGS: Fibrosis and atelectasis in the lung bases. Coronary artery and mitral valve calcifications. Moderately prominent axillary lymph nodes.  Multiple cysts in the liver. No solid mass lesions appreciated. Spleen is normal. Gallbladder and bile ducts are normal. There is mild pancreatic ductal dilatation. No specific mass is demonstrated but a occult stone or intraductal lesion is not excluded. Consider MRCP for further evaluation if clinically indicated. No adrenal gland nodules. Bilateral renal cysts. Mild right renal atrophy. Multiple nonobstructing intrarenal stones in the right kidney. No hydronephrosis. Calcification of the abdominal aorta without aneurysm. Wall thickening in the aorta consistent with atherosclerotic changes. Inferior vena cava and retroperitoneal lymph nodes are normal. The stomach, small bowel, and colon are not abnormally distended. The colon is diffusely stool-filled suggesting possible constipation. No free air or free fluid in the abdomen.  Pelvis: There is a right inguinal hernia containing fluid. No bowel content. Bladder wall is thickened, suggesting possible cystitis or hypertrophy. Uterus is either atrophic or surgically absent. No pelvic mass or lymphadenopathy. No evidence of diverticulitis. Appendix  is not identified. Degenerative changes in the lumbar spine. Spondylolysis with mild spondylolisthesis at L4-5. Mild endplate depression at L3 and  L4. Changes likely represent osteoporosis. Degenerative changes in the hips.  IMPRESSION: Right inguinal hernia containing fluid. No evidence of bowel obstruction. Stool-filled colon likely to represent constipation. Pancreatic ductal dilatation. No cause identified. Occult intraductal mass or stone not excluded. Consider MRCP further evaluation if clinically indicated multiple liver cysts. Bladder wall thickening may indicate cystitis or hypertrophy.   Electronically Signed   By: Lucienne Capers M.D.   On: 09/02/2013 01:37    Review of Systems  Constitutional: Negative for weight loss.  HENT: Negative for ear discharge, ear pain, hearing loss and tinnitus.   Eyes: Negative for blurred vision, double vision, photophobia and pain.  Respiratory: Negative for cough, sputum production and shortness of breath.   Cardiovascular: Negative for chest pain.  Gastrointestinal: Negative for nausea, vomiting and abdominal pain.  Genitourinary: Negative for dysuria, urgency, frequency and flank pain.  Musculoskeletal: Negative for back pain, falls, joint pain, myalgias and neck pain.  Neurological: Negative for dizziness, tingling, sensory change, focal weakness, loss of consciousness and headaches.  Endo/Heme/Allergies: Does not bruise/bleed easily.  Psychiatric/Behavioral: Negative for depression, memory loss and substance abuse. The patient is not nervous/anxious.    Blood pressure 164/76, pulse 100, temperature 98 F (36.7 C), temperature source Oral, resp. rate 15, height 5' 6"  (1.676 m), weight 115 lb (52.164 kg), SpO2 97.00%. Physical Exam  Vitals reviewed. Constitutional: Cristina Patton is oriented to person, place, and time. Cristina Patton appears well-developed and well-nourished. Cristina Patton is cooperative. No distress. Cervical collar and nasal cannula in place.  HENT:  Head: Normocephalic and atraumatic. Head is without raccoon's eyes, without Battle's sign, without abrasion, without contusion and without laceration.   Right Ear: Hearing, tympanic membrane, external ear and ear canal normal. No lacerations. No drainage or tenderness. No foreign bodies. Tympanic membrane is not perforated. No hemotympanum.  Left Ear: Hearing, tympanic membrane, external ear and ear canal normal. No lacerations. No drainage or tenderness. No foreign bodies. Tympanic membrane is not perforated. No hemotympanum.  Nose: Nose normal. No nose lacerations, sinus tenderness, nasal deformity or nasal septal hematoma. No epistaxis.  Mouth/Throat: Uvula is midline, oropharynx is clear and moist and mucous membranes are normal. No lacerations.  Eyes: Conjunctivae, EOM and lids are normal. Pupils are equal, round, and reactive to light. No scleral icterus.  Neck: Trachea normal. No JVD present. No spinous process tenderness and no muscular tenderness present. Carotid bruit is not present. No thyromegaly present.  Cardiovascular: Normal rate, regular rhythm, normal heart sounds, intact distal pulses and normal pulses.   Respiratory: Effort normal and breath sounds normal. No respiratory distress. Cristina Patton exhibits no tenderness, no bony tenderness, no laceration and no crepitus.  GI: Soft. Normal appearance. Cristina Patton exhibits no distension. Bowel sounds are decreased. There is no tenderness. There is no rigidity, no rebound, no guarding and no CVA tenderness. A hernia is present. Hernia confirmed positive in the right inguinal area.  Musculoskeletal: Normal range of motion. Cristina Patton exhibits no edema and no tenderness.  Lymphadenopathy:    Cristina Patton has no cervical adenopathy.  Neurological: Cristina Patton is alert and oriented to person, place, and time. Cristina Patton has normal strength. No cranial nerve deficit or sensory deficit. GCS eye subscore is 4. GCS verbal subscore is 5. GCS motor subscore is 6.  Skin: Skin is warm, dry and intact. Cristina Patton is not diaphoretic.  Psychiatric: Cristina Patton has a normal mood and affect. Her  speech is normal and behavior is normal.    Assessment/Plan: 80 y/o  F with a RIH with recent incarceration  At this time secondary to her incarceration i would recommend Cristina Patton be admitted to the hospital and have her hernia repaired.  Cristina Patton is on Plavix and would need to be on hold for at least 5 d or be transfused plts prior to surgery.  I discussed this with the Cristina Patton and Cristina Patton was not willing at the time to be admitted, and wanted to speak with her nephew, who is her only relative.  I discussed that should this not be fixed, Cristina Patton risks re-incarceration and possible strangulation bowel to which Cristina Patton voiced understanding.    Ralene Ok 09/02/2013, 6:48 AM

## 2013-09-04 LAB — PATHOLOGIST SMEAR REVIEW

## 2013-12-17 ENCOUNTER — Encounter (HOSPITAL_COMMUNITY): Payer: Self-pay | Admitting: Emergency Medicine

## 2013-12-17 ENCOUNTER — Emergency Department (HOSPITAL_COMMUNITY): Payer: Medicare Other

## 2013-12-17 ENCOUNTER — Inpatient Hospital Stay (HOSPITAL_COMMUNITY): Payer: Medicare Other

## 2013-12-17 ENCOUNTER — Inpatient Hospital Stay (HOSPITAL_COMMUNITY)
Admission: EM | Admit: 2013-12-17 | Discharge: 2013-12-22 | DRG: 064 | Disposition: A | Discharge status: Skilled Nursing Facility | Payer: Medicare Other | Attending: Internal Medicine | Admitting: Internal Medicine

## 2013-12-17 DIAGNOSIS — I159 Secondary hypertension, unspecified: Secondary | ICD-10-CM

## 2013-12-17 DIAGNOSIS — R5381 Other malaise: Secondary | ICD-10-CM | POA: Diagnosis present

## 2013-12-17 DIAGNOSIS — D473 Essential (hemorrhagic) thrombocythemia: Secondary | ICD-10-CM | POA: Diagnosis present

## 2013-12-17 DIAGNOSIS — G9340 Encephalopathy, unspecified: Secondary | ICD-10-CM | POA: Diagnosis present

## 2013-12-17 DIAGNOSIS — Z66 Do not resuscitate: Secondary | ICD-10-CM | POA: Diagnosis not present

## 2013-12-17 DIAGNOSIS — I69959 Hemiplegia and hemiparesis following unspecified cerebrovascular disease affecting unspecified side: Secondary | ICD-10-CM

## 2013-12-17 DIAGNOSIS — K5900 Constipation, unspecified: Secondary | ICD-10-CM

## 2013-12-17 DIAGNOSIS — I6350 Cerebral infarction due to unspecified occlusion or stenosis of unspecified cerebral artery: Principal | ICD-10-CM | POA: Diagnosis present

## 2013-12-17 DIAGNOSIS — I639 Cerebral infarction, unspecified: Secondary | ICD-10-CM

## 2013-12-17 DIAGNOSIS — E860 Dehydration: Secondary | ICD-10-CM | POA: Diagnosis present

## 2013-12-17 DIAGNOSIS — J690 Pneumonitis due to inhalation of food and vomit: Secondary | ICD-10-CM | POA: Diagnosis present

## 2013-12-17 DIAGNOSIS — R1310 Dysphagia, unspecified: Secondary | ICD-10-CM | POA: Diagnosis present

## 2013-12-17 DIAGNOSIS — D62 Acute posthemorrhagic anemia: Secondary | ICD-10-CM

## 2013-12-17 DIAGNOSIS — D72829 Elevated white blood cell count, unspecified: Secondary | ICD-10-CM

## 2013-12-17 DIAGNOSIS — R7401 Elevation of levels of liver transaminase levels: Secondary | ICD-10-CM

## 2013-12-17 DIAGNOSIS — E43 Unspecified severe protein-calorie malnutrition: Secondary | ICD-10-CM

## 2013-12-17 DIAGNOSIS — Z7902 Long term (current) use of antithrombotics/antiplatelets: Secondary | ICD-10-CM

## 2013-12-17 DIAGNOSIS — G8194 Hemiplegia, unspecified affecting left nondominant side: Secondary | ICD-10-CM | POA: Diagnosis present

## 2013-12-17 DIAGNOSIS — I10 Essential (primary) hypertension: Secondary | ICD-10-CM | POA: Diagnosis present

## 2013-12-17 DIAGNOSIS — E785 Hyperlipidemia, unspecified: Secondary | ICD-10-CM | POA: Diagnosis present

## 2013-12-17 DIAGNOSIS — R5383 Other fatigue: Secondary | ICD-10-CM | POA: Diagnosis not present

## 2013-12-17 DIAGNOSIS — R740 Nonspecific elevation of levels of transaminase and lactic acid dehydrogenase [LDH]: Secondary | ICD-10-CM

## 2013-12-17 DIAGNOSIS — R339 Retention of urine, unspecified: Secondary | ICD-10-CM

## 2013-12-17 DIAGNOSIS — J189 Pneumonia, unspecified organism: Secondary | ICD-10-CM | POA: Diagnosis present

## 2013-12-17 DIAGNOSIS — Z681 Body mass index (BMI) 19 or less, adult: Secondary | ICD-10-CM

## 2013-12-17 DIAGNOSIS — G8190 Hemiplegia, unspecified affecting unspecified side: Secondary | ICD-10-CM

## 2013-12-17 DIAGNOSIS — D75839 Thrombocytosis, unspecified: Secondary | ICD-10-CM

## 2013-12-17 DIAGNOSIS — R64 Cachexia: Secondary | ICD-10-CM | POA: Diagnosis present

## 2013-12-17 DIAGNOSIS — Z79899 Other long term (current) drug therapy: Secondary | ICD-10-CM

## 2013-12-17 DIAGNOSIS — E870 Hyperosmolality and hypernatremia: Secondary | ICD-10-CM

## 2013-12-17 DIAGNOSIS — Z8673 Personal history of transient ischemic attack (TIA), and cerebral infarction without residual deficits: Secondary | ICD-10-CM

## 2013-12-17 HISTORY — DX: Cerebral infarction, unspecified: I63.9

## 2013-12-17 LAB — COMPREHENSIVE METABOLIC PANEL
ALT: 7 U/L (ref 0–35)
ANION GAP: 15 (ref 5–15)
AST: 13 U/L (ref 0–37)
Albumin: 3.3 g/dL — ABNORMAL LOW (ref 3.5–5.2)
Alkaline Phosphatase: 59 U/L (ref 39–117)
BUN: 11 mg/dL (ref 6–23)
CALCIUM: 9.8 mg/dL (ref 8.4–10.5)
CO2: 23 mEq/L (ref 19–32)
Chloride: 105 mEq/L (ref 96–112)
Creatinine, Ser: 0.46 mg/dL — ABNORMAL LOW (ref 0.50–1.10)
GFR calc non Af Amer: 88 mL/min — ABNORMAL LOW (ref >90)
Glucose, Bld: 96 mg/dL (ref 70–99)
Potassium: 3.6 mEq/L — ABNORMAL LOW (ref 3.7–5.3)
SODIUM: 143 meq/L (ref 137–147)
TOTAL PROTEIN: 7.9 g/dL (ref 6.0–8.3)
Total Bilirubin: 0.7 mg/dL (ref 0.3–1.2)

## 2013-12-17 LAB — URINALYSIS, ROUTINE W REFLEX MICROSCOPIC
Bilirubin Urine: NEGATIVE
GLUCOSE, UA: NEGATIVE mg/dL
HGB URINE DIPSTICK: NEGATIVE
Ketones, ur: 15 mg/dL — AB
Leukocytes, UA: NEGATIVE
Nitrite: NEGATIVE
Protein, ur: NEGATIVE mg/dL
SPECIFIC GRAVITY, URINE: 1.018 (ref 1.005–1.030)
Urobilinogen, UA: 1 mg/dL (ref 0.0–1.0)
pH: 6.5 (ref 5.0–8.0)

## 2013-12-17 LAB — RAPID URINE DRUG SCREEN, HOSP PERFORMED
Amphetamines: NOT DETECTED
Barbiturates: NOT DETECTED
Benzodiazepines: NOT DETECTED
Cocaine: NOT DETECTED
OPIATES: NOT DETECTED
Tetrahydrocannabinol: NOT DETECTED

## 2013-12-17 LAB — CBC WITH DIFFERENTIAL/PLATELET
BASOS PCT: 0 % (ref 0–1)
Basophils Absolute: 0 10*3/uL (ref 0.0–0.1)
EOS ABS: 0 10*3/uL (ref 0.0–0.7)
EOS PCT: 0 % (ref 0–5)
HCT: 40.5 % (ref 36.0–46.0)
Hemoglobin: 13.5 g/dL (ref 12.0–15.0)
LYMPHS ABS: 2.2 10*3/uL (ref 0.7–4.0)
Lymphocytes Relative: 25 % (ref 12–46)
MCH: 26.9 pg (ref 26.0–34.0)
MCHC: 33.3 g/dL (ref 30.0–36.0)
MCV: 80.7 fL (ref 78.0–100.0)
Monocytes Absolute: 0.9 10*3/uL (ref 0.1–1.0)
Monocytes Relative: 10 % (ref 3–12)
Neutro Abs: 5.7 10*3/uL (ref 1.7–7.7)
Neutrophils Relative %: 65 % (ref 43–77)
PLATELETS: 908 10*3/uL — AB (ref 150–400)
RBC: 5.02 MIL/uL (ref 3.87–5.11)
RDW: 16.5 % — ABNORMAL HIGH (ref 11.5–15.5)
WBC: 8.7 10*3/uL (ref 4.0–10.5)

## 2013-12-17 LAB — ETHANOL: Alcohol, Ethyl (B): <11 mg/dL (ref 0–11)

## 2013-12-17 LAB — APTT: aPTT: 38 seconds — ABNORMAL HIGH (ref 24–37)

## 2013-12-17 LAB — TROPONIN I: Troponin I: <0.3 ng/mL (ref <0.30)

## 2013-12-17 LAB — HEMOGLOBIN A1C
HEMOGLOBIN A1C: 5.5 % (ref <5.7)
Mean Plasma Glucose: 111 mg/dL (ref <117)

## 2013-12-17 LAB — PATHOLOGIST SMEAR REVIEW: PATH REVIEW: INCREASED

## 2013-12-17 MED ORDER — CEFTRIAXONE SODIUM 1 G IJ SOLR
1.0000 g | INTRAMUSCULAR | Status: DC
  Administered 2013-12-18 – 2013-12-20 (×3): 1 g via INTRAVENOUS
  Filled 2013-12-17 (×4): qty 10

## 2013-12-17 MED ORDER — POTASSIUM CHLORIDE 10 MEQ/100ML IV SOLN
10.0000 meq | INTRAVENOUS | Status: AC
  Administered 2013-12-17 (×4): 10 meq via INTRAVENOUS
  Filled 2013-12-17 (×3): qty 100

## 2013-12-17 MED ORDER — HYDRALAZINE HCL 20 MG/ML IJ SOLN: 10.0000 mg | Freq: Once | INTRAMUSCULAR | Status: DC

## 2013-12-17 MED ORDER — ENOXAPARIN SODIUM 40 MG/0.4ML ~~LOC~~ SOLN
40.0000 mg | SUBCUTANEOUS | Status: DC
  Administered 2013-12-17 – 2013-12-20 (×4): 40 mg via SUBCUTANEOUS
  Filled 2013-12-17 (×5): qty 0.4

## 2013-12-17 MED ORDER — ACETAMINOPHEN 650 MG RE SUPP: 650.0000 mg | RECTAL | Status: DC | PRN

## 2013-12-17 MED ORDER — SODIUM CHLORIDE 0.9 % IV SOLN
100.0000 mL/h | INTRAVENOUS | Status: DC
  Administered 2013-12-17: 100 mL/h via INTRAVENOUS

## 2013-12-17 MED ORDER — ASPIRIN 325 MG PO TABS: 325.0000 mg | ORAL_TABLET | Freq: Every day | ORAL | Status: DC

## 2013-12-17 MED ORDER — HYDRALAZINE HCL 20 MG/ML IJ SOLN
10.0000 mg | Freq: Four times a day (QID) | INTRAMUSCULAR | Status: DC | PRN
  Administered 2013-12-18 (×2): 10 mg via INTRAVENOUS
  Filled 2013-12-17 (×2): qty 1

## 2013-12-17 MED ORDER — SODIUM CHLORIDE 0.9 % IV SOLN
INTRAVENOUS | Status: AC
  Administered 2013-12-17: 17:00:00 via INTRAVENOUS

## 2013-12-17 MED ORDER — STROKE: EARLY STAGES OF RECOVERY BOOK
Freq: Once | Status: DC
Start: 2013-12-17 — End: 2013-12-22
  Filled 2013-12-17: qty 1

## 2013-12-17 MED ORDER — AZITHROMYCIN 500 MG IV SOLR
500.0000 mg | Freq: Once | INTRAVENOUS | Status: AC
  Administered 2013-12-17: 500 mg via INTRAVENOUS
  Filled 2013-12-17: qty 500

## 2013-12-17 MED ORDER — ASPIRIN 300 MG RE SUPP: 300.0000 mg | Freq: Every day | RECTAL | Status: DC

## 2013-12-17 MED ORDER — ASPIRIN 325 MG PO TABS
325.0000 mg | ORAL_TABLET | Freq: Every day | ORAL | Status: DC
  Administered 2013-12-20 – 2013-12-22 (×3): 325 mg via ORAL
  Filled 2013-12-17 (×3): qty 1

## 2013-12-17 MED ORDER — DEXTROSE 5 % IV SOLN
500.0000 mg | INTRAVENOUS | Status: DC
  Administered 2013-12-18 – 2013-12-19 (×2): 500 mg via INTRAVENOUS
  Filled 2013-12-17 (×3): qty 500

## 2013-12-17 MED ORDER — CEFTRIAXONE SODIUM 1 G IJ SOLR
1.0000 g | Freq: Once | INTRAMUSCULAR | Status: AC
  Administered 2013-12-17: 1 g via INTRAVENOUS
  Filled 2013-12-17: qty 10

## 2013-12-17 MED ORDER — SODIUM CHLORIDE 0.9 % IV BOLUS (SEPSIS)
500.0000 mL | Freq: Once | INTRAVENOUS | Status: AC
  Administered 2013-12-17: 500 mL via INTRAVENOUS

## 2013-12-17 MED ORDER — ACETAMINOPHEN 325 MG PO TABS: 650.0000 mg | ORAL_TABLET | ORAL | Status: DC | PRN

## 2013-12-17 MED ORDER — HYDRALAZINE HCL 20 MG/ML IJ SOLN
10.0000 mg | INTRAMUSCULAR | Status: AC
  Administered 2013-12-17: 10 mg via INTRAVENOUS
  Filled 2013-12-17: qty 1

## 2013-12-17 MED ORDER — ASPIRIN 300 MG RE SUPP
300.0000 mg | Freq: Every day | RECTAL | Status: DC
  Administered 2013-12-17 – 2013-12-19 (×3): 300 mg via RECTAL
  Filled 2013-12-17 (×3): qty 1

## 2013-12-17 NOTE — ED Provider Notes (Addendum)
CSN: [id number]     Arrival date & time 12/17/13  1002 History   First MD Initiated Contact with Patient 12/17/13 1002     Chief Complaint  Patient presents with  . Altered Mental Status     (Consider location/radiation/quality/duration/timing/severity/associated sxs/prior Treatment) HPI Patient presents with concern of weakness, listlessness. Patient presents today EMS. Per EMS family reports that over the past days patient has had increasing generalized weakness, decreased interactivity. There is no report of new fever, vomiting, chills, cough. Patient has a history of stroke, with persistent left-sided weakness. Patient speaks briefly, but does seem to answer questions appropriately. She denies pain, confusion, does endorse weakness. No clear precipitating, alleviating, exacerbating factors.  Past Medical History  Diagnosis Date  . Hypertension   . CVA (cerebral infarction)   . Stroke    History reviewed. No pertinent past surgical history. No family history on file. History  Substance Use Topics  . Smoking status: Never Smoker   . Smokeless tobacco: Not on file  . Alcohol Use: No   OB History   Grav Para Term Preterm Abortions TAB SAB Ect Mult Living                 Review of Systems  Constitutional:       Per HPI, otherwise negative  HENT:       Per HPI, otherwise negative  Respiratory:       Per HPI, otherwise negative  Cardiovascular:       Per HPI, otherwise negative  Gastrointestinal: Negative for vomiting.  Endocrine:       Negative aside from HPI  Genitourinary:       Neg aside from HPI   Musculoskeletal:       Per HPI, otherwise negative  Skin: Negative.   Neurological: Positive for weakness. Negative for syncope.      Allergies  Review of patient's allergies indicates no known allergies.  Home Medications   Prior to Admission medications   Medication Sig Start Date End Date Taking? Authorizing Provider  amLODipine (NORVASC) 5 MG tablet  Take 1 tablet (5 mg total) by mouth daily. 04/05/13  Yes Bobby Rumpf York, PA-C  clopidogrel (PLAVIX) 75 MG tablet Take 1 tablet (75 mg total) by mouth daily. 04/06/13  Yes Marianne L York, PA-C  polyethylene glycol (MIRALAX / GLYCOLAX) packet Take 17 g by mouth daily.   Yes Historical Provider, MD   BP 208/93  Temp(Src) 99.1 F (37.3 C) (Oral)  Resp 18  SpO2 97% Physical Exam  Nursing note and vitals reviewed. Constitutional: She is oriented to person, place, and time. She appears cachectic. She has a sickly appearance. No distress.  HENT:  Head: Normocephalic and atraumatic.  Eyes: Conjunctivae and EOM are normal.  Cardiovascular: Normal rate and regular rhythm.   Pulmonary/Chest: Effort normal and breath sounds normal. No stridor. No respiratory distress.  Abdominal: She exhibits no distension.  Musculoskeletal: She exhibits no edema.  Neurological: She is alert and oriented to person, place, and time. She displays atrophy. She displays no tremor. No cranial nerve deficit. She exhibits abnormal muscle tone. She displays no seizure activity. Coordination abnormal.  Left hemiparesis, with negligible amounts of strength in the left upper and lower extremities. Speech is slow, but appropriate, clear  Skin: Skin is warm and dry.  Psychiatric: She has a normal mood and affect. She is slowed.    ED Course  Procedures (including critical care time) Labs Review Labs Reviewed  APTT - Abnormal; Notable  for the following:    aPTT 38 (*)    All other components within normal limits  CBC WITH DIFFERENTIAL - Abnormal; Notable for the following:    RDW 16.5 (*)    Platelets 908 (*)    All other components within normal limits  COMPREHENSIVE METABOLIC PANEL - Abnormal; Notable for the following:    Potassium 3.6 (*)    Creatinine, Ser 0.46 (*)    Albumin 3.3 (*)    GFR calc non Af Amer 88 (*)    All other components within normal limits  ETHANOL  TROPONIN I  URINE RAPID DRUG SCREEN (HOSP  PERFORMED)  URINALYSIS, ROUTINE W REFLEX MICROSCOPIC  PATHOLOGIST SMEAR REVIEW    Imaging Review Dg Chest 2 View  12/17/2013   CLINICAL DATA:  Altered mental status, history stroke, hypertension  EXAM: CHEST  2 VIEW  COMPARISON:  04/02/2013  FINDINGS: Upper normal heart size.  Normal mediastinal contours and pulmonary vascularity.  Rotation to the LEFT.  Mild bibasilar atelectasis.  LEFT upper lobe infiltrate present.  Remaining lungs clear.  No pleural effusion or pneumothorax.  Bones demineralized.  Atherosclerotic calcification aortic arch.  IMPRESSION: LEFT upper lobe infiltrate and mild bibasilar atelectasis.   Electronically Signed   By: Lavonia Dana M.D.   On: 12/17/2013 11:20   Ct Head Wo Contrast  12/17/2013   CLINICAL DATA:  Subjective decrease in overall activity, with increasing LEFT-sided weakness. History of prior stroke. History of hypertension.  EXAM: CT HEAD WITHOUT CONTRAST  TECHNIQUE: Contiguous axial images were obtained from the base of the skull through the vertex without contrast.  COMPARISON:  MR head 03/22/2013.  CT head 03/20/2013.  FINDINGS: No evidence for acute infarction, hemorrhage, mass lesion, hydrocephalus, or extra-axial fluid. Generalized cerebral and cerebellar atrophy. Chronic microvascular ischemic change affects the periventricular and subcortical white matter. There is a remote RIGHT cerebellar infarct which is difficult to visualize. The calvarium is intact. Vascular calcification. There is no acute sinus disease. Trace RIGHT mastoid effusion. When technique differences are considered, similar appearance to priors.  IMPRESSION: No acute findings.  Age-related changes as described.   Electronically Signed   By: Rolla Flatten M.D.   On: 12/17/2013 11:21     EKG Interpretation   Date/Time:  Monday December 17 2013 10:19:40 EDT Ventricular Rate:  100 PR Interval:  144 QRS Duration: 78 QT Interval:  350 QTC Calculation: 451 R Axis:   17 Text Interpretation:   Sinus tachycardia Atrial premature complex Consider  right atrial enlargement RSR' in V1 or V2, probably normal variant  Abnormal inferior Q waves Sinus tachycardia Artifact RSR prime Abnormal  ekg Confirmed by Carmin Muskrat  MD [phone]) on 12/17/2013 10:40:50 AM     Update: Our technician has found a small insect near the patient, and there is some concern for bedbugs - contact precautions are being initiated.  MDM  Patient presents with concerns of listlessness, fatigue.  Patient has a history of prior stroke, and neurologically seems at baseline. Patient's evaluation is most notable for demonstration of pneumonia. Patient was started on antibiotics, and given her age, comorbidities, admitted for further evaluation and management   Carmin Muskrat, MD 12/17/13 1151  1:53 PM On admission, her family became available, and related to the hospitalist, the patient's left-sided weakness seems either more pronounced or different.  Patient will remain in the emergency department pending MRI, to give the onset of symptoms at least 2 days ago, she is beyond the window for emergent  intervention.   Carmin Muskrat, MD 12/17/13 1355   3:34 PM I discussed the patient's case with our radiologist, and with neurology. Given some progression of previously identified disease, including concern for new distal occlusion of the right ACA, and small infarcts, patient still requires admission. No acute interventions are indicated, after discussion with neurology.   Carmin Muskrat, MD 12/17/13 1536

## 2013-12-17 NOTE — ED Notes (Signed)
Phlebotomy at bedside.

## 2013-12-17 NOTE — ED Notes (Signed)
Per EMS - pt coming from home. Family called ems because they noticed decreased activity for the pt over the past weekend. Hx of stroke with left sided deficits, sts they aren't positive but think the left sided weakness may be worse. Pt answering questions appropriately. BP 190/100 HR 105. 22 G in left wrist.

## 2013-12-17 NOTE — Consult Note (Signed)
Referring Physician: Dr. Maryland Pink    Chief Complaint:  worsening left hemiparesis.  HPI:                                                                                                                                         Cristina Patton is an 80 y.o. 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 Family is not available at this moment and she seems confused, unable to provide reliable clinical information, and thus all clinical information is obtained from her chart. She resides at home and today " presents to the hospital after she was brought in by family due to increasing weakness over the last few days. Patient appears to have some degree of confusion and is unable to provide much history. Unfortunately family had left. Previous notes reveal that patient actually was able to move her left upper and lower extremities, despite the stroke. She was unable to do that when I examined. So, I called one of her nephews and he tells me that this left-sided weakness has become more profound over the last 2-3 days". CT brain revealed no acute intracranial abnormality but subsequent MRI brain revealed patchy acute infarcts in the right ACA / MCA watershed territory involving the superior frontal gyrus, mostly in the pre motor area. No mass effect or hemorrhage. MRA brain: new distal right ACA occlusion and high-grade irregularity of the A2 segment. Patient is on plavix. Date last known well: uncertain Time last known well: uncertain tPA Given: no    Past Medical History  Diagnosis Date  . Hypertension   . CVA (cerebral infarction)   . Stroke     History reviewed. No pertinent past surgical history.  History reviewed. No pertinent family history. Social History:  reports that she has never smoked. She does not have  any smokeless tobacco history on file. She reports that she does not drink alcohol or use illicit drugs.  Allergies: No Known Allergies  Medications:                                                                                                                           I have reviewed the patient's current medications. Scheduled:  ROS: unable to obtain due to mental status                                                                                                                      History obtained from chart review  Physical exam: pleasant, confused female in no apparent distress. Blood pressure 176/84, pulse 113, temperature 99 F (37.2 C), temperature source Oral, resp. rate 20, SpO2 98.00%. Head: normocephalic. Neck: supple, no bruits, no JVD. Cardiac: no murmurs. Lungs: clear. Abdomen: soft, no tender, no mass. Extremities: no edema. Neurologic Examination:                                                                                                      General: Mental Status: Awake, disoriented to place-year-month-day-situation.  Speech seems to be fluent without evidence of aphasia.  Able to follow simple step commands without difficulty. Cranial Nerves: II: Discs and vsual fields couldn't be assessed due to patient's poor cooperation, pupils equal, round, reactive to light III,IV, VI: ptosis not present, right gaze preference. V,VII: smile asymmetric due to left face weakness, facial light touch sensation can not be reliably assessed. VIII: hearing grossly normal bilaterally IX,X: gag reflex present XI: bilateral shoulder shrug no tested XII: midline tongue extension without atrophy or fasciculations Motor: Significant for left HP Sensory: reacts to noxious stimuli by withdrawing right greater than left side Deep Tendon Reflexes:  2+ all over Plantars: Right: downgoing   Left: mute Cerebellar: Unable to test as patient doesn't participate in this  testing. Gait:  Unable to test  Results for orders placed during the hospital encounter of 12/17/13 (from the past 48 hour(s))  APTT     Status: Abnormal   Collection Time    12/17/13 10:34 AM      Result Value Ref Range   aPTT 38 (*) 24 - 37 seconds   Comment:            IF BASELINE aPTT IS ELEVATED,     SUGGEST PATIENT RISK ASSESSMENT     BE USED TO DETERMINE APPROPRIATE     ANTICOAGULANT THERAPY.  CBC WITH DIFFERENTIAL     Status: Abnormal   Collection Time    12/17/13 10:34 AM      Result Value Ref Range   WBC 8.7  4.0 - 10.5 K/uL   RBC 5.02  3.87 - 5.11 MIL/uL   Hemoglobin 13.5  12.0 - 15.0 g/dL   HCT 40.5  36.0 - 46.0 %   MCV 80.7  78.0 - 100.0 fL   MCH 26.9  26.0 -  34.0 pg   MCHC 33.3  30.0 - 36.0 g/dL   RDW 16.5 (*) 11.5 - 15.5 %   Platelets 908 (*) 150 - 400 K/uL   Comment: CRITICAL RESULT CALLED TO, READ BACK BY AND VERIFIED WITH:     BRITTANY CHANDLER,RN AT 1110 12/17/13 BY ZBEECH.   Neutrophils Relative % 65  43 - 77 %   Neutro Abs 5.7  1.7 - 7.7 K/uL   Lymphocytes Relative 25  12 - 46 %   Lymphs Abs 2.2  0.7 - 4.0 K/uL   Monocytes Relative 10  3 - 12 %   Monocytes Absolute 0.9  0.1 - 1.0 K/uL   Eosinophils Relative 0  0 - 5 %   Eosinophils Absolute 0.0  0.0 - 0.7 K/uL   Basophils Relative 0  0 - 1 %   Basophils Absolute 0.0  0.0 - 0.1 K/uL  COMPREHENSIVE METABOLIC PANEL     Status: Abnormal   Collection Time    12/17/13 10:34 AM      Result Value Ref Range   Sodium 143  137 - 147 mEq/L   Potassium 3.6 (*) 3.7 - 5.3 mEq/L   Chloride 105  96 - 112 mEq/L   CO2 23  19 - 32 mEq/L   Glucose, Bld 96  70 - 99 mg/dL   BUN 11  6 - 23 mg/dL   Creatinine, Ser 0.46 (*) 0.50 - 1.10 mg/dL   Calcium 9.8  8.4 - 10.5 mg/dL   Total Protein 7.9  6.0 - 8.3 g/dL   Albumin 3.3 (*) 3.5 - 5.2 g/dL   AST 13  0 - 37 U/L   ALT 7  0 - 35 U/L   Alkaline Phosphatase 59  39 - 117 U/L   Total Bilirubin 0.7  0.3 - 1.2 mg/dL   GFR calc non Af Amer 88 (*) >90 mL/min   GFR calc Af  Amer >90  >90 mL/min   Comment: (NOTE)     The eGFR has been calculated using the CKD EPI equation.     This calculation has not been validated in all clinical situations.     eGFR's persistently <90 mL/min signify possible Chronic Kidney     Disease.   Anion gap 15  5 - 15  ETHANOL     Status: None   Collection Time    12/17/13 10:34 AM      Result Value Ref Range   Alcohol, Ethyl (B) <11  0 - 11 mg/dL   Comment:            LOWEST DETECTABLE LIMIT FOR     SERUM ALCOHOL IS 11 mg/dL     FOR MEDICAL PURPOSES ONLY  TROPONIN I     Status: None   Collection Time    12/17/13 10:34 AM      Result Value Ref Range   Troponin I <0.30  <0.30 ng/mL   Comment:            Due to the release kinetics of cTnI,     a negative result within the first hours     of the onset of symptoms does not rule out     myocardial infarction with certainty.     If myocardial infarction is still suspected,     repeat the test at appropriate intervals.  URINE RAPID DRUG SCREEN (HOSP PERFORMED)     Status: None   Collection Time    12/17/13  1:10 PM  Result Value Ref Range   Opiates NONE DETECTED  NONE DETECTED   Cocaine NONE DETECTED  NONE DETECTED   Benzodiazepines NONE DETECTED  NONE DETECTED   Amphetamines NONE DETECTED  NONE DETECTED   Tetrahydrocannabinol NONE DETECTED  NONE DETECTED   Barbiturates NONE DETECTED  NONE DETECTED   Comment:            DRUG SCREEN FOR MEDICAL PURPOSES     ONLY.  IF CONFIRMATION IS NEEDED     FOR ANY PURPOSE, NOTIFY LAB     WITHIN 5 DAYS.                LOWEST DETECTABLE LIMITS     FOR URINE DRUG SCREEN     Drug Class       Cutoff (ng/mL)     Amphetamine      1000     Barbiturate      200     Benzodiazepine   315     Tricyclics       176     Opiates          300     Cocaine          300     THC              50  URINALYSIS, ROUTINE W REFLEX MICROSCOPIC     Status: Abnormal   Collection Time    12/17/13  1:10 PM      Result Value Ref Range   Color, Urine  YELLOW  YELLOW   APPearance CLEAR  CLEAR   Specific Gravity, Urine 1.018  1.005 - 1.030   pH 6.5  5.0 - 8.0   Glucose, UA NEGATIVE  NEGATIVE mg/dL   Hgb urine dipstick NEGATIVE  NEGATIVE   Bilirubin Urine NEGATIVE  NEGATIVE   Ketones, ur 15 (*) NEGATIVE mg/dL   Protein, ur NEGATIVE  NEGATIVE mg/dL   Urobilinogen, UA 1.0  0.0 - 1.0 mg/dL   Nitrite NEGATIVE  NEGATIVE   Leukocytes, UA NEGATIVE  NEGATIVE   Comment: MICROSCOPIC NOT DONE ON URINES WITH NEGATIVE PROTEIN, BLOOD, LEUKOCYTES, NITRITE, OR GLUCOSE <1000 mg/dL.   Dg Chest 2 View  12/17/2013   CLINICAL DATA:  Altered mental status, history stroke, hypertension  EXAM: CHEST  2 VIEW  COMPARISON:  04/02/2013  FINDINGS: Upper normal heart size.  Normal mediastinal contours and pulmonary vascularity.  Rotation to the LEFT.  Mild bibasilar atelectasis.  LEFT upper lobe infiltrate present.  Remaining lungs clear.  No pleural effusion or pneumothorax.  Bones demineralized.  Atherosclerotic calcification aortic arch.  IMPRESSION: LEFT upper lobe infiltrate and mild bibasilar atelectasis.   Electronically Signed   By: Lavonia Dana M.D.   On: 12/17/2013 11:20   Ct Head Wo Contrast  12/17/2013   CLINICAL DATA:  Subjective decrease in overall activity, with increasing LEFT-sided weakness. History of prior stroke. History of hypertension.  EXAM: CT HEAD WITHOUT CONTRAST  TECHNIQUE: Contiguous axial images were obtained from the base of the skull through the vertex without contrast.  COMPARISON:  MR head 03/22/2013.  CT head 03/20/2013.  FINDINGS: No evidence for acute infarction, hemorrhage, mass lesion, hydrocephalus, or extra-axial fluid. Generalized cerebral and cerebellar atrophy. Chronic microvascular ischemic change affects the periventricular and subcortical white matter. There is a remote RIGHT cerebellar infarct which is difficult to visualize. The calvarium is intact. Vascular calcification. There is no acute sinus disease. Trace RIGHT mastoid  effusion. When technique differences are considered,  similar appearance to priors.  IMPRESSION: No acute findings.  Age-related changes as described.   Electronically Signed   By: Rolla Flatten M.D.   On: 12/17/2013 11:21   Mr Jodene Nam Head Wo Contrast  12/17/2013   ADDENDUM REPORT: 12/17/2013 15:25  ADDENDUM: Study discussed by telephone with Dr. Carmin Muskrat on 12/17/2013 at 15:20 hours.   Electronically Signed   By: Lars Pinks M.D.   On: 12/17/2013 15:25   12/17/2013   CLINICAL DATA:  80 year old female with increasing weakness and confusion. Initial encounter. History of small right inferior cerebellar infarct in 2014.  EXAM: MRI HEAD WITHOUT CONTRAST  MRA HEAD WITHOUT CONTRAST  TECHNIQUE: Multiplanar, multiecho pulse sequences of the brain and surrounding structures were obtained without intravenous contrast. Angiographic images of the head were obtained using MRA technique without contrast.  COMPARISON:  Head CT without contrast 1050 hr today. Brain MRI and MRA 03/22/2013.  FINDINGS: MRI HEAD FINDINGS  Patchy clustered cortical and white matter restricted diffusion in the right superior frontal gyrus, mostly the pre motor area (see series 5, images 23-28).  No left hemisphere or posterior fossa restricted diffusion identified.  Major intracranial vascular flow voids are stable.  Mild if any associated T2 and FLAIR hyperintensity to correspond with the acute diffusion abnormality. Underlying Patchy and confluent white matter T2 and FLAIR signal changes are stable. No acute intracranial hemorrhage identified. No midline shift, mass effect, or evidence of intracranial mass lesion. No ventriculomegaly. Stable pituitary gland, cervicomedullary junction, and visualized cervical spine. Normal bone marrow signal.  New mild right mastoid effusion. Rightward gaze deviation. Stable paranasal sinuses. Visualized scalp soft tissues are within normal limits.  MRA HEAD FINDINGS  Stable antegrade flow in the posterior  circulation with dominant distal right vertebral artery. Widespread basilar artery irregularity does appear progressed along with mild multifocal basilar artery stenosis. The moderate stenosis just proximal to the SCA origins appears stable.  Widespread bilateral PCA irregularity with high-grade distal P2 stenoses appears stable. Posterior communicating arteries are diminutive or absent.  Stable antegrade flow in the left ICA siphon. No left ICA stenosis identified. Left carotid terminus MCA, and left ACA origins remain within normal limits. Increased irregularity of the visualized left ACA branches, but with Stable distal left ACA flow. Moderate irregularity of the left MCA branches is stable, with stable distal left MCA flow.  Stable antegrade flow in the right ICA siphon but increased irregularity in the vertical P each wrists segment now resulting in mild stenosis. Stable right ICA terminus infundibulum. Patent right ICA terminus with stable right MCA and ACA origins.  New high-grade irregularity and attenuation of flow signal in the low right ACA distal A2 segment, with poor distal right ACA flow signal.  Proximal right M2 branch irregularity and stenosis appears not significantly changed since 2014. Right MCA branch flow signal appears stable, with no interval major right MCA branch occlusion identified.  IMPRESSION: 1. Patchy acute infarcts in the right ACA / MCA watershed territory involving the superior frontal gyrus, mostly in the pre motor area. No mass effect or hemorrhage. 2. New distal right ACA occlusion and high-grade irregularity of the A2 segment, concordant with #1. Right MCA M2 branch atherosclerosis and flow appears stable since 2014. 3. Interval mild progression of right ICA siphon and basilar artery atherosclerosis, but no other new or progressed hemodynamically significant stenosis compared to the 2014 MRA.  Electronically Signed: By: Lars Pinks M.D. On: 12/17/2013 15:18   Mr Brain Wo  Contrast  12/17/2013   ADDENDUM REPORT: 12/17/2013 15:25  ADDENDUM: Study discussed by telephone with Dr. Carmin Muskrat on 12/17/2013 at 15:20 hours.   Electronically Signed   By: Lars Pinks M.D.   On: 12/17/2013 15:25   12/17/2013   CLINICAL DATA:  80 year old female with increasing weakness and confusion. Initial encounter. History of small right inferior cerebellar infarct in 2014.  EXAM: MRI HEAD WITHOUT CONTRAST  MRA HEAD WITHOUT CONTRAST  TECHNIQUE: Multiplanar, multiecho pulse sequences of the brain and surrounding structures were obtained without intravenous contrast. Angiographic images of the head were obtained using MRA technique without contrast.  COMPARISON:  Head CT without contrast 1050 hr today. Brain MRI and MRA 03/22/2013.  FINDINGS: MRI HEAD FINDINGS  Patchy clustered cortical and white matter restricted diffusion in the right superior frontal gyrus, mostly the pre motor area (see series 5, images 23-28).  No left hemisphere or posterior fossa restricted diffusion identified.  Major intracranial vascular flow voids are stable.  Mild if any associated T2 and FLAIR hyperintensity to correspond with the acute diffusion abnormality. Underlying Patchy and confluent white matter T2 and FLAIR signal changes are stable. No acute intracranial hemorrhage identified. No midline shift, mass effect, or evidence of intracranial mass lesion. No ventriculomegaly. Stable pituitary gland, cervicomedullary junction, and visualized cervical spine. Normal bone marrow signal.  New mild right mastoid effusion. Rightward gaze deviation. Stable paranasal sinuses. Visualized scalp soft tissues are within normal limits.  MRA HEAD FINDINGS  Stable antegrade flow in the posterior circulation with dominant distal right vertebral artery. Widespread basilar artery irregularity does appear progressed along with mild multifocal basilar artery stenosis. The moderate stenosis just proximal to the SCA origins appears stable.   Widespread bilateral PCA irregularity with high-grade distal P2 stenoses appears stable. Posterior communicating arteries are diminutive or absent.  Stable antegrade flow in the left ICA siphon. No left ICA stenosis identified. Left carotid terminus MCA, and left ACA origins remain within normal limits. Increased irregularity of the visualized left ACA branches, but with Stable distal left ACA flow. Moderate irregularity of the left MCA branches is stable, with stable distal left MCA flow.  Stable antegrade flow in the right ICA siphon but increased irregularity in the vertical P each wrists segment now resulting in mild stenosis. Stable right ICA terminus infundibulum. Patent right ICA terminus with stable right MCA and ACA origins.  New high-grade irregularity and attenuation of flow signal in the low right ACA distal A2 segment, with poor distal right ACA flow signal.  Proximal right M2 branch irregularity and stenosis appears not significantly changed since 2014. Right MCA branch flow signal appears stable, with no interval major right MCA branch occlusion identified.  IMPRESSION: 1. Patchy acute infarcts in the right ACA / MCA watershed territory involving the superior frontal gyrus, mostly in the pre motor area. No mass effect or hemorrhage. 2. New distal right ACA occlusion and high-grade irregularity of the A2 segment, concordant with #1. Right MCA M2 branch atherosclerosis and flow appears stable since 2014. 3. Interval mild progression of right ICA siphon and basilar artery atherosclerosis, but no other new or progressed hemodynamically significant stenosis compared to the 2014 MRA.  Electronically Signed: By: Lars Pinks M.D. On: 12/17/2013 15:18     Assessment: 80 y.o. female with worsening left HP and confusion, and MRI revealing patchy acute infarcts in the right ACA / MCA watershed territory involving the superior frontal gyrus, mostly in the pre motor area. No mass effect or hemorrhage. MRA brain:  new distal right ACA occlusion and high-grade irregularity of the A2 segment. Patient takes plavix at home. May consider adding low dose aspirin as she has symptomatic intracranial atheosclerotic disease and dual antiplatelet therapy purportedly more effective for secondary stroke prevention in that particular scenario. Admit to medicine, complete stroke work up. Stroke team will resume care tomorrow.   Stroke Risk Factors - age, HTN, previous stroke.  Plan: 1. HgbA1c, fasting lipid panel 2. MRI, MRA  of the brain without contrast 3. Echocardiogram 4. Carotid dopplers 5. Prophylactic therapy-plavix and aspirin after passing swallowing evaluation. 6. Risk factor modification 7. Telemetry monitoring 8. Frequent neuro checks 9. PT/OT SLP   Dorian Pod, MD [facility] [phone]  12/17/2013, 3:37 PM

## 2013-12-17 NOTE — H&P (Signed)
[facility] History and Physical  Cristina Patton VFI:[id number] DOB: March 09, 1927 DOA: 12/17/2013   PCP: Karen Kays to determine her primary care physician. She doesn't know. Specialists: None  Chief Complaint: Increasing weakness over the past few days  HPI: Cristina Patton is a 80 y.o. female with a past medical history of stroke, hypertension, which appears to be very poorly controlled, who resides at home. Her stroke was in November of 2014 involving the right inferior cerebellar area. There was a nonhemorrhagic infarct. Following this stroke she spent some time in a nursing facility. Subsequently, she was able to go back home. She also has an inguinal hernia for which he was recently seen by a general surgeon in May, but refused to be admitted at that time. She presents to the hospital after she was brought in by family due to increasing weakness over the last few days. Patient appears to have some degree of confusion and is unable to provide much history. Unfortunately family had left. Previous notes reveal that patient actually was able to move her left upper and lower extremities, despite the stroke. She was unable to do that when I examined. So, I called one of her nephews and he tells me that this left-sided weakness has become more profound over the last 2-3 days.  Home Medications: Prior to Admission medications   Medication Sig Start Date End Date Taking? Authorizing Provider  amLODipine (NORVASC) 5 MG tablet Take 1 tablet (5 mg total) by mouth daily. 04/05/13  Yes Bobby Rumpf York, PA-C  clopidogrel (PLAVIX) 75 MG tablet Take 1 tablet (75 mg total) by mouth daily. 04/06/13  Yes Marianne L York, PA-C  polyethylene glycol (MIRALAX / GLYCOLAX) packet Take 17 g by mouth daily.   Yes Historical Provider, MD    Allergies: No Known Allergies  Past Medical History: Past Medical History  Diagnosis Date  . Hypertension   . CVA (cerebral infarction)   . Stroke     History  reviewed. No pertinent past surgical history.  Social History: She apparently lives with family. She gets around in a wheelchair. No other information is elicited from this patient  Family History: Unable to obtain due to her mental confusion  Review of Systems - unable to obtain due to her mental confusion  Physical Examination  Filed Vitals:   12/17/13 1130 12/17/13 1145 12/17/13 1200 12/17/13 1215  BP: 191/83 179/81 189/87 187/79  Pulse: 93 91 95 93  Temp:      TempSrc:      Resp:      SpO2: 96% 96% 97% 97%    BP 187/79  Pulse 93  Temp(Src) 99.1 F (37.3 C) (Oral)  Resp 18  SpO2 97%  General appearance: alert, appears stated age, distracted and no distress Head: Normocephalic, without obvious abnormality, atraumatic Eyes: conjunctivae/corneas clear. PERRL, EOM's intact. Throat: dry mm Neck: no adenopathy, no carotid bruit, no JVD, supple, symmetrical, trachea midline and thyroid not enlarged, symmetric, no tenderness/mass/nodules Resp: Crackles appreciated over the left lung field. No wheezing. No rhonchi. Cardio: regular rate and rhythm, S1, S2 normal, no murmur, click, rub or gallop GI: Abdomen is soft. Nontender, nondistended. Bowel sounds are present. No masses, organomegaly. No obvious hernia noted in the lower abdomen. Extremities: extremities normal, atraumatic, no cyanosis or edema Pulses: 2+ and symmetric Skin: Skin color, texture, turgor normal. No rashes or lesions Lymph nodes: Cervical, supraclavicular, and axillary nodes normal. Neurologic: She is awake. She is alert. Appears to have left-sided droop. Strength  in the left upper and lower extremities 0/5. Gait could not be assessed. Patient not fully cooperative with other examination.  Laboratory Data: Results for orders placed during the hospital encounter of 12/17/13 (from the past 48 hour(s))  APTT     Status: Abnormal   Collection Time    12/17/13 10:34 AM      Result Value Ref Range   aPTT 38 (*) 24  - 37 seconds   Comment:            IF BASELINE aPTT IS ELEVATED,     SUGGEST PATIENT RISK ASSESSMENT     BE USED TO DETERMINE APPROPRIATE     ANTICOAGULANT THERAPY.  CBC WITH DIFFERENTIAL     Status: Abnormal   Collection Time    12/17/13 10:34 AM      Result Value Ref Range   WBC 8.7  4.0 - 10.5 K/uL   RBC 5.02  3.87 - 5.11 MIL/uL   Hemoglobin 13.5  12.0 - 15.0 g/dL   HCT 40.5  36.0 - 46.0 %   MCV 80.7  78.0 - 100.0 fL   MCH 26.9  26.0 - 34.0 pg   MCHC 33.3  30.0 - 36.0 g/dL   RDW 16.5 (*) 11.5 - 15.5 %   Platelets 908 (*) 150 - 400 K/uL   Comment: CRITICAL RESULT CALLED TO, READ BACK BY AND VERIFIED WITH:     BRITTANY CHANDLER,RN AT 1110 12/17/13 BY ZBEECH.   Neutrophils Relative % 65  43 - 77 %   Neutro Abs 5.7  1.7 - 7.7 K/uL   Lymphocytes Relative 25  12 - 46 %   Lymphs Abs 2.2  0.7 - 4.0 K/uL   Monocytes Relative 10  3 - 12 %   Monocytes Absolute 0.9  0.1 - 1.0 K/uL   Eosinophils Relative 0  0 - 5 %   Eosinophils Absolute 0.0  0.0 - 0.7 K/uL   Basophils Relative 0  0 - 1 %   Basophils Absolute 0.0  0.0 - 0.1 K/uL  COMPREHENSIVE METABOLIC PANEL     Status: Abnormal   Collection Time    12/17/13 10:34 AM      Result Value Ref Range   Sodium 143  137 - 147 mEq/L   Potassium 3.6 (*) 3.7 - 5.3 mEq/L   Chloride 105  96 - 112 mEq/L   CO2 23  19 - 32 mEq/L   Glucose, Bld 96  70 - 99 mg/dL   BUN 11  6 - 23 mg/dL   Creatinine, Ser 0.46 (*) 0.50 - 1.10 mg/dL   Calcium 9.8  8.4 - 10.5 mg/dL   Total Protein 7.9  6.0 - 8.3 g/dL   Albumin 3.3 (*) 3.5 - 5.2 g/dL   AST 13  0 - 37 U/L   ALT 7  0 - 35 U/L   Alkaline Phosphatase 59  39 - 117 U/L   Total Bilirubin 0.7  0.3 - 1.2 mg/dL   GFR calc non Af Amer 88 (*) >90 mL/min   GFR calc Af Amer >90  >90 mL/min   Comment: (NOTE)     The eGFR has been calculated using the CKD EPI equation.     This calculation has not been validated in all clinical situations.     eGFR's persistently <90 mL/min signify possible Chronic Kidney      Disease.   Anion gap 15  5 - 15  ETHANOL     Status: None  Collection Time    12/17/13 10:34 AM      Result Value Ref Range   Alcohol, Ethyl (B) <11  0 - 11 mg/dL   Comment:            LOWEST DETECTABLE LIMIT FOR     SERUM ALCOHOL IS 11 mg/dL     FOR MEDICAL PURPOSES ONLY  TROPONIN I     Status: None   Collection Time    12/17/13 10:34 AM      Result Value Ref Range   Troponin I <0.30  <0.30 ng/mL   Comment:            Due to the release kinetics of cTnI,     a negative result within the first hours     of the onset of symptoms does not rule out     myocardial infarction with certainty.     If myocardial infarction is still suspected,     repeat the test at appropriate intervals.    Radiology Reports: Dg Chest 2 View  12/17/2013   CLINICAL DATA:  Altered mental status, history stroke, hypertension  EXAM: CHEST  2 VIEW  COMPARISON:  04/02/2013  FINDINGS: Upper normal heart size.  Normal mediastinal contours and pulmonary vascularity.  Rotation to the LEFT.  Mild bibasilar atelectasis.  LEFT upper lobe infiltrate present.  Remaining lungs clear.  No pleural effusion or pneumothorax.  Bones demineralized.  Atherosclerotic calcification aortic arch.  IMPRESSION: LEFT upper lobe infiltrate and mild bibasilar atelectasis.   Electronically Signed   By: Lavonia Dana M.D.   On: 12/17/2013 11:20   Ct Head Wo Contrast  12/17/2013   CLINICAL DATA:  Subjective decrease in overall activity, with increasing LEFT-sided weakness. History of prior stroke. History of hypertension.  EXAM: CT HEAD WITHOUT CONTRAST  TECHNIQUE: Contiguous axial images were obtained from the base of the skull through the vertex without contrast.  COMPARISON:  MR head 03/22/2013.  CT head 03/20/2013.  FINDINGS: No evidence for acute infarction, hemorrhage, mass lesion, hydrocephalus, or extra-axial fluid. Generalized cerebral and cerebellar atrophy. Chronic microvascular ischemic change affects the periventricular and  subcortical white matter. There is a remote RIGHT cerebellar infarct which is difficult to visualize. The calvarium is intact. Vascular calcification. There is no acute sinus disease. Trace RIGHT mastoid effusion. When technique differences are considered, similar appearance to priors.  IMPRESSION: No acute findings.  Age-related changes as described.   Electronically Signed   By: Rolla Flatten M.D.   On: 12/17/2013 11:21    Electrocardiogram: Sinus tachycardia at 100 beats per minute. Normal axis. RSR pattern is noted in V1. Tall R waves suggesting left ventricular hypertrophy. No concerning ST or T-wave changes are noted.  Problem List  Principal Problem:   CAP (community acquired pneumonia) Active Problems:   Dehydration   Acute encephalopathy   History of stroke   Accelerated hypertension   Assessment: This is 80 year old, African American female, who was brought in for generalized weakness and is found to have left-lung pneumonia. She lives at home, so this will be considered Community-acquired pneumonia. Aspiration is also a possibility. More importantly she has a left-sided weakness, which appears to be new. It is quite likely, that she's had an acute stroke. Exact time of onset of stroke is not clear. So, she is not a candidate for TPA as this could have happened over the last 2-3 days. CT head was unremarkable for acute findings. She is noted to have elevated blood pressure,  which she has had in the same range in the past.  Plan: #1 left-sided weakness: This appears to be new. She did not have this degree of weakness as a result of her previous stroke. This appears to be a new acute cerebrovascular event. We will proceed with MRI, MRA. Neurology will be consulted. Swallow screen aill be obtained. She'll be given aspirin for now. Once she is able to swallow Plavix will need to be resumed. She is not on a statin medication. This will need to be initiated once she is able to take orally.  PT/OT and speech therapy will be consulted. Neurochecks. Fall and aspiration precautions.  #2 community-acquired pneumonia versus aspiration pneumonia: She'll be treated with ceftriaxone and azithromycin. Swallow evaluation will be obtained. Oxygen as needed. She'll be kept n.p.o. for now  #3 accelerated hypertension: Before we were able to ascertain that this left-sided weakness was new patient was given a dose of hydralazine to bring down her blood pressure. But for now we will allow permissive hypertension.   #4 Thrombocythemia: Platelet counts noted to be elevated. They have been elevated in the past, but only more recently, they have been in this range. This could be the etiology for her stroke. This will likely require further workup depending on her clinical progress. No history of smoking in the past.  #5 recent right inguinal hernia: This was diagnosed in May. She was offered surgery at that time, but she declined. Doesn't appear to have any hernia on examination at this time. No masses appreciated. Continue to monitor. Her abdomen is completely benign.  DVT Prophylaxis: Lovenox Code Status: Full code Family Communication: Discussed with her nephew Mena Goes at [phone] Disposition Plan: Admit to neuro telemetry   Further management decisions will depend on results of further testing and patient's response to treatment.   [facility]  [facility] Pager [phone]  If 7PM-7AM, please contact night-coverage [email or web address] Password [email or web address]  12/17/2013, 1:11 PM  Disclaimer: This note was dictated with voice recognition software. Similar sounding words can inadvertently be transcribed and may not be corrected upon review.

## 2013-12-18 DIAGNOSIS — E43 Unspecified severe protein-calorie malnutrition: Secondary | ICD-10-CM

## 2013-12-18 DIAGNOSIS — I6350 Cerebral infarction due to unspecified occlusion or stenosis of unspecified cerebral artery: Principal | ICD-10-CM

## 2013-12-18 DIAGNOSIS — I10 Essential (primary) hypertension: Secondary | ICD-10-CM

## 2013-12-18 DIAGNOSIS — I639 Cerebral infarction, unspecified: Secondary | ICD-10-CM

## 2013-12-18 DIAGNOSIS — Z8673 Personal history of transient ischemic attack (TIA), and cerebral infarction without residual deficits: Secondary | ICD-10-CM

## 2013-12-18 DIAGNOSIS — I158 Other secondary hypertension: Secondary | ICD-10-CM

## 2013-12-18 LAB — COMPREHENSIVE METABOLIC PANEL
ALBUMIN: 3.3 g/dL — AB (ref 3.5–5.2)
ALT: 5 U/L (ref 0–35)
ANION GAP: 18 — AB (ref 5–15)
AST: 14 U/L (ref 0–37)
Alkaline Phosphatase: 61 U/L (ref 39–117)
BILIRUBIN TOTAL: 0.8 mg/dL (ref 0.3–1.2)
BUN: 10 mg/dL (ref 6–23)
CO2: 19 meq/L (ref 19–32)
CREATININE: 0.42 mg/dL — AB (ref 0.50–1.10)
Calcium: 9.7 mg/dL (ref 8.4–10.5)
Chloride: 101 mEq/L (ref 96–112)
GFR calc Af Amer: >90 mL/min (ref >90)
Glucose, Bld: 143 mg/dL — ABNORMAL HIGH (ref 70–99)
Potassium: 3.8 mEq/L (ref 3.7–5.3)
Sodium: 138 mEq/L (ref 137–147)
Total Protein: 7.8 g/dL (ref 6.0–8.3)

## 2013-12-18 LAB — CBC
HCT: 38.7 % (ref 36.0–46.0)
Hemoglobin: 13.1 g/dL (ref 12.0–15.0)
MCH: 27.4 pg (ref 26.0–34.0)
MCHC: 33.9 g/dL (ref 30.0–36.0)
MCV: 81 fL (ref 78.0–100.0)
PLATELETS: 929 10*3/uL — AB (ref 150–400)
RBC: 4.78 MIL/uL (ref 3.87–5.11)
RDW: 16.2 % — AB (ref 11.5–15.5)
WBC: 11.7 10*3/uL — AB (ref 4.0–10.5)

## 2013-12-18 LAB — GLUCOSE, CAPILLARY
GLUCOSE-CAPILLARY: 113 mg/dL — AB (ref 70–99)
Glucose-Capillary: 123 mg/dL — ABNORMAL HIGH (ref 70–99)
Glucose-Capillary: 110 mg/dL — ABNORMAL HIGH (ref 70–99)

## 2013-12-18 LAB — LIPID PANEL
Cholesterol: 170 mg/dL (ref 0–200)
HDL: 55 mg/dL (ref >39)
LDL CALC: 104 mg/dL — AB (ref 0–99)
TRIGLYCERIDES: 57 mg/dL (ref <150)
Total CHOL/HDL Ratio: 3.1 RATIO
VLDL: 11 mg/dL (ref 0–40)

## 2013-12-18 LAB — HIV ANTIBODY (ROUTINE TESTING W REFLEX): HIV: NONREACTIVE

## 2013-12-18 LAB — STREP PNEUMONIAE URINARY ANTIGEN: Strep Pneumo Urinary Antigen: NEGATIVE

## 2013-12-18 NOTE — Progress Notes (Signed)
STROKE TEAM PROGRESS NOTE   HISTORY Cristina Patton is an 80 y.o. 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 Family is not available and she seems confused, unable to provide reliable clinical information, and thus all clinical information is obtained from her chart. She resides at home and today 12/17/2013 " presents to the hospital after she was brought in by family due to increasing weakness over the last few days. Patient appears to have some degree of confusion and is unable to provide much history. Unfortunately family had left. Previous notes reveal that patient actually was able to move her left upper and lower extremities, despite the stroke. She was unable to do that when I examined. Called one of her nephews and who tells me that this left-sided weakness has become more profound over the last 2-3 days".  CT brain revealed no acute intracranial abnormality but subsequent MRI brain revealed patchy acute infarcts in the right ACA / MCA watershed territory involving the superior frontal gyrus, mostly in the pre motor area. No mass effect or hemorrhage. MRA brain: new distal right ACA occlusion and high-grade irregularity of the A2 segment. Patient is on plavix. last known well is unknown. Patient was not administered TPA secondary to unknown last known well. She was admitted for further evaluation and treatment.  SUBJECTIVE (INTERVAL HISTORY) No family is at the bedside.  Overall she feels her condition is no change. Eye closed, lethargic and not able to answer questions, only mumble words. Not following commands.  OBJECTIVE Temp:  [98.1 F (36.7 C)-99.1 F (37.3 C)] 98.3 F (36.8 C) (08/18 0512) Pulse Rate:  [91-120] 120 (08/18 0512) Resp:  [16-28] 18 (08/18 0512) BP: (159-208)/(57-98) 177/76 mmHg (08/18  0512) SpO2:  [96 %-100 %] 96 % (08/18 0512) Weight:  [52.2 kg (115 lb 1.3 oz)] 52.2 kg (115 lb 1.3 oz) (08/17 1604)  Dg Chest 2 View 12/17/2013   IMPRESSION: LEFT upper lobe infiltrate and mild bibasilar atelectasis.     Ct Head Wo Contrast 12/17/2013   IMPRESSION: No acute findings.  Age-related changes as described.     Mr Jodene Nam Head Wo Contrast 12/17/2013   IMPRESSION: 1. Patchy acute infarcts in the right ACA / MCA watershed territory involving the superior frontal gyrus, mostly in the pre motor area. No mass effect or hemorrhage. 2. New distal right ACA occlusion and high-grade irregularity of the A2 segment, concordant with #1. Right MCA M2 branch atherosclerosis and flow appears stable since 2014. 3. Interval mild progression of right ICA siphon and basilar artery atherosclerosis, but no other new or progressed hemodynamically significant stenosis compared to the 2014 MRA.    CUS - pending  2D echo - pending  Recent Results (from the past 2160 hour(s))  APTT     Status: Abnormal   Collection Time    12/17/13 10:34 AM      Result Value Ref Range   aPTT 38 (*) 24 - 37 seconds   Comment:            IF BASELINE aPTT IS ELEVATED,     SUGGEST PATIENT RISK ASSESSMENT     BE USED TO DETERMINE APPROPRIATE     ANTICOAGULANT THERAPY.  CBC WITH DIFFERENTIAL     Status: Abnormal   Collection Time    12/17/13 10:34 AM  Result Value Ref Range   WBC 8.7  4.0 - 10.5 K/uL   RBC 5.02  3.87 - 5.11 MIL/uL   Hemoglobin 13.5  12.0 - 15.0 g/dL   HCT 40.5  36.0 - 46.0 %   MCV 80.7  78.0 - 100.0 fL   MCH 26.9  26.0 - 34.0 pg   MCHC 33.3  30.0 - 36.0 g/dL   RDW 16.5 (*) 11.5 - 15.5 %   Platelets 908 (*) 150 - 400 K/uL   Comment: CRITICAL RESULT CALLED TO, READ BACK BY AND VERIFIED WITH:     BRITTANY CHANDLER,RN AT 1110 12/17/13 BY ZBEECH.   Neutrophils Relative % 65  43 - 77 %   Neutro Abs 5.7  1.7 - 7.7 K/uL   Lymphocytes Relative 25  12 - 46 %   Lymphs Abs 2.2  0.7 - 4.0 K/uL   Monocytes  Relative 10  3 - 12 %   Monocytes Absolute 0.9  0.1 - 1.0 K/uL   Eosinophils Relative 0  0 - 5 %   Eosinophils Absolute 0.0  0.0 - 0.7 K/uL   Basophils Relative 0  0 - 1 %   Basophils Absolute 0.0  0.0 - 0.1 K/uL  COMPREHENSIVE METABOLIC PANEL     Status: Abnormal   Collection Time    12/17/13 10:34 AM      Result Value Ref Range   Sodium 143  137 - 147 mEq/L   Potassium 3.6 (*) 3.7 - 5.3 mEq/L   Chloride 105  96 - 112 mEq/L   CO2 23  19 - 32 mEq/L   Glucose, Bld 96  70 - 99 mg/dL   BUN 11  6 - 23 mg/dL   Creatinine, Ser 0.46 (*) 0.50 - 1.10 mg/dL   Calcium 9.8  8.4 - 10.5 mg/dL   Total Protein 7.9  6.0 - 8.3 g/dL   Albumin 3.3 (*) 3.5 - 5.2 g/dL   AST 13  0 - 37 U/L   ALT 7  0 - 35 U/L   Alkaline Phosphatase 59  39 - 117 U/L   Total Bilirubin 0.7  0.3 - 1.2 mg/dL   GFR calc non Af Amer 88 (*) >90 mL/min   GFR calc Af Amer >90  >90 mL/min   Comment: (NOTE)     The eGFR has been calculated using the CKD EPI equation.     This calculation has not been validated in all clinical situations.     eGFR's persistently <90 mL/min signify possible Chronic Kidney     Disease.   Anion gap 15  5 - 15  ETHANOL     Status: None   Collection Time    12/17/13 10:34 AM      Result Value Ref Range   Alcohol, Ethyl (B) <11  0 - 11 mg/dL   Comment:            LOWEST DETECTABLE LIMIT FOR     SERUM ALCOHOL IS 11 mg/dL     FOR MEDICAL PURPOSES ONLY  TROPONIN I     Status: None   Collection Time    12/17/13 10:34 AM      Result Value Ref Range   Troponin I <0.30  <0.30 ng/mL   Comment:            Due to the release kinetics of cTnI,     a negative result within the first hours     of the onset of symptoms  does not rule out     myocardial infarction with certainty.     If myocardial infarction is still suspected,     repeat the test at appropriate intervals.  PATHOLOGIST SMEAR REVIEW     Status: None   Collection Time    12/17/13 10:34 AM      Result Value Ref Range   Path Review  MARKEDLY INCREASED PLATELET COUNT     Comment: OF >900,000. PRIMARY HEMATOLOGIC     DISORDER NOT EXCLUDED. SUGGEST FULL     HEMATOLOGIC EVALUATION, IF     CLINICALLY INDICATED.     Reviewed by Chrystie Nose. Saralyn Pilar, M.D.     12/17/13.  HEMOGLOBIN A1C     Status: None   Collection Time    12/17/13 10:34 AM      Result Value Ref Range   Hemoglobin A1C 5.5  <5.7 %   Comment: (NOTE)                                                                               According to the ADA Clinical Practice Recommendations for 2011, when     HbA1c is used as a screening test:      >=6.5%   Diagnostic of Diabetes Mellitus               (if abnormal result is confirmed)     5.7-6.4%   Increased risk of developing Diabetes Mellitus     References:Diagnosis and Classification of Diabetes Mellitus,Diabetes     ENID,7824,23(NTIRW 1):S62-S69 and Standards of Medical Care in             Diabetes - 2011,Diabetes Care,2011,34 (Suppl 1):S11-S61.   Mean Plasma Glucose 111  <117 mg/dL   Comment: Performed at [facility] ([place])     Status: None   Collection Time    12/17/13  1:10 PM      Result Value Ref Range   Opiates NONE DETECTED  NONE DETECTED   Cocaine NONE DETECTED  NONE DETECTED   Benzodiazepines NONE DETECTED  NONE DETECTED   Amphetamines NONE DETECTED  NONE DETECTED   Tetrahydrocannabinol NONE DETECTED  NONE DETECTED   Barbiturates NONE DETECTED  NONE DETECTED   Comment:            DRUG SCREEN FOR MEDICAL PURPOSES     ONLY.  IF CONFIRMATION IS NEEDED     FOR ANY PURPOSE, NOTIFY LAB     WITHIN 5 DAYS.                LOWEST DETECTABLE LIMITS     FOR URINE DRUG SCREEN     Drug Class       Cutoff (ng/mL)     Amphetamine      1000     Barbiturate      200     Benzodiazepine   431     Tricyclics       540     Opiates          300     Cocaine          300     THC  50  URINALYSIS, ROUTINE W REFLEX MICROSCOPIC     Status: Abnormal   Collection  Time    12/17/13  1:10 PM      Result Value Ref Range   Color, Urine YELLOW  YELLOW   APPearance CLEAR  CLEAR   Specific Gravity, Urine 1.018  1.005 - 1.030   pH 6.5  5.0 - 8.0   Glucose, UA NEGATIVE  NEGATIVE mg/dL   Hgb urine dipstick NEGATIVE  NEGATIVE   Bilirubin Urine NEGATIVE  NEGATIVE   Ketones, ur 15 (*) NEGATIVE mg/dL   Protein, ur NEGATIVE  NEGATIVE mg/dL   Urobilinogen, UA 1.0  0.0 - 1.0 mg/dL   Nitrite NEGATIVE  NEGATIVE   Leukocytes, UA NEGATIVE  NEGATIVE   Comment: MICROSCOPIC NOT DONE ON URINES WITH NEGATIVE PROTEIN, BLOOD, LEUKOCYTES, NITRITE, OR GLUCOSE <1000 mg/dL.  HIV ANTIBODY (ROUTINE TESTING)     Status: None   Collection Time    12/17/13  6:28 PM      Result Value Ref Range   HIV 1&2 Ab, 4th Generation NONREACTIVE  NONREACTIVE   Comment: (NOTE)     A NONREACTIVE HIV Ag/Ab result does not exclude HIV infection since     the time frame for seroconversion is variable. If acute HIV infection     is suspected, a HIV-1 RNA Qualitative TMA test is recommended.     HIV-1/2 Antibody Diff         Not indicated.     HIV-1 RNA, Qual TMA           Not indicated.     PLEASE NOTE: This information has been disclosed to you from records     whose confidentiality may be protected by state law. If your state     requires such protection, then the state law prohibits you from making     any further disclosure of the information without the specific written     consent of the person to whom it pertains, or as otherwise permitted     by law. A general authorization for the release of medical or other     information is NOT sufficient for this purpose.     The performance of this assay has not been clinically validated in     patients less than 64 years old.     Performed at [facility]  LIPID PANEL     Status: Abnormal   Collection Time    12/18/13  5:09 AM      Result Value Ref Range   Cholesterol 170  0 - 200 mg/dL   Triglycerides 57  <150 mg/dL   HDL 55  >39  mg/dL   Total CHOL/HDL Ratio 3.1     VLDL 11  0 - 40 mg/dL   LDL Cholesterol 104 (*) 0 - 99 mg/dL   Comment:            Total Cholesterol/HDL:CHD Risk     Coronary Heart Disease Risk Table                         Men   Women      1/2 Average Risk   3.4   3.3      Average Risk       5.0   4.4      2 X Average Risk   9.6   7.1      3 X Average Risk  23.4  11.0                Use the calculated Patient Ratio     above and the CHD Risk Table     to determine the patient's CHD Risk.                ATP III CLASSIFICATION (LDL):      <100     mg/dL   Optimal      100-129  mg/dL   Near or Above                        Optimal      130-159  mg/dL   Borderline      160-189  mg/dL   High      >190     mg/dL   Very High  COMPREHENSIVE METABOLIC PANEL     Status: Abnormal   Collection Time    12/18/13  5:09 AM      Result Value Ref Range   Sodium 138  137 - 147 mEq/L   Potassium 3.8  3.7 - 5.3 mEq/L   Chloride 101  96 - 112 mEq/L   CO2 19  19 - 32 mEq/L   Glucose, Bld 143 (*) 70 - 99 mg/dL   BUN 10  6 - 23 mg/dL   Creatinine, Ser 0.42 (*) 0.50 - 1.10 mg/dL   Calcium 9.7  8.4 - 10.5 mg/dL   Total Protein 7.8  6.0 - 8.3 g/dL   Albumin 3.3 (*) 3.5 - 5.2 g/dL   AST 14  0 - 37 U/L   ALT 5  0 - 35 U/L   Alkaline Phosphatase 61  39 - 117 U/L   Total Bilirubin 0.8  0.3 - 1.2 mg/dL   GFR calc non Af Amer >90  >90 mL/min   GFR calc Af Amer >90  >90 mL/min   Comment: (NOTE)     The eGFR has been calculated using the CKD EPI equation.     This calculation has not been validated in all clinical situations.     eGFR's persistently <90 mL/min signify possible Chronic Kidney     Disease.   Anion gap 18 (*) 5 - 15  CBC     Status: Abnormal   Collection Time    12/18/13  5:09 AM      Result Value Ref Range   WBC 11.7 (*) 4.0 - 10.5 K/uL   RBC 4.78  3.87 - 5.11 MIL/uL   Hemoglobin 13.1  12.0 - 15.0 g/dL   HCT 38.7  36.0 - 46.0 %   MCV 81.0  78.0 - 100.0 fL   MCH 27.4  26.0 - 34.0 pg    MCHC 33.9  30.0 - 36.0 g/dL   RDW 16.2 (*) 11.5 - 15.5 %   Platelets 929 (*) 150 - 400 K/uL   Comment: REPEATED TO VERIFY     CRITICAL VALUE NOTED.  VALUE IS CONSISTENT WITH PREVIOUSLY REPORTED AND CALLED VALUE.  STREP PNEUMONIAE URINARY ANTIGEN     Status: None   Collection Time    12/18/13  5:54 AM      Result Value Ref Range   Strep Pneumo Urinary Antigen NEGATIVE  NEGATIVE   Comment:            Infection due to S. pneumoniae     cannot be absolutely ruled out     since the antigen present  may be below the detection limit     of the test.  GLUCOSE, CAPILLARY     Status: Abnormal   Collection Time    12/18/13 11:34 AM      Result Value Ref Range   Glucose-Capillary 123 (*) 70 - 99 mg/dL  GLUCOSE, CAPILLARY     Status: Abnormal   Collection Time    12/18/13  5:01 PM      Result Value Ref Range   Glucose-Capillary 113 (*) 70 - 99 mg/dL   Comment 1 Notify RN     Comment 2 Documented in Chart      PHYSICAL EXAM  General - thin, well developed, lying in bed, eyes close and not following commands.  Ophthalmologic - not cooperative for exam, not open eyes spontaneously.  Cardiovascular - Regular rate and rhythm with no murmur.  Mental Status:  Drowsy, not able to do the orientation questions. Paucity of speech, mumbles with intelligible words, not able to follow simple step commands.  Cranial Nerves:  II: Discs and vsual fields couldn't be assessed due to patient's poor cooperation, pupils equal, round, reactive to light  III,IV, VI: doll's eye present, able to roll over bilaterally.  V,VII: facial symmetric, facial light touch sensation can not be reliably assessed.  VIII: hearing grossly symmetrical bilaterally  IX,X: gag reflex present  XI: bilateral shoulder shrug no tested  XII: tongue in middle inside mouth  Motor:  Significant for left UE and LE no spontaneous movement, right UE and LE spontaneous movement. Sensory: reacts to noxious stimuli by withdrawing  right greater than left side  Deep Tendon Reflexes:  1+ all over Plantars:  Right: downgoing Left: mute  Cerebellar:  Unable to test as patient doesn't participate in this testing.  Gait:  Unable to test   ASSESSMENT/PLAN  Cristina Patton is a 80 y.o. female with hx of right small cerebellar stroke Nov 2014 and diffuse arthrosclerotic changes intracranial vessels including right A2 stenosis presenting with worsening left hemiparesis. She did not receive IV t-PA due to delay in arrival. Imaging confirms right ACA infarct with advance of right A2 stenosis and occlusion. Stroke work up underway.  Stroke/TIA:  right ACA thrombotic infarct secondary to severe diffuse intracranial atherosclerosis     clopidogrel 75 mg orally every day prior to admission, now on aspirin 300 mg suppository. Once able to swallow, recommend aspirin and plavix together x 3 weeks then plavix alone  MRI - Patchy R ACA infarct, superior frontal gyrus in the pre motor area  MRA - New distal R ACA A2 occlusion and high-grade irregularity. R MCA M2 branch atherosclerosis and flow stable since 2014. R ICA siphon and basilar artery atherosclerosis.   2D Echo  pending   Carotid  pending  HgbA1c 5.5 WNL  Lovenox 40 mg sq daily for VTE prophylaxis  Dysphagia, NPO for now  OOB with assistance  Therapy needs: pending  Risk factor management/education. She needs aggressive risk factor control  Accelerated Hypertension   Home meds:  norvasc 5. Not yet resumed in hospital (NPO)  BP 159-208/57-93 past 24h  Received 2 doses IV hydralazine during the night  SBP goal - gradually normalize 120-140/70-90  Hyperlipidemia  LDL 104   Patient on no statin at home  Recommend lipitor once able to swallow  LDL goal < 100    Other Stroke Risk Factors   Cachectic, Body mass index is 18.49 kg/(m^2).    Hx stroke - R cerebellar w/ residual L  HP 03/22/2013   age  Other Active Problems  Community  acquired PNA, on ceftriaxone & azithromycin  Plts elevated, 908, 929 - likely acute reaction - keep monitoring  Other Pertinent History  Right inguinal hernia, surgery declined May 2015  Hospital day # 1  Burnetta Sabin, MSN, RN, ANVP-BC, ANP-BC, [facility] Pager: [phone] 12/18/2013 11:00 AM  I, the attending vascular neurologist, have personally obtained a history, examined the patient, evaluated laboratory data, individually viewed imaging studies, and formulated the assessment and plan of care.  I have made any additions or clarifications directly to the above note and agree with the findings and plan as currently documented.    Rosalin Hawking, MD PhD Stroke Neurology 12/18/2013 6:12 PM    To contact Stroke Continuity provider, please refer to [email or web address] After hours, contact General Neurology

## 2013-12-18 NOTE — Progress Notes (Signed)
INITIAL NUTRITION ASSESSMENT  DOCUMENTATION CODES Per approved criteria  -Severe malnutrition in the context of chronic illness -Underweight   INTERVENTION: Advance diet as medically/cognitively appropriate  If TF started, recommend Jevity 1.2 formula -- initiate at 15 ml/hr and increase by 10 ml every 8 hours to goal rate of 55 ml/hr to provide 1584 kcals, 73 gm protein, 1065 ml of free water RD to follow for nutrition care plan  NUTRITION DIAGNOSIS: Inadequate oral intake related to inability to eat, CVA as evidenced by NPO status  Goal: Pt to meet >/= 90% of their estimated nutrition needs   Monitor:  PO diet advancement vs TF initiation, weight, labs, I/O's  Reason for Assessment: Low Braden  80 y.o. female  Admitting Dx: Acute CVA (cerebrovascular accident)  ASSESSMENT: 80 y.o. Female with PMH of stroke, hypertension, which appears to be very poorly controlled, who resides at home; presented to the hospital after she was brought in by family due to increasing weakness over the last few days.   MRI showed patchy acute infarcts in the R ACA/MCA watershed territory involving the superior frontal gyrus, mostly in the pre motor area.  RD unable to obtain nutrition hx; patient confused; s/p bedside swallow evaluation 8/18 -- + suspected cognitive/neuro based dysphagia; SLP recommending NPO status at this time, however, hopeful for PO diet advancement when pt more alert; per wt readings, patient has had a 6% weight loss x 8 months (not significant for time frame); question whether temporary nutrition support to be initiated.  Low braden score places patient at risk for further skin breakdown.  Nutrition Focused Physical Exam:  Subcutaneous Fat:  Orbital Region: N/A Upper Arm Region: moderate depletion Thoracic and Lumbar Region: N/A  Muscle:  Temple Region: moderate depletion Clavicle Bone Region: severe depletion Clavicle and Acromion Bone Region: severe  depletion Scapular Bone Region: N/A Dorsal Hand: severe depletion Patellar Region: severe depletion Anterior Thigh Region: severe depletion Posterior Calf Region: severe depletion  Edema: none  Patient continues to meet criteria for severe malnutrition in the context of chronic illness as evidenced by severe muscle loss and severe subcutaneous fat loss.  Height: Ht Readings from Last 1 Encounters:  12/17/13 5' 6.14" (1.68 m)    Weight: Wt Readings from Last 1 Encounters:  12/17/13 115 lb 1.3 oz (52.2 kg)    Ideal Body Weight: 130 lb  % Ideal Body Weight: 88%  Wt Readings from Last 10 Encounters:  12/17/13 115 lb 1.3 oz (52.2 kg)  09/01/13 115 lb (52.164 kg)  04/03/13 123 lb 7.3 oz (56 kg)  03/23/13 130 lb 1.1 oz (59 kg)    Usual Body Weight: 123 lb -- December 2014  % Usual Body Weight: 93%  BMI:  Body mass index is 18.49 kg/(m^2).  Estimated Nutritional Needs: Kcal: 1400-1600 Protein: 65-75 gm Fluid: >/= 1.5 L  Skin: old healed pressure ulcer to sacrum/coccyx  Diet Order: NPO  EDUCATION NEEDS: -No education needs identified at this time  Last BM: none documented  Labs:   Recent Labs Lab 12/17/13 1034 12/18/13 0509  NA 143 138  K 3.6* 3.8  CL 105 101  CO2 23 19  BUN 11 10  CREATININE 0.46* 0.42*  CALCIUM 9.8 9.7  GLUCOSE 96 143*    CBG (last 3)   Recent Labs  12/18/13 1134  GLUCAP 123*    Scheduled Meds: .  stroke: mapping our early stages of recovery book   Does not apply Once  . aspirin  325 mg  Oral Daily   Or  . aspirin  300 mg Rectal Daily  . azithromycin  500 mg Intravenous Q24H  . cefTRIAXone (ROCEPHIN)  IV  1 g Intravenous Q24H  . enoxaparin (LOVENOX) injection  40 mg Subcutaneous Q24H    Continuous Infusions:   Past Medical History  Diagnosis Date  . Hypertension   . CVA (cerebral infarction)   . Stroke     History reviewed. No pertinent past surgical history.  Arthur Holms, RD, LDN Pager #:  [phone] After-Hours Pager #: [phone]

## 2013-12-18 NOTE — Progress Notes (Signed)
Utilization review completed.  

## 2013-12-18 NOTE — Progress Notes (Signed)
[facility]                                                                              Patient Demographics  Cristina Patton, is a 80 y.o. female, DOB - 1926-11-24, WVP:[id number]  Admit date - 12/17/2013   Admitting Physician Bonnielee Haff, MD  Outpatient Primary MD for the patient is PROVIDER NOT [place]  LOS - 1   Chief Complaint  Patient presents with  . Altered Mental Status      Interim history 80 year old female with history of stroke hypertension that is poorly controlled presented to the emergency department with increasing weakness over the last few days. Patient had some left-sided weakness which appear to be made. MRI did show acute stroke with ACA occlusion. Neurology on board. Patient will need to be on aspirin and Plavix for 3 weeks, once she is able to swallow. After 3 weeks she can discontinue Plavix alone. Pending CVA work up. Patient also has community-acquired pneumonia versus aspiration pneumonia.  Assessment & Plan   Acute CVA /Left-sided weakness  -MRI of the brain:Patchy acute infarcts in the right ACA / MCA watershed territory involving the superior frontal gyrus, mostly in the pre motor area -MRA of the head: New distal right ACA occlusion and high-grade irregularity of the A2 segment, -Pending carotid Doppler, echocardiogram -Hemoglobin A1c 5.5, LDL 104 -PT and OT consults -Speech evaluated patient currently n.p.o. -Continue rectal aspirin -Neurology consulted appreciated, recommended aspirin and Plavix for 3 weeks, thereafter Plavix alone -Patient will likely need nursing home placement, currently lives at home alone.  Accelerated hypertension -Allowing permissive hypertension with hydralazine as needed  Community acquired pneumonia versus possible aspiration pneumonia -Patient currently on ceftriaxone and azithromycin -Continue oxygen as needed to maintain saturations above 92% -Start pneumonia and Legionella urine antigen  pending  Thrombocythemia -Elevated platelets, 929 -Seems to be chronically elevated however more so at this time -Possibly acute phase reactant -Will likely require workup once she improves clinically -Patient has no history of smoking -Blood smear pending  Recent right inguinal hernia -Diagnosed in May of 2015 -Surgery was offered at that time, patient declined. -Appears stable  Code Status: Full  Family Communication: None at bedside  Disposition Plan: Admitted, pending workup  Time Spent in minutes   30 minutes  Procedures  None  Consults   Neurology  DVT Prophylaxis  Lovenox  Lab Results  Component Value Date   PLT 929* 12/18/2013    Medications  Scheduled Meds: .  stroke: mapping our early stages of recovery book   Does not apply Once  . aspirin  325 mg Oral Daily   Or  . aspirin  300 mg Rectal Daily  . azithromycin  500 mg Intravenous Q24H  . cefTRIAXone (ROCEPHIN)  IV  1 g Intravenous Q24H  . enoxaparin (LOVENOX) injection  40 mg Subcutaneous Q24H   Continuous Infusions:  PRN Meds:.acetaminophen, acetaminophen, hydrALAZINE  Antibiotics    Anti-infectives   Start     Dose/Rate Route Frequency Ordered Stop   12/18/13 1200  cefTRIAXone (ROCEPHIN) 1 g in dextrose 5 % 50 mL IVPB     1 g 100 mL/hr over 30 Minutes Intravenous Every 24  hours 12/17/13 1638 12/25/13 1159   12/18/13 1200  azithromycin (ZITHROMAX) 500 mg in dextrose 5 % 250 mL IVPB     500 mg 250 mL/hr over 60 Minutes Intravenous Every 24 hours 12/17/13 1638 12/25/13 1159   12/17/13 1200  azithromycin (ZITHROMAX) 500 mg in dextrose 5 % 250 mL IVPB     500 mg 250 mL/hr over 60 Minutes Intravenous  Once 12/17/13 1152 12/17/13 1401   12/17/13 1200  cefTRIAXone (ROCEPHIN) 1 g in dextrose 5 % 50 mL IVPB     1 g 100 mL/hr over 30 Minutes Intravenous  Once 12/17/13 1152 12/17/13 1248        Subjective:   Caylan Gavigan seen and examined today.  Patient has no complaints today. Patient  states she's tired. She denies any dizziness, headache chest pain, shortness of breath, abdominal pain.  Objective:   Filed Vitals:   12/18/13 0230 12/18/13 0245 12/18/13 0400 12/18/13 0512  BP: 190/77 159/57 164/62 177/76  Pulse:  113 118 120  Temp:   98.5 F (36.9 C) 98.3 F (36.8 C)  TempSrc:   Axillary Axillary  Resp:  18 18 18   Height:      Weight:      SpO2:  99% 99% 96%    Wt Readings from Last 3 Encounters:  12/17/13 52.2 kg (115 lb 1.3 oz)  09/01/13 52.164 kg (115 lb)  04/03/13 56 kg (123 lb 7.3 oz)    No intake or output data in the 24 hours ending 12/18/13 0957  Exam  General: Well developed, well nourished, NAD, appears stated age  HEENT: NCAT, PERRLA, EOMI, Anicteic Sclera, mucous membranes moist.   Neck: Supple, no JVD, no masses  Cardiovascular: S1 S2 auscultated, no rubs, murmurs or gallops. Regular rate and rhythm.  Respiratory: Clear to auscultation bilaterally with equal chest rise  Abdomen: Soft, nontender, nondistended, + bowel sounds  Extremities: warm dry without cyanosis clubbing or edema  Neuro: AAOx3, cranial nerves grossly intact. Strength 4/5 in RUE/RLL.  Strength 0/5 LUE/LLE.  Data Review   Micro Results No results found for this or any previous visit (from the past 240 hour(s)).  Radiology Reports Dg Chest 2 View  12/17/2013   CLINICAL DATA:  Altered mental status, history stroke, hypertension  EXAM: CHEST  2 VIEW  COMPARISON:  04/02/2013  FINDINGS: Upper normal heart size.  Normal mediastinal contours and pulmonary vascularity.  Rotation to the LEFT.  Mild bibasilar atelectasis.  LEFT upper lobe infiltrate present.  Remaining lungs clear.  No pleural effusion or pneumothorax.  Bones demineralized.  Atherosclerotic calcification aortic arch.  IMPRESSION: LEFT upper lobe infiltrate and mild bibasilar atelectasis.   Electronically Signed   By: Lavonia Dana M.D.   On: 12/17/2013 11:20   Ct Head Wo Contrast  12/17/2013   CLINICAL DATA:   Subjective decrease in overall activity, with increasing LEFT-sided weakness. History of prior stroke. History of hypertension.  EXAM: CT HEAD WITHOUT CONTRAST  TECHNIQUE: Contiguous axial images were obtained from the base of the skull through the vertex without contrast.  COMPARISON:  MR head 03/22/2013.  CT head 03/20/2013.  FINDINGS: No evidence for acute infarction, hemorrhage, mass lesion, hydrocephalus, or extra-axial fluid. Generalized cerebral and cerebellar atrophy. Chronic microvascular ischemic change affects the periventricular and subcortical white matter. There is a remote RIGHT cerebellar infarct which is difficult to visualize. The calvarium is intact. Vascular calcification. There is no acute sinus disease. Trace RIGHT mastoid effusion. When technique differences are considered, similar  appearance to priors.  IMPRESSION: No acute findings.  Age-related changes as described.   Electronically Signed   By: Rolla Flatten M.D.   On: 12/17/2013 11:21   Mr Jodene Nam Head Wo Contrast  12/17/2013   ADDENDUM REPORT: 12/17/2013 15:25  ADDENDUM: Study discussed by telephone with Dr. Carmin Muskrat on 12/17/2013 at 15:20 hours.   Electronically Signed   By: Lars Pinks M.D.   On: 12/17/2013 15:25   12/17/2013   CLINICAL DATA:  80 year old female with increasing weakness and confusion. Initial encounter. History of small right inferior cerebellar infarct in 2014.  EXAM: MRI HEAD WITHOUT CONTRAST  MRA HEAD WITHOUT CONTRAST  TECHNIQUE: Multiplanar, multiecho pulse sequences of the brain and surrounding structures were obtained without intravenous contrast. Angiographic images of the head were obtained using MRA technique without contrast.  COMPARISON:  Head CT without contrast 1050 hr today. Brain MRI and MRA 03/22/2013.  FINDINGS: MRI HEAD FINDINGS  Patchy clustered cortical and white matter restricted diffusion in the right superior frontal gyrus, mostly the pre motor area (see series 5, images 23-28).  No left  hemisphere or posterior fossa restricted diffusion identified.  Major intracranial vascular flow voids are stable.  Mild if any associated T2 and FLAIR hyperintensity to correspond with the acute diffusion abnormality. Underlying Patchy and confluent white matter T2 and FLAIR signal changes are stable. No acute intracranial hemorrhage identified. No midline shift, mass effect, or evidence of intracranial mass lesion. No ventriculomegaly. Stable pituitary gland, cervicomedullary junction, and visualized cervical spine. Normal bone marrow signal.  New mild right mastoid effusion. Rightward gaze deviation. Stable paranasal sinuses. Visualized scalp soft tissues are within normal limits.  MRA HEAD FINDINGS  Stable antegrade flow in the posterior circulation with dominant distal right vertebral artery. Widespread basilar artery irregularity does appear progressed along with mild multifocal basilar artery stenosis. The moderate stenosis just proximal to the SCA origins appears stable.  Widespread bilateral PCA irregularity with high-grade distal P2 stenoses appears stable. Posterior communicating arteries are diminutive or absent.  Stable antegrade flow in the left ICA siphon. No left ICA stenosis identified. Left carotid terminus MCA, and left ACA origins remain within normal limits. Increased irregularity of the visualized left ACA branches, but with Stable distal left ACA flow. Moderate irregularity of the left MCA branches is stable, with stable distal left MCA flow.  Stable antegrade flow in the right ICA siphon but increased irregularity in the vertical P each wrists segment now resulting in mild stenosis. Stable right ICA terminus infundibulum. Patent right ICA terminus with stable right MCA and ACA origins.  New high-grade irregularity and attenuation of flow signal in the low right ACA distal A2 segment, with poor distal right ACA flow signal.  Proximal right M2 branch irregularity and stenosis appears not  significantly changed since 2014. Right MCA branch flow signal appears stable, with no interval major right MCA branch occlusion identified.  IMPRESSION: 1. Patchy acute infarcts in the right ACA / MCA watershed territory involving the superior frontal gyrus, mostly in the pre motor area. No mass effect or hemorrhage. 2. New distal right ACA occlusion and high-grade irregularity of the A2 segment, concordant with #1. Right MCA M2 branch atherosclerosis and flow appears stable since 2014. 3. Interval mild progression of right ICA siphon and basilar artery atherosclerosis, but no other new or progressed hemodynamically significant stenosis compared to the 2014 MRA.  Electronically Signed: By: Lars Pinks M.D. On: 12/17/2013 15:18   Mr Brain Wo Contrast  12/17/2013  ADDENDUM REPORT: 12/17/2013 15:25  ADDENDUM: Study discussed by telephone with Dr. Carmin Muskrat on 12/17/2013 at 15:20 hours.   Electronically Signed   By: Lars Pinks M.D.   On: 12/17/2013 15:25   12/17/2013   CLINICAL DATA:  80 year old female with increasing weakness and confusion. Initial encounter. History of small right inferior cerebellar infarct in 2014.  EXAM: MRI HEAD WITHOUT CONTRAST  MRA HEAD WITHOUT CONTRAST  TECHNIQUE: Multiplanar, multiecho pulse sequences of the brain and surrounding structures were obtained without intravenous contrast. Angiographic images of the head were obtained using MRA technique without contrast.  COMPARISON:  Head CT without contrast 1050 hr today. Brain MRI and MRA 03/22/2013.  FINDINGS: MRI HEAD FINDINGS  Patchy clustered cortical and white matter restricted diffusion in the right superior frontal gyrus, mostly the pre motor area (see series 5, images 23-28).  No left hemisphere or posterior fossa restricted diffusion identified.  Major intracranial vascular flow voids are stable.  Mild if any associated T2 and FLAIR hyperintensity to correspond with the acute diffusion abnormality. Underlying Patchy and  confluent white matter T2 and FLAIR signal changes are stable. No acute intracranial hemorrhage identified. No midline shift, mass effect, or evidence of intracranial mass lesion. No ventriculomegaly. Stable pituitary gland, cervicomedullary junction, and visualized cervical spine. Normal bone marrow signal.  New mild right mastoid effusion. Rightward gaze deviation. Stable paranasal sinuses. Visualized scalp soft tissues are within normal limits.  MRA HEAD FINDINGS  Stable antegrade flow in the posterior circulation with dominant distal right vertebral artery. Widespread basilar artery irregularity does appear progressed along with mild multifocal basilar artery stenosis. The moderate stenosis just proximal to the SCA origins appears stable.  Widespread bilateral PCA irregularity with high-grade distal P2 stenoses appears stable. Posterior communicating arteries are diminutive or absent.  Stable antegrade flow in the left ICA siphon. No left ICA stenosis identified. Left carotid terminus MCA, and left ACA origins remain within normal limits. Increased irregularity of the visualized left ACA branches, but with Stable distal left ACA flow. Moderate irregularity of the left MCA branches is stable, with stable distal left MCA flow.  Stable antegrade flow in the right ICA siphon but increased irregularity in the vertical P each wrists segment now resulting in mild stenosis. Stable right ICA terminus infundibulum. Patent right ICA terminus with stable right MCA and ACA origins.  New high-grade irregularity and attenuation of flow signal in the low right ACA distal A2 segment, with poor distal right ACA flow signal.  Proximal right M2 branch irregularity and stenosis appears not significantly changed since 2014. Right MCA branch flow signal appears stable, with no interval major right MCA branch occlusion identified.  IMPRESSION: 1. Patchy acute infarcts in the right ACA / MCA watershed territory involving the superior  frontal gyrus, mostly in the pre motor area. No mass effect or hemorrhage. 2. New distal right ACA occlusion and high-grade irregularity of the A2 segment, concordant with #1. Right MCA M2 branch atherosclerosis and flow appears stable since 2014. 3. Interval mild progression of right ICA siphon and basilar artery atherosclerosis, but no other new or progressed hemodynamically significant stenosis compared to the 2014 MRA.  Electronically Signed: By: Lars Pinks M.D. On: 12/17/2013 15:18    CBC  Recent Labs Lab 12/17/13 1034 12/18/13 0509  WBC 8.7 11.7*  HGB 13.5 13.1  HCT 40.5 38.7  PLT 908* 929*  MCV 80.7 81.0  MCH 26.9 27.4  MCHC 33.3 33.9  RDW 16.5* 16.2*  LYMPHSABS 2.2  --  MONOABS 0.9  --   EOSABS 0.0  --   BASOSABS 0.0  --     Chemistries   Recent Labs Lab 12/17/13 1034 12/18/13 0509  NA 143 138  K 3.6* 3.8  CL 105 101  CO2 23 19  GLUCOSE 96 143*  BUN 11 10  CREATININE 0.46* 0.42*  CALCIUM 9.8 9.7  AST 13 14  ALT 7 5  ALKPHOS 59 61  BILITOT 0.7 0.8   ------------------------------------------------------------------------------------------------------------------ estimated creatinine clearance is 42.4 ml/min (by C-G formula based on Cr of 0.42). ------------------------------------------------------------------------------------------------------------------  Recent Labs  12/17/13 1034  HGBA1C 5.5   ------------------------------------------------------------------------------------------------------------------  Recent Labs  12/18/13 0509  CHOL 170  HDL 55  LDLCALC 104*  TRIG 57  CHOLHDL 3.1   ------------------------------------------------------------------------------------------------------------------ No results found for this basename: TSH, T4TOTAL, FREET3, T3FREE, THYROIDAB,  in the last 72 hours ------------------------------------------------------------------------------------------------------------------ No results found for this  basename: VITAMINB12, FOLATE, FERRITIN, TIBC, IRON, RETICCTPCT,  in the last 72 hours  Coagulation profile No results found for this basename: INR, PROTIME,  in the last 168 hours  No results found for this basename: DDIMER,  in the last 72 hours  Cardiac Enzymes  Recent Labs Lab 12/17/13 1034  TROPONINI <0.30   ------------------------------------------------------------------------------------------------------------------ No components found with this basename: POCBNP,     Hawley Pavia D.O. on 12/18/2013 at 9:57 AM  Between 7am to 7pm - Pager - [phone]  After 7pm go to [email or web address] - password [email or web address]  And look for the night coverage person covering for me after hours  [facility] Office  [phone]

## 2013-12-18 NOTE — Evaluation (Signed)
Clinical/Bedside Swallow Evaluation Patient Details  Name: Cristina Patton MRN: [id number] Date of Birth: 02/04/1927  Today's Date: 12/18/2013 Time: 0805-0830 SLP Time Calculation (min): 25 min  Past Medical History:  Past Medical History  Diagnosis Date  . Hypertension   . CVA (cerebral infarction)   . Stroke    Past Surgical History: History reviewed. No pertinent past surgical history. HPI:  80 yo female adm to [facility] with AMS, left hemiparesis.  Per MRI 8/17 pt with patchy acute infarcts in the right ACA / MCA watershed territory involving the superior frontal gyrus, mostly in the pre motor area. No mass effect or hemorrhage.  Pt also with LEFT upper lobe infiltrate and mild bibasilar atelectasis per CXR 8/17.  Swallow and speech evaluation ordered.     Assessment / Plan / Recommendation Clinical Impression  Pt presents with suspected cognitive/neuro based dysphagia exacerbated by current lethargy.   Pt without attempt to orally manipulate boluses requring oral suctioning from SLP.  Anterior labial spillage of thin liquid noted *from right labia* without awareness.  Pt did verbally responding to SLP approximately 50% of the time *mostly echolalic responses.  She inconsistently followed one step commands - 20% of time.  When instructed to smile, pt stated "why should I?" Speech is clear when pt articulates.  Hopeful for pt to be able to have a po diet when she becomes consistently alert.    Recommend strict NPO and reeval next date.     Aspiration Risk  Severe    Diet Recommendation NPO        Other  Recommendations   tbd  Follow Up Recommendations    tbd   Frequency and Duration min 2x/week  2 weeks   Pertinent Vitals/Pain Low grade fever, decreased      Swallow Study Prior Functional Status   ? If pt resides alone or with family, h/o previous CVA causing left hemiparesis November 2014    General Date of Onset: 12/18/13 HPI: 80 yo female adm to [facility] with AMS, left  hemiparesis.  Per MRI 8/17 pt with patchy acute infarcts in the right ACA / MCA watershed territory involving the superior frontal gyrus, mostly in the pre motor area. No mass effect or hemorrhage.  Pt also with LEFT upper lobe infiltrate and mild bibasilar atelectasis per CXR 8/17.  Swallow and speech evaluation ordered.   Type of Study: Bedside swallow evaluation Diet Prior to this Study: NPO Temperature Spikes Noted: No Respiratory Status: Room air History of Recent Intubation: No Behavior/Cognition: Decreased sustained attention;Doesn't follow directions;Requires cueing;Lethargic;Distractible Oral Cavity - Dentition: Adequate natural dentition Self-Feeding Abilities: Total assist Patient Positioning: Upright in bed Baseline Vocal Quality: Clear Volitional Cough: Cognitively unable to elicit Volitional Swallow: Unable to elicit    Oral/Motor/Sensory Function Overall Oral Motor/Sensory Function:  (pt did not consistently following commands) Lingual ROM: Reduced left Lingual Symmetry: Abnormal symmetry left Facial ROM: Reduced left Facial Symmetry: Left droop Velum:  (pt did not open mouth adequately to assess)   Ice Chips Ice chips: Not tested   Thin Liquid Thin Liquid: Impaired Presentation: Spoon;Straw;Cup Oral Phase Impairments: Reduced lingual movement/coordination;Impaired anterior to posterior transit;Poor awareness of bolus;Reduced labial seal Oral Phase Functional Implications: Oral holding    Nectar Thick Nectar Thick Liquid: Not tested   Honey Thick Honey Thick Liquid: Not tested   Puree Puree: Impaired Presentation: Spoon;Self Fed Oral Phase Impairments: Reduced labial seal;Impaired anterior to posterior transit Oral Phase Functional Implications: Oral holding   Solid   GO  Solid: Not tested       Claudie Fisherman,  [facility] SLP [phone]

## 2013-12-18 NOTE — Evaluation (Signed)
Physical Therapy Evaluation Patient Details Name: TYJA GORTNEY MRN: [id number] DOB: 07/14/1926 Today's Date: 12/18/2013   History of Present Illness  Pt admitted with AMS.  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 Pt is difficult to arouse presently.  Work up is proceeding,.  Clinical Impression  Pt admitted with/for AMS and s/s of new stroke.  Pt currently limited functionally due to the problems listed. ( See problems list.)   Pt will benefit from PT to maximize function and safety in order to get ready for next venue listed below.     Follow Up Recommendations SNF    Equipment Recommendations  Other (comment) (TBA)    Recommendations for Other Services       Precautions / Restrictions Precautions Precautions: Fall      Mobility  Bed Mobility Overal bed mobility: Needs Assistance;+2 for physical assistance;+ 2 for safety/equipment Bed Mobility: Rolling;Sidelying to Sit Rolling: Total assist;+2 for safety/equipment Sidelying to sit: Total assist;+2 for safety/equipment;+2 for physical assistance       General bed mobility comments: Pt laying in a semi rigid fetal posture and did not assist roll to L.  Nor did she assist with sit up to EOB  Transfers Overall transfer level: Needs assistance   Transfers: Sit to/from [employer] Transfers Sit to Stand: Total assist;+2 physical assistance   Squat pivot transfers: Total assist;+2 physical assistance     General transfer comment: Guided pt with multimodal command; no voluntary assist noted.  Ambulation/Gait             General Gait Details: not able  Stairs            Wheelchair Mobility    Modified Rankin (Stroke Patients Only) Modified Rankin (Stroke Patients Only) Pre-Morbid Rankin Score: Moderately severe disability Modified Rankin: Severe disability     Balance Overall balance assessment: Needs assistance Sitting-balance support: Feet supported;Feet  unsupported;No upper extremity supported Sitting balance-Leahy Scale: Poor Sitting balance - Comments: sat EOB times >10 min working on arousal, activation of trunk/sitting balance.  Back/abdominals automatically resisting forward and backward perturbation, but pt not acitvely trying to correct for balance.  Response to attempts to startle produced no different response.   Standing balance support: Bilateral upper extremity supported Standing balance-Leahy Scale: Zero Standing balance comment: pt did not attempt to assist in standing times 2                             Pertinent Vitals/Pain Pain Assessment: No/denies pain    Home Living Family/patient expects to be discharged to:: Skilled nursing facility                 Additional Comments: Pt resides at [facility]    Prior Function Level of Independence: Needs assistance               Hand Dominance   Dominant Hand: Right    Extremity/Trunk Assessment   Upper Extremity Assessment: Defer to OT evaluation (on eval no spontaneous or voluntary L UE movement)           Lower Extremity Assessment: RLE deficits/detail;LLE deficits/detail;Difficult to assess due to impaired cognition RLE Deficits / Details: Repetitively kicking R LE out in a 50-90* flexion arc after tactilly cued, but not to command LLE Deficits / Details: noted no spontaneous or voluntary movement L LE     Communication   Communication: Other (comment) (speech is intelligible when she  does talk)  Cognition Arousal/Alertness: Lethargic (Eyes open 1% of time) Behavior During Therapy: Flat affect (not aroused) Overall Cognitive Status: No family/caregiver present to determine baseline cognitive functioning                      General Comments      Exercises        Assessment/Plan    PT Assessment Patient needs continued PT services  PT Diagnosis Other (comment) (hemiparesis)   PT Problem List Decreased  strength;Decreased activity tolerance;Decreased balance;Decreased mobility;Decreased coordination;Decreased safety awareness;Decreased cognition  PT Treatment Interventions     PT Goals (Current goals can be found in the Care Plan section) Acute Rehab PT Goals PT Goal Formulation: Patient unable to participate in goal setting Time For Goal Achievement: 01/01/14 Potential to Achieve Goals: Good    Frequency     Barriers to discharge        Co-evaluation               End of Session   Activity Tolerance: Patient tolerated treatment well;Patient limited by lethargy Patient left: in chair;with call bell/phone within reach Nurse Communication: Mobility status         Time: 1130-1155 PT Time Calculation (min): 25 min   Charges:   PT Evaluation $Initial PT Evaluation Tier I: 1 Procedure PT Treatments $Therapeutic Activity: 8-22 mins   PT G Codes:          Dezmond Downie, Tessie Fass 12/18/2013, 12:25 PM 12/18/2013  Donnella Sham, PT [phone]  (pager)

## 2013-12-19 DIAGNOSIS — I379 Nonrheumatic pulmonary valve disorder, unspecified: Secondary | ICD-10-CM

## 2013-12-19 DIAGNOSIS — I6789 Other cerebrovascular disease: Secondary | ICD-10-CM

## 2013-12-19 LAB — LEGIONELLA ANTIGEN, URINE: Legionella Antigen, Urine: NEGATIVE

## 2013-12-19 LAB — BASIC METABOLIC PANEL
Anion gap: 14 (ref 5–15)
BUN: 11 mg/dL (ref 6–23)
CO2: 22 mEq/L (ref 19–32)
Calcium: 9.7 mg/dL (ref 8.4–10.5)
Chloride: 103 mEq/L (ref 96–112)
Creatinine, Ser: 0.5 mg/dL (ref 0.50–1.10)
GFR calc Af Amer: >90 mL/min (ref >90)
GFR, EST NON AFRICAN AMERICAN: 86 mL/min — AB (ref >90)
GLUCOSE: 101 mg/dL — AB (ref 70–99)
Potassium: 3.8 mEq/L (ref 3.7–5.3)
SODIUM: 139 meq/L (ref 137–147)

## 2013-12-19 LAB — GLUCOSE, CAPILLARY
GLUCOSE-CAPILLARY: 106 mg/dL — AB (ref 70–99)
GLUCOSE-CAPILLARY: 103 mg/dL — AB (ref 70–99)
GLUCOSE-CAPILLARY: 109 mg/dL — AB (ref 70–99)
Glucose-Capillary: 84 mg/dL (ref 70–99)
Glucose-Capillary: 140 mg/dL — ABNORMAL HIGH (ref 70–99)

## 2013-12-19 LAB — CBC
HCT: 36.5 % (ref 36.0–46.0)
HEMOGLOBIN: 12.2 g/dL (ref 12.0–15.0)
MCH: 27.2 pg (ref 26.0–34.0)
MCHC: 33.4 g/dL (ref 30.0–36.0)
MCV: 81.3 fL (ref 78.0–100.0)
Platelets: 853 10*3/uL — ABNORMAL HIGH (ref 150–400)
RBC: 4.49 MIL/uL (ref 3.87–5.11)
RDW: 16.6 % — ABNORMAL HIGH (ref 11.5–15.5)
WBC: 9.4 10*3/uL (ref 4.0–10.5)

## 2013-12-19 MED ORDER — CLOPIDOGREL BISULFATE 75 MG PO TABS
75.0000 mg | ORAL_TABLET | Freq: Every day | ORAL | Status: DC
  Administered 2013-12-20 – 2013-12-22 (×3): 75 mg via ORAL
  Filled 2013-12-19 (×3): qty 1

## 2013-12-19 NOTE — Evaluation (Signed)
Occupational Therapy Evaluation Patient Details Name: Cristina Patton MRN: [id number] DOB: 02-Jul-1926 Today's Date: 12/19/2013    History of Present Illness Pt admitted with AMS.  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 Pt is difficult to arouse presently.  Work up is proceeding,.   Clinical Impression   PT admitted with new R ACA/ MCA. Pt currently with functional limitiations due to the deficits listed below (see OT problem list). PTA pt is admitted from home. Pt reports "i have 3 assistance and a w/c". OT called [facility] and pt was discharged home in Dec 2014. Pt will benefit from skilled OT to increase their independence and safety with adls and balance to allow discharge SNF. OT to follow acutely for adl retraining and static sitting balance.     Follow Up Recommendations  SNF;Supervision/Assistance - 24 hour    Equipment Recommendations  Wheelchair cushion (measurements OT);Wheelchair (measurements OT);Hospital bed;Other (comment) (defer SNF)    Recommendations for Other Services       Precautions / Restrictions Precautions Precautions: Fall      Mobility Bed Mobility Overal bed mobility: +2 for physical assistance;Needs Assistance Bed Mobility: Rolling;Sidelying to Sit;Supine to Sit Rolling: +2 for physical assistance;Total assist Sidelying to sit: +2 for physical assistance;Total assist Supine to sit: +2 for physical assistance;Total assist     General bed mobility comments: pt did initiate Rt LE movement toward Lt side of the bed. Pt required (A) of BIL LE to achieve EOB sitting   Transfers Overall transfer level: Needs assistance Equipment used: 2 person hand held assist Transfers: Stand Pivot Transfers;Sit to/from Stand Sit to Stand: +2 physical assistance;Total assist Stand pivot transfers: +2 physical assistance;Max assist       General transfer comment: Pt required v/c for upright posture. pt with very flexed posture due  to posterior pelvic tilt. Pt unable to achieve full upright posture due to kyphotic posture.     Balance Overall balance assessment: Needs assistance Sitting-balance support: Bilateral upper extremity supported;Feet supported Sitting balance-Leahy Scale: Fair Sitting balance - Comments: Pt sitting eob for 5 minutes with BIL UE support. pt with forward flexion due to posture pelvic tilt   Standing balance support: Bilateral upper extremity supported;During functional activity Standing balance-Leahy Scale: Zero                              ADL Overall ADL's : Needs assistance/impaired Eating/Feeding: NPO   Grooming: Maximal assistance;Sitting                   Toilet Transfer: +2 for physical assistance;Total assistance;Stand-pivot;BSC Toilet Transfer Details (indicate cue type and reason): (A) for balance static sitting on BSC Toileting- Clothing Manipulation and Hygiene: Total assistance;+2 for physical assistance;+2 for safety/equipment         General ADL Comments: Pt supine on arrival and reports "good morning " back to therapist. pt visually attending to therapist. Pt very pleasant . pt incontinent of bladder supine and unaware. Pt requries 30 second delay to all commands. pt needing statements repeated so question cognition v/s hearing loss? Pt states "thank you for working with me" at end of session in chair.      Vision                 Additional Comments: difficulty to assess due to delay response to commands. Pt visually tracking therapist in room and staring at TV screen. to be  further assessed   Perception Perception Perception Tested?: Yes Perception Deficits: Inattention/neglect Inattention/Neglect: Does not attend to left side of body   Praxis Praxis Praxis tested?: Deficits Deficits: Ideomotor    Pertinent Vitals/Pain Pain Assessment: No/denies pain     Hand Dominance Right   Extremity/Trunk Assessment Upper Extremity  Assessment Upper Extremity Assessment: LUE deficits/detail LUE Deficits / Details: AROM noted, shoulder flexion 2+ out 5, grasp 2+ out 5, Pt with cognitive deficits and requires extended time and effort to respond to command. Pt resisting therapist with elbow PROM LUE Coordination: decreased gross motor;decreased fine motor   Lower Extremity Assessment Lower Extremity Assessment: Defer to PT evaluation;LLE deficits/detail LLE Deficits / Details: noted movement with BED and able to weight bear this session   Cervical / Trunk Assessment Cervical / Trunk Assessment: Kyphotic   Communication Communication Communication: Expressive difficulties (requires ~ 30 second delay to respond)   Cognition Arousal/Alertness: Awake/alert Behavior During Therapy: WFL for tasks assessed/performed Overall Cognitive Status: No family/caregiver present to determine baseline cognitive functioning       Memory:  (able to provide DOB correctly)             General Comments       Exercises       Shoulder Instructions      Home Living Family/patient expects to be discharged to:: Skilled nursing facility                                 Additional Comments: pt admiitted from home. [facility] and d/c in December 2014      Prior Functioning/Environment Level of Independence: Needs assistance             OT Diagnosis: Generalized weakness;Cognitive deficits;Hemiplegia non-dominant side   OT Problem List: Decreased strength;Decreased activity tolerance;Impaired balance (sitting and/or standing);Impaired vision/perception;Decreased coordination;Decreased cognition;Decreased safety awareness;Decreased knowledge of use of DME or AE;Decreased knowledge of precautions;Cardiopulmonary status limiting activity;Impaired UE functional use;Increased edema;Decreased range of motion   OT Treatment/Interventions: Self-care/ADL training;Therapeutic exercise;Neuromuscular  education;DME and/or AE instruction;Therapeutic activities;Cognitive remediation/compensation;Visual/perceptual remediation/compensation;Patient/family education;Balance training    OT Goals(Current goals can be found in the care plan section) Acute Rehab OT Goals OT Goal Formulation: Patient unable to participate in goal setting Time For Goal Achievement: 01/02/14 Potential to Achieve Goals: Good ADL Goals Pt Will Perform Eating: with mod assist;sitting Pt Will Perform Grooming: with mod assist;sitting Pt Will Perform Upper Body Bathing: with mod assist;sitting Pt Will Transfer to Toilet: bedside commode;with +2 assist;with mod assist Additional ADL Goal #1: pt will demonstrate total + 2 min (A) with bed mobility HOB elevated  OT Frequency: Min 2X/week   Barriers to D/C:            Co-evaluation              End of Session Equipment Utilized During Treatment: Gait belt Nurse Communication: Need for lift equipment;Mobility status;Precautions  Activity Tolerance: Patient tolerated treatment well Patient left: in chair;with call bell/phone within reach;with chair alarm set   Time: [phone] OT Time Calculation (min): 17 min Charges:  OT General Charges $OT Visit: 1 Procedure OT Evaluation $Initial OT Evaluation Tier I: 1 Procedure OT Treatments $Self Care/Home Management : 8-22 mins G-Codes:    Parke Poisson B December 30, 2013, 10:35 AM Pager: [phone]

## 2013-12-19 NOTE — Progress Notes (Signed)
  Echocardiogram 2D Echocardiogram has been performed.  Cristina Patton 12/19/2013, 2:59 PM

## 2013-12-19 NOTE — Progress Notes (Signed)
Speech Language Pathology Treatment: Dysphagia  Patient Details Name: Cristina Patton MRN: [id number] DOB: 1927/04/13 Today's Date: 12/19/2013 Time: 5638-9373 SLP Time Calculation (min): 20 min  Assessment / Plan / Recommendation Clinical Impression  Pt more appropriate to determine po readiness today. Pt was seated in chair, leaning hard to the left. SLP assisted pt to reposition upright, however, pt was unable to maintain this posture unaided. Standing secretions were noted in oral cavity. When asked if she was hungry, pt responded "I need to expectorate". Oral suction was used to remove significant amount of saliva from pt mouth. Pt was then given trials of puree and thin liquid. Anterior leakage noted, largely due to forward position. No overt s/s aspiration or clinical indication of airway compromise. Will begin puree diet with thin liquids, crushed meds, position as upright as tolerated. ST to follow for assessment of diet tolerance.   HPI HPI: 80 yo female adm to [facility] with AMS, left hemiparesis.  Per MRI 8/17 pt with patchy acute infarcts in the right ACA / MCA watershed territory involving the superior frontal gyrus, mostly in the pre motor area. No mass effect or hemorrhage.  Pt also with LEFT upper lobe infiltrate and mild bibasilar atelectasis per CXR 8/17.  Swallow evaluation completed 8/18, with rec for NPO due to lethargy.  SLP returned 8/19 for assesment of po readiness   Pertinent Vitals Pain Assessment: No/denies pain  SLP Plan    assess diet tolerance, advance as pt tolerates   Recommendations Diet recommendations: Dysphagia 1 (puree);Thin liquid                   GO   Pleas Carneal B. Quentin [facility], CCC-SLP [phone]  Shonna Chock 12/19/2013, 2:53 PM

## 2013-12-19 NOTE — Progress Notes (Signed)
[facility] PROGRESS NOTE  Cristina Patton WPY:[id number] DOB: 01/17/1927 DOA: 12/17/2013 PCP: PROVIDER NOT IN SYSTEM  Assessment/Plan: 80 year old female with history of stroke hypertension that is poorly controlled, ischemic strokes with residual left hemiparesis Nov 2014 presented to the emergency department with increasing weakness over the last few days and found to have acute CVA  -Patient also has community-acquired pneumonia versus aspiration pneumonia.    1. Acute CVA, L sided hemiparesis  -MRI of the brain:Patchy acute infarcts in the right ACA / MCA watershed territory involving the superior frontal gyrus, mostly in the pre motor area  -MRA of the head: New distal right ACA occlusion and high-grade irregularity of the A2 segment,  -Pending carotid Doppler, echocardiogram  -Hemoglobin A1c 5.5, LDL 104  -PT and OT consults  -Speech evaluated patient currently n.p.o.  -Continue rectal aspirin  -Neurology consulted appreciated, recommended aspirin and Plavix for 3 weeks, thereafter Plavix alone  -Patient will likely need nursing home placement, currently lives at home alone.   2. Accelerated hypertension  -Allowing permissive hypertension with hydralazine as needed, resume amlodipine in AM  3. Community acquired pneumonia versus possible aspiration pneumonia  -Patient currently on ceftriaxone and azithromycin  -Continue oxygen as needed to maintain saturations above 92%  4. Thrombocytosis  Possibly acute phase reactant -Will likely require workup once she improves clinically  -Patient has no history of smoking  -Blood smear pending  5. Recent right inguinal hernia  -Diagnosed in May of 2015  -Surgery was offered at that time, patient declined.  -Appears stable   D/w patient, confirmed;  -Pt is DNR  Code Status: DNR Family Communication: d/w patient (indicate person spoken with, relationship, and if by phone, the number) Disposition Plan:  SNF   Consultants:  Neurology   Procedures:  none  Antibiotics:  Ceftriaxone azith 817<<<<   (indicate start date, and stop date if known)  HPI/Subjective: alert  Objective: Filed Vitals:   12/19/13 1005  BP: 166/69  Pulse: 93  Temp: 98.1 F (36.7 C)  Resp: 20    Intake/Output Summary (Last 24 hours) at 12/19/13 1131 Last data filed at 12/19/13 0800  Gross per 24 hour  Intake      0 ml  Output      0 ml  Net      0 ml   Filed Weights   12/17/13 1604  Weight: 52.2 kg (115 lb 1.3 oz)    Exam:   General:  alert  Cardiovascular: s1,s2 rrr  Respiratory: CTA BL  Abdomen: soft, nt,nd   Musculoskeletal: no LE edema   Data Reviewed: Basic Metabolic Panel:  Recent Labs Lab 12/17/13 1034 12/18/13 0509 12/19/13 0545  NA 143 138 139  K 3.6* 3.8 3.8  CL 105 101 103  CO2 23 19 22   GLUCOSE 96 143* 101*  BUN 11 10 11   CREATININE 0.46* 0.42* 0.50  CALCIUM 9.8 9.7 9.7   Liver Function Tests:  Recent Labs Lab 12/17/13 1034 12/18/13 0509  AST 13 14  ALT 7 5  ALKPHOS 59 61  BILITOT 0.7 0.8  PROT 7.9 7.8  ALBUMIN 3.3* 3.3*   No results found for this basename: LIPASE, AMYLASE,  in the last 168 hours No results found for this basename: AMMONIA,  in the last 168 hours CBC:  Recent Labs Lab 12/17/13 1034 12/18/13 0509 12/19/13 0545  WBC 8.7 11.7* 9.4  NEUTROABS 5.7  --   --   HGB 13.5 13.1 12.2  HCT 40.5 38.7 36.5  MCV 80.7 81.0 81.3  PLT 908* 929* 853*   Cardiac Enzymes:  Recent Labs Lab 12/17/13 1034  TROPONINI <0.30   BNP (last 3 results) No results found for this basename: PROBNP,  in the last 8760 hours CBG:  Recent Labs Lab 12/18/13 1134 12/18/13 1701 12/18/13 2244 12/19/13 0420 12/19/13 0818  GLUCAP 123* 113* 110* 106* 103*    No results found for this or any previous visit (from the past 240 hour(s)).   Studies: [name] KG Contrast  12/17/2013   ADDENDUM REPORT: 12/17/2013 15:25  ADDENDUM: Study discussed  by telephone with Dr. Carmin Muskrat on 12/17/2013 at 15:20 hours.   Electronically Signed   By: Lars Pinks M.D.   On: 12/17/2013 15:25   12/17/2013   CLINICAL DATA:  80 year old female with increasing weakness and confusion. Initial encounter. History of small right inferior cerebellar infarct in 2014.  EXAM: MRI HEAD WITHOUT CONTRAST  MRA HEAD WITHOUT CONTRAST  TECHNIQUE: Multiplanar, multiecho pulse sequences of the brain and surrounding structures were obtained without intravenous contrast. Angiographic images of the head were obtained using MRA technique without contrast.  COMPARISON:  Head CT without contrast 1050 hr today. Brain MRI and MRA 03/22/2013.  FINDINGS: MRI HEAD FINDINGS  Patchy clustered cortical and white matter restricted diffusion in the right superior frontal gyrus, mostly the pre motor area (see series 5, images 23-28).  No left hemisphere or posterior fossa restricted diffusion identified.  Major intracranial vascular flow voids are stable.  Mild if any associated T2 and FLAIR hyperintensity to correspond with the acute diffusion abnormality. Underlying Patchy and confluent white matter T2 and FLAIR signal changes are stable. No acute intracranial hemorrhage identified. No midline shift, mass effect, or evidence of intracranial mass lesion. No ventriculomegaly. Stable pituitary gland, cervicomedullary junction, and visualized cervical spine. Normal bone marrow signal.  New mild right mastoid effusion. Rightward gaze deviation. Stable paranasal sinuses. Visualized scalp soft tissues are within normal limits.  MRA HEAD FINDINGS  Stable antegrade flow in the posterior circulation with dominant distal right vertebral artery. Widespread basilar artery irregularity does appear progressed along with mild multifocal basilar artery stenosis. The moderate stenosis just proximal to the SCA origins appears stable.  Widespread bilateral PCA irregularity with high-grade distal P2 stenoses appears stable.  Posterior communicating arteries are diminutive or absent.  Stable antegrade flow in the left ICA siphon. No left ICA stenosis identified. Left carotid terminus MCA, and left ACA origins remain within normal limits. Increased irregularity of the visualized left ACA branches, but with Stable distal left ACA flow. Moderate irregularity of the left MCA branches is stable, with stable distal left MCA flow.  Stable antegrade flow in the right ICA siphon but increased irregularity in the vertical P each wrists segment now resulting in mild stenosis. Stable right ICA terminus infundibulum. Patent right ICA terminus with stable right MCA and ACA origins.  New high-grade irregularity and attenuation of flow signal in the low right ACA distal A2 segment, with poor distal right ACA flow signal.  Proximal right M2 branch irregularity and stenosis appears not significantly changed since 2014. Right MCA branch flow signal appears stable, with no interval major right MCA branch occlusion identified.  IMPRESSION: 1. Patchy acute infarcts in the right ACA / MCA watershed territory involving the superior frontal gyrus, mostly in the pre motor area. No mass effect or hemorrhage. 2. New distal right ACA occlusion and high-grade irregularity of the A2 segment, concordant with #1. Right MCA M2 branch  atherosclerosis and flow appears stable since 2014. 3. Interval mild progression of right ICA siphon and basilar artery atherosclerosis, but no other new or progressed hemodynamically significant stenosis compared to the 2014 MRA.  Electronically Signed: By: Lars Pinks M.D. On: 12/17/2013 15:18   Mr Brain Wo Contrast  12/17/2013   ADDENDUM REPORT: 12/17/2013 15:25  ADDENDUM: Study discussed by telephone with Dr. Carmin Muskrat on 12/17/2013 at 15:20 hours.   Electronically Signed   By: Lars Pinks M.D.   On: 12/17/2013 15:25   12/17/2013   CLINICAL DATA:  80 year old female with increasing weakness and confusion. Initial encounter. History  of small right inferior cerebellar infarct in 2014.  EXAM: MRI HEAD WITHOUT CONTRAST  MRA HEAD WITHOUT CONTRAST  TECHNIQUE: Multiplanar, multiecho pulse sequences of the brain and surrounding structures were obtained without intravenous contrast. Angiographic images of the head were obtained using MRA technique without contrast.  COMPARISON:  Head CT without contrast 1050 hr today. Brain MRI and MRA 03/22/2013.  FINDINGS: MRI HEAD FINDINGS  Patchy clustered cortical and white matter restricted diffusion in the right superior frontal gyrus, mostly the pre motor area (see series 5, images 23-28).  No left hemisphere or posterior fossa restricted diffusion identified.  Major intracranial vascular flow voids are stable.  Mild if any associated T2 and FLAIR hyperintensity to correspond with the acute diffusion abnormality. Underlying Patchy and confluent white matter T2 and FLAIR signal changes are stable. No acute intracranial hemorrhage identified. No midline shift, mass effect, or evidence of intracranial mass lesion. No ventriculomegaly. Stable pituitary gland, cervicomedullary junction, and visualized cervical spine. Normal bone marrow signal.  New mild right mastoid effusion. Rightward gaze deviation. Stable paranasal sinuses. Visualized scalp soft tissues are within normal limits.  MRA HEAD FINDINGS  Stable antegrade flow in the posterior circulation with dominant distal right vertebral artery. Widespread basilar artery irregularity does appear progressed along with mild multifocal basilar artery stenosis. The moderate stenosis just proximal to the SCA origins appears stable.  Widespread bilateral PCA irregularity with high-grade distal P2 stenoses appears stable. Posterior communicating arteries are diminutive or absent.  Stable antegrade flow in the left ICA siphon. No left ICA stenosis identified. Left carotid terminus MCA, and left ACA origins remain within normal limits. Increased irregularity of the  visualized left ACA branches, but with Stable distal left ACA flow. Moderate irregularity of the left MCA branches is stable, with stable distal left MCA flow.  Stable antegrade flow in the right ICA siphon but increased irregularity in the vertical P each wrists segment now resulting in mild stenosis. Stable right ICA terminus infundibulum. Patent right ICA terminus with stable right MCA and ACA origins.  New high-grade irregularity and attenuation of flow signal in the low right ACA distal A2 segment, with poor distal right ACA flow signal.  Proximal right M2 branch irregularity and stenosis appears not significantly changed since 2014. Right MCA branch flow signal appears stable, with no interval major right MCA branch occlusion identified.  IMPRESSION: 1. Patchy acute infarcts in the right ACA / MCA watershed territory involving the superior frontal gyrus, mostly in the pre motor area. No mass effect or hemorrhage. 2. New distal right ACA occlusion and high-grade irregularity of the A2 segment, concordant with #1. Right MCA M2 branch atherosclerosis and flow appears stable since 2014. 3. Interval mild progression of right ICA siphon and basilar artery atherosclerosis, but no other new or progressed hemodynamically significant stenosis compared to the 2014 MRA.  Electronically Signed: By: Truman Hayward  [place] Crane M.D. On: 12/17/2013 15:18    Scheduled Meds: .  stroke: mapping our early stages of recovery book   Does not apply Once  . aspirin  325 mg Oral Daily   Or  . aspirin  300 mg Rectal Daily  . azithromycin  500 mg Intravenous Q24H  . cefTRIAXone (ROCEPHIN)  IV  1 g Intravenous Q24H  . enoxaparin (LOVENOX) injection  40 mg Subcutaneous Q24H   Continuous Infusions:   Principal Problem:   Acute CVA (cerebrovascular accident) Active Problems:   Dehydration   Acute encephalopathy   CAP (community acquired pneumonia)   History of stroke   Accelerated hypertension   Left hemiparesis    Thrombocythemia    Time spent: >35 minutes     Kinnie Feil  [facility] Pager [phone]. If 7PM-7AM, please contact night-coverage at [email or web address], password [facility] 12/19/2013, 11:31 AM  LOS: 2 days

## 2013-12-19 NOTE — Evaluation (Signed)
Speech Language Pathology Evaluation Patient Details Name: Cristina Patton MRN: [id number] DOB: [birth year] Today's Date: 12/19/2013 Time: 3086-5784 SLP Time Calculation (min): 25 min  Problem List:  Patient Active Problem List   Diagnosis Date Noted  . Acute CVA (cerebrovascular accident) 12/18/2013  . CAP (community acquired pneumonia) 12/17/2013  . History of stroke 12/17/2013  . Accelerated hypertension 12/17/2013  . Left hemiparesis 12/17/2013  . Thrombocythemia 12/17/2013  . Unspecified constipation 06/18/2013  . Urinary retention 05/10/2013  . UTI (urinary tract infection) 04/03/2013  . GI bleed 04/02/2013  . Acute blood loss anemia 04/02/2013  . CVA (cerebral infarction) 04/02/2013  . Protein-calorie malnutrition, severe 03/21/2013  . Dehydration 03/20/2013  . Acute renal failure 03/20/2013  . Leukocytosis, unspecified 03/20/2013  . Acute encephalopathy 03/20/2013  . Fall at home- ? syncope. 03/20/2013  . Hypernatremia 03/20/2013  . HTN (hypertension) 03/20/2013  . Hypothermia 03/20/2013  . Transaminitis 03/20/2013   Past Medical History:  Past Medical History  Diagnosis Date  . Hypertension   . CVA (cerebral infarction)   . Stroke    Past Surgical History: History reviewed. No pertinent past surgical history. HPI:  80 yo female adm to [facility] with AMS, left hemiparesis.  Per MRI 8/17 pt with patchy acute infarcts in the right ACA / MCA watershed territory involving the superior frontal gyrus, mostly in the pre motor area. No mass effect or hemorrhage.  Pt also with LEFT upper lobe infiltrate and mild bibasilar atelectasis per CXR 8/17.  SLP present today to complete com/cog assessment   Assessment / Plan / Recommendation Clinical Impression  Pt was unable to complete formal cognitive assessment. Uncertain if pt has difficulty hearing or cognitively requires additional processing time. Pt demonstrates the ability to answer yes/no questions and follow simple verbal  commands. She is able to produce coherent and intelligible sentences, but at times does not respond even after 90 seconds.  Abstract reasoning tasks were accurate, however, pt was unable to demonstrate immediate recall of unrelated words.  She is oriented to person, but unable to verbalize year, place, or situation. Pt indicated she holds 2 masters degress in physical education, but could not provide information regarding her line of work.  Further diagnostic testing is needed, however, ST will continue treatment for remediation of cognitive skills and effective communication.    SLP Assessment  Patient needs continued Speech Lanaguage Pathology Services    Follow Up Recommendations  24 hour supervision/assistance;Skilled Nursing facility    Frequency and Duration min 2x/week  2 weeks   Pertinent Vitals/Pain Pain Assessment: No/denies pain   SLP Goals  SLP Goals Potential to Achieve Goals: Fair Potential Considerations: Co-morbidities;Cooperation/participation level  SLP Evaluation Prior Functioning  Cognitive/Linguistic Baseline: Information not available Education: 2 masters degrees Vocation: Retired   Associate Professor  Overall Cognitive Status: No family/caregiver present to determine baseline cognitive functioning Arousal/Alertness: Awake/alert Orientation Level: Oriented to person Attention: Focused;Sustained Focused Attention: Impaired Focused Attention Impairment: Verbal basic Sustained Attention: Impaired Sustained Attention Impairment: Verbal basic Memory: Impaired Memory Impairment: Storage deficit;Retrieval deficit;Decreased recall of new information;Decreased short term memory Decreased Short Term Memory: Verbal basic Awareness: Impaired Problem Solving: Impaired Problem Solving Impairment: Verbal basic Safety/Judgment: Impaired    Comprehension  Auditory Comprehension Overall Auditory Comprehension: Appears within functional limits for tasks assessed Visual  Recognition/Discrimination Discrimination: Not tested Reading Comprehension Reading Status: Not tested    Expression Expression Primary Mode of Expression: Verbal Verbal Expression Overall Verbal Expression: Appears within functional limits for tasks assessed Written Expression Dominant  Hand: Right Written Expression: Not tested   Oral / Motor Oral Motor/Sensory Function Overall Oral Motor/Sensory Function: Impaired Lingual ROM: Reduced left Lingual Symmetry: Abnormal symmetry left Facial ROM: Reduced left Facial Symmetry: Left droop Motor Speech Overall Motor Speech: Appears within functional limits for tasks assessed   GO   Cristina Stock B. Quentin Ore [facility], CCC-SLP [phone]  Shonna Chock 12/19/2013, 3:30 PM

## 2013-12-19 NOTE — Progress Notes (Signed)
STROKE TEAM PROGRESS NOTE   HISTORY Cristina Patton is an 80 y.o. 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 Family is not available and she seems confused, unable to provide reliable clinical information, and thus all clinical information is obtained from her chart. She resides at home and today 12/17/2013 " presents to the hospital after she was brought in by family due to increasing weakness over the last few days. Patient appears to have some degree of confusion and is unable to provide much history. Unfortunately family had left. Previous notes reveal that patient actually was able to move her left upper and lower extremities, despite the stroke. She was unable to do that when I examined. Called one of her nephews and who tells me that this left-sided weakness has become more profound over the last 2-3 days". CT brain revealed no acute intracranial abnormality but subsequent MRI brain revealed patchy acute infarcts in the right ACA / MCA watershed territory involving the superior frontal gyrus, mostly in the pre motor area. No mass effect or hemorrhage. MRA brain: new distal right ACA occlusion and high-grade irregularity of the A2 segment. Patient is on plavix. last known well is unknown. Patient was not administered TPA secondary to unknown last known well. She was admitted for further evaluation and treatment.  SUBJECTIVE (INTERVAL HISTORY) No family member in room. Her condition much improved than yesterday. She sit in chair and able to greet me when I was in room. She was able to answer some simple question and followed most simple commands.  OBJECTIVE Temp:  [98 F (36.7 C)-99.5 F (37.5 C)] 98.1 F (36.7 C) (08/19 1005) Pulse Rate:  [92-124] 93 (08/19 1005) Resp:  [20] 20 (08/19 1005) BP:  (157-177)/(69-97) 166/69 mmHg (08/19 1005) SpO2:  [97 %-100 %] 99 % (08/19 1005)  Dg Chest 2 View 12/17/2013   IMPRESSION: LEFT upper lobe infiltrate and mild bibasilar atelectasis.     Ct Head Wo Contrast 12/17/2013   IMPRESSION: No acute findings.  Age-related changes as described.     Mr Dayna Barker Head Wo Contrast 12/17/2013   IMPRESSION: 1. Patchy acute infarcts in the right ACA / MCA watershed territory involving the superior frontal gyrus, mostly in the pre motor area. No mass effect or hemorrhage. 2. New distal right ACA occlusion and high-grade irregularity of the A2 segment, concordant with #1. Right MCA M2 branch atherosclerosis and flow appears stable since 2014. 3. Interval mild progression of right ICA siphon and basilar artery atherosclerosis, but no other new or progressed hemodynamically significant stenosis compared to the 2014 MRA.    CUS - pending  2D echo - pending  Recent Results (from the past 2160 hour(s))  APTT     Status: Abnormal   Collection Time    12/17/13 10:34 AM      Result Value Ref Range   aPTT 38 (*) 24 - 37 seconds   Comment:            IF BASELINE aPTT IS ELEVATED,     SUGGEST PATIENT RISK ASSESSMENT     BE USED TO DETERMINE APPROPRIATE     ANTICOAGULANT THERAPY.  CBC WITH DIFFERENTIAL     Status: Abnormal   Collection Time    12/17/13 10:34 AM      Result Value Ref Range  WBC 8.7  4.0 - 10.5 K/uL   RBC 5.02  3.87 - 5.11 MIL/uL   Hemoglobin 13.5  12.0 - 15.0 g/dL   HCT 40.5  36.0 - 46.0 %   MCV 80.7  78.0 - 100.0 fL   MCH 26.9  26.0 - 34.0 pg   MCHC 33.3  30.0 - 36.0 g/dL   RDW 16.5 (*) 11.5 - 15.5 %   Platelets 908 (*) 150 - 400 K/uL   Comment: CRITICAL RESULT CALLED TO, READ BACK BY AND VERIFIED WITH:     BRITTANY CHANDLER,RN AT 1110 12/17/13 BY ZBEECH.   Neutrophils Relative % 65  43 - 77 %   Neutro Abs 5.7  1.7 - 7.7 K/uL   Lymphocytes Relative 25  12 - 46 %   Lymphs Abs 2.2  0.7 - 4.0 K/uL   Monocytes Relative 10  3 - 12 %    Monocytes Absolute 0.9  0.1 - 1.0 K/uL   Eosinophils Relative 0  0 - 5 %   Eosinophils Absolute 0.0  0.0 - 0.7 K/uL   Basophils Relative 0  0 - 1 %   Basophils Absolute 0.0  0.0 - 0.1 K/uL  COMPREHENSIVE METABOLIC PANEL     Status: Abnormal   Collection Time    12/17/13 10:34 AM      Result Value Ref Range   Sodium 143  137 - 147 mEq/L   Potassium 3.6 (*) 3.7 - 5.3 mEq/L   Chloride 105  96 - 112 mEq/L   CO2 23  19 - 32 mEq/L   Glucose, Bld 96  70 - 99 mg/dL   BUN 11  6 - 23 mg/dL   Creatinine, Ser 0.46 (*) 0.50 - 1.10 mg/dL   Calcium 9.8  8.4 - 10.5 mg/dL   Total Protein 7.9  6.0 - 8.3 g/dL   Albumin 3.3 (*) 3.5 - 5.2 g/dL   AST 13  0 - 37 U/L   ALT 7  0 - 35 U/L   Alkaline Phosphatase 59  39 - 117 U/L   Total Bilirubin 0.7  0.3 - 1.2 mg/dL   GFR calc non Af Amer 88 (*) >90 mL/min   GFR calc Af Amer >90  >90 mL/min   Comment: (NOTE)     The eGFR has been calculated using the CKD EPI equation.     This calculation has not been validated in all clinical situations.     eGFR's persistently <90 mL/min signify possible Chronic Kidney     Disease.   Anion gap 15  5 - 15  ETHANOL     Status: None   Collection Time    12/17/13 10:34 AM      Result Value Ref Range   Alcohol, Ethyl (B) <11  0 - 11 mg/dL   Comment:            LOWEST DETECTABLE LIMIT FOR     SERUM ALCOHOL IS 11 mg/dL     FOR MEDICAL PURPOSES ONLY  TROPONIN I     Status: None   Collection Time    12/17/13 10:34 AM      Result Value Ref Range   Troponin I <0.30  <0.30 ng/mL   Comment:            Due to the release kinetics of cTnI,     a negative result within the first hours     of the onset of symptoms does not rule out  myocardial infarction with certainty.     If myocardial infarction is still suspected,     repeat the test at appropriate intervals.  PATHOLOGIST SMEAR REVIEW     Status: None   Collection Time    12/17/13 10:34 AM      Result Value Ref Range   Path Review MARKEDLY INCREASED PLATELET  COUNT     Comment: OF >900,000. PRIMARY HEMATOLOGIC     DISORDER NOT EXCLUDED. SUGGEST FULL     HEMATOLOGIC EVALUATION, IF     CLINICALLY INDICATED.     Reviewed by Chrystie Nose. Saralyn Pilar, M.D.     12/17/13.  HEMOGLOBIN A1C     Status: None   Collection Time    12/17/13 10:34 AM      Result Value Ref Range   Hemoglobin A1C 5.5  <5.7 %   Comment: (NOTE)                                                                               According to the ADA Clinical Practice Recommendations for 2011, when     HbA1c is used as a screening test:      >=6.5%   Diagnostic of Diabetes Mellitus               (if abnormal result is confirmed)     5.7-6.4%   Increased risk of developing Diabetes Mellitus     References:Diagnosis and Classification of Diabetes Mellitus,Diabetes     ESPQ,3300,76(AUQJF 1):S62-S69 and Standards of Medical Care in             Diabetes - 2011,Diabetes Care,2011,34 (Suppl 1):S11-S61.   Mean Plasma Glucose 111  <117 mg/dL   Comment: Performed at [facility] ([place])     Status: None   Collection Time    12/17/13  1:10 PM      Result Value Ref Range   Opiates NONE DETECTED  NONE DETECTED   Cocaine NONE DETECTED  NONE DETECTED   Benzodiazepines NONE DETECTED  NONE DETECTED   Amphetamines NONE DETECTED  NONE DETECTED   Tetrahydrocannabinol NONE DETECTED  NONE DETECTED   Barbiturates NONE DETECTED  NONE DETECTED   Comment:            DRUG SCREEN FOR MEDICAL PURPOSES     ONLY.  IF CONFIRMATION IS NEEDED     FOR ANY PURPOSE, NOTIFY LAB     WITHIN 5 DAYS.                LOWEST DETECTABLE LIMITS     FOR URINE DRUG SCREEN     Drug Class       Cutoff (ng/mL)     Amphetamine      1000     Barbiturate      200     Benzodiazepine   354     Tricyclics       562     Opiates          300     Cocaine          300     THC  50  URINALYSIS, ROUTINE W REFLEX MICROSCOPIC     Status: Abnormal   Collection Time    12/17/13  1:10 PM       Result Value Ref Range   Color, Urine YELLOW  YELLOW   APPearance CLEAR  CLEAR   Specific Gravity, Urine 1.018  1.005 - 1.030   pH 6.5  5.0 - 8.0   Glucose, UA NEGATIVE  NEGATIVE mg/dL   Hgb urine dipstick NEGATIVE  NEGATIVE   Bilirubin Urine NEGATIVE  NEGATIVE   Ketones, ur 15 (*) NEGATIVE mg/dL   Protein, ur NEGATIVE  NEGATIVE mg/dL   Urobilinogen, UA 1.0  0.0 - 1.0 mg/dL   Nitrite NEGATIVE  NEGATIVE   Leukocytes, UA NEGATIVE  NEGATIVE   Comment: MICROSCOPIC NOT DONE ON URINES WITH NEGATIVE PROTEIN, BLOOD, LEUKOCYTES, NITRITE, OR GLUCOSE <1000 mg/dL.  HIV ANTIBODY (ROUTINE TESTING)     Status: None   Collection Time    12/17/13  6:28 PM      Result Value Ref Range   HIV 1&2 Ab, 4th Generation NONREACTIVE  NONREACTIVE   Comment: (NOTE)     A NONREACTIVE HIV Ag/Ab result does not exclude HIV infection since     the time frame for seroconversion is variable. If acute HIV infection     is suspected, a HIV-1 RNA Qualitative TMA test is recommended.     HIV-1/2 Antibody Diff         Not indicated.     HIV-1 RNA, Qual TMA           Not indicated.     PLEASE NOTE: This information has been disclosed to you from records     whose confidentiality may be protected by state law. If your state     requires such protection, then the state law prohibits you from making     any further disclosure of the information without the specific written     consent of the person to whom it pertains, or as otherwise permitted     by law. A general authorization for the release of medical or other     information is NOT sufficient for this purpose.     The performance of this assay has not been clinically validated in     patients less than 52 years old.     Performed at [facility]  LIPID PANEL     Status: Abnormal   Collection Time    12/18/13  5:09 AM      Result Value Ref Range   Cholesterol 170  0 - 200 mg/dL   Triglycerides 57  <150 mg/dL   HDL 55  >39 mg/dL   Total CHOL/HDL Ratio  3.1     VLDL 11  0 - 40 mg/dL   LDL Cholesterol 104 (*) 0 - 99 mg/dL   Comment:            Total Cholesterol/HDL:CHD Risk     Coronary Heart Disease Risk Table                         Men   Women      1/2 Average Risk   3.4   3.3      Average Risk       5.0   4.4      2 X Average Risk   9.6   7.1      3 X Average Risk  23.4  11.0                Use the calculated Patient Ratio     above and the CHD Risk Table     to determine the patient's CHD Risk.                ATP III CLASSIFICATION (LDL):      <100     mg/dL   Optimal      100-129  mg/dL   Near or Above                        Optimal      130-159  mg/dL   Borderline      160-189  mg/dL   High      >190     mg/dL   Very High  COMPREHENSIVE METABOLIC PANEL     Status: Abnormal   Collection Time    12/18/13  5:09 AM      Result Value Ref Range   Sodium 138  137 - 147 mEq/L   Potassium 3.8  3.7 - 5.3 mEq/L   Chloride 101  96 - 112 mEq/L   CO2 19  19 - 32 mEq/L   Glucose, Bld 143 (*) 70 - 99 mg/dL   BUN 10  6 - 23 mg/dL   Creatinine, Ser 0.42 (*) 0.50 - 1.10 mg/dL   Calcium 9.7  8.4 - 10.5 mg/dL   Total Protein 7.8  6.0 - 8.3 g/dL   Albumin 3.3 (*) 3.5 - 5.2 g/dL   AST 14  0 - 37 U/L   ALT 5  0 - 35 U/L   Alkaline Phosphatase 61  39 - 117 U/L   Total Bilirubin 0.8  0.3 - 1.2 mg/dL   GFR calc non Af Amer >90  >90 mL/min   GFR calc Af Amer >90  >90 mL/min   Comment: (NOTE)     The eGFR has been calculated using the CKD EPI equation.     This calculation has not been validated in all clinical situations.     eGFR's persistently <90 mL/min signify possible Chronic Kidney     Disease.   Anion gap 18 (*) 5 - 15  CBC     Status: Abnormal   Collection Time    12/18/13  5:09 AM      Result Value Ref Range   WBC 11.7 (*) 4.0 - 10.5 K/uL   RBC 4.78  3.87 - 5.11 MIL/uL   Hemoglobin 13.1  12.0 - 15.0 g/dL   HCT 38.7  36.0 - 46.0 %   MCV 81.0  78.0 - 100.0 fL   MCH 27.4  26.0 - 34.0 pg   MCHC 33.9  30.0 - 36.0 g/dL    RDW 16.2 (*) 11.5 - 15.5 %   Platelets 929 (*) 150 - 400 K/uL   Comment: REPEATED TO VERIFY     CRITICAL VALUE NOTED.  VALUE IS CONSISTENT WITH PREVIOUSLY REPORTED AND CALLED VALUE.  STREP PNEUMONIAE URINARY ANTIGEN     Status: None   Collection Time    12/18/13  5:54 AM      Result Value Ref Range   Strep Pneumo Urinary Antigen NEGATIVE  NEGATIVE   Comment:            Infection due to S. pneumoniae     cannot be absolutely ruled out     since the antigen present  may be below the detection limit     of the test.  GLUCOSE, CAPILLARY     Status: Abnormal   Collection Time    12/18/13 11:34 AM      Result Value Ref Range   Glucose-Capillary 123 (*) 70 - 99 mg/dL  GLUCOSE, CAPILLARY     Status: Abnormal   Collection Time    12/18/13  5:01 PM      Result Value Ref Range   Glucose-Capillary 113 (*) 70 - 99 mg/dL   Comment 1 Notify RN     Comment 2 Documented in Chart    GLUCOSE, CAPILLARY     Status: Abnormal   Collection Time    12/18/13 10:44 PM      Result Value Ref Range   Glucose-Capillary 110 (*) 70 - 99 mg/dL  GLUCOSE, CAPILLARY     Status: Abnormal   Collection Time    12/19/13  4:20 AM      Result Value Ref Range   Glucose-Capillary 106 (*) 70 - 99 mg/dL  CBC     Status: Abnormal   Collection Time    12/19/13  5:45 AM      Result Value Ref Range   WBC 9.4  4.0 - 10.5 K/uL   Comment: WHITE COUNT CONFIRMED ON SMEAR   RBC 4.49  3.87 - 5.11 MIL/uL   Hemoglobin 12.2  12.0 - 15.0 g/dL   HCT 36.5  36.0 - 46.0 %   MCV 81.3  78.0 - 100.0 fL   MCH 27.2  26.0 - 34.0 pg   MCHC 33.4  30.0 - 36.0 g/dL   RDW 16.6 (*) 11.5 - 15.5 %   Platelets 853 (*) 150 - 400 K/uL   Comment: PLATELET COUNT CONFIRMED BY SMEAR  BASIC METABOLIC PANEL     Status: Abnormal   Collection Time    12/19/13  5:45 AM      Result Value Ref Range   Sodium 139  137 - 147 mEq/L   Potassium 3.8  3.7 - 5.3 mEq/L   Chloride 103  96 - 112 mEq/L   CO2 22  19 - 32 mEq/L   Glucose, Bld 101 (*) 70 - 99  mg/dL   BUN 11  6 - 23 mg/dL   Creatinine, Ser 0.50  0.50 - 1.10 mg/dL   Calcium 9.7  8.4 - 10.5 mg/dL   GFR calc non Af Amer 86 (*) >90 mL/min   GFR calc Af Amer >90  >90 mL/min   Comment: (NOTE)     The eGFR has been calculated using the CKD EPI equation.     This calculation has not been validated in all clinical situations.     eGFR's persistently <90 mL/min signify possible Chronic Kidney     Disease.   Anion gap 14  5 - 15  GLUCOSE, CAPILLARY     Status: Abnormal   Collection Time    12/19/13  8:18 AM      Result Value Ref Range   Glucose-Capillary 103 (*) 70 - 99 mg/dL    PHYSICAL EXAM  General - thin, well developed, sitting in chair, able to greet me when I was in room. She was able to answer some simple question and followed most simple commands.  Ophthalmologic - not cooperative for exam.  Cardiovascular - Regular rate and rhythm with no murmur.  Mental Status:  Awake alert, AAO x 3, able to greet me when I was in room. She was  able to answer some simple question and followed most simple commands. Grossly intact repeating, naming, expression and comprehension. Cranial Nerves:  II: visual field full  III,IV, VI: EOMI, PERRL.  V,VII: mild left facial droop.  VIII: hearing grossly symmetrical bilaterally  IX,X: gag reflex present  XI: bilateral shoulder shrug no tested  XII: tongue in middle on protrusion Motor:  left UE 2/5, and BLE 3/5 with painful stimulation, right UE spontaneous movement against gravity. BLE increased muscle tone. Sensory: symmetrical  Deep Tendon Reflexes:  1+ all over Plantars:  Right: downgoing Left: mute  Cerebellar:  Grossly intact FTN BUE.  Gait:  Not tested for safety   ASSESSMENT/PLAN  Cristina Patton is a 80 y.o. female with hx of right small cerebellar stroke Nov 2014 and diffuse arthrosclerotic changes intracranial vessels including right A2 stenosis presenting with worsening left hemiparesis. She did not receive IV  t-PA due to delay in arrival. Imaging confirms right ACA infarct with advance of right A2 stenosis and occlusion. Stroke work up underway.  Stroke/TIA:  right ACA thrombotic infarct secondary to severe diffuse intracranial atherosclerosis     clopidogrel 75 mg orally every day prior to admission, now on aspirin 300 mg suppository. Once able to swallow, recommend aspirin and plavix together x 3 weeks then plavix alone  MRI - Patchy R ACA infarct, superior frontal gyrus in the pre motor area  MRA - New distal R ACA A2 occlusion and high-grade irregularity. R MCA M2 branch atherosclerosis and flow stable since 2014. R ICA siphon and basilar artery atherosclerosis.   2D Echo  pending  Carotid  pending  HgbA1c 5.5 WNL  Lovenox 40 mg sq daily for VTE prophylaxis  Dysphagia, NPO for now. ST following  OOB with assistance  Therapy needs: SNF  Accelerated Hypertension   Home meds:  norvasc 5. Not yet resumed in hospital (NPO). Resume once able to swallow  BP still high for the last 24 hours  SBP goal - gradually normalize to 120-140/70-90  Hyperlipidemia  LDL 104   Patient on no statin at home  Recommend lipitor once able to swallow  LDL goal < 100   Other Stroke Risk Factors   Hx stroke - R cerebellar w/ residual L HP 03/22/2013   Advanced age  Other Active Problems  Community acquired PNA, on ceftriaxone & azithromycin  Plts elevated, 908, 929 - likely acute reaction - keep monitoring  Severe malnutrition in context of chronic illness, Body mass index is 18.49 kg/(m^2).   Other Pertinent History  Right inguinal hernia, surgery declined May 2015  Hospital day # 2  Burnetta Sabin, MSN, RN, ANVP-BC, ANP-BC, [facility] Pager: [phone] 12/19/2013 10:25 AM  I, the attending vascular neurologist, have personally obtained a history, examined the patient, evaluated laboratory data, individually viewed imaging studies, and formulated the assessment  and plan of care.  I have made any additions or clarifications directly to the above note and agree with the findings and plan as currently documented.   Rosalin Hawking, MD PhD Stroke Neurology 12/19/2013 3:13 PM  To contact Stroke Continuity provider, please refer to [email or web address] After hours, contact General Neurology

## 2013-12-20 LAB — GLUCOSE, CAPILLARY
GLUCOSE-CAPILLARY: 88 mg/dL (ref 70–99)
GLUCOSE-CAPILLARY: 92 mg/dL (ref 70–99)
GLUCOSE-CAPILLARY: 96 mg/dL (ref 70–99)
GLUCOSE-CAPILLARY: 99 mg/dL (ref 70–99)
Glucose-Capillary: 108 mg/dL — ABNORMAL HIGH (ref 70–99)
Glucose-Capillary: 108 mg/dL — ABNORMAL HIGH (ref 70–99)
Glucose-Capillary: 95 mg/dL (ref 70–99)

## 2013-12-20 MED ORDER — AMLODIPINE BESYLATE 5 MG PO TABS
5.0000 mg | ORAL_TABLET | Freq: Every day | ORAL | Status: DC
  Administered 2013-12-20 – 2013-12-22 (×3): 5 mg via ORAL
  Filled 2013-12-20 (×3): qty 1

## 2013-12-20 MED ORDER — ATORVASTATIN CALCIUM 10 MG PO TABS
20.0000 mg | ORAL_TABLET | Freq: Every day | ORAL | Status: DC
  Administered 2013-12-20: 20 mg via ORAL
  Filled 2013-12-20 (×2): qty 2

## 2013-12-20 MED ORDER — SODIUM CHLORIDE 0.9 % IV SOLN
INTRAVENOUS | Status: DC
  Administered 2013-12-20 – 2013-12-21 (×3): via INTRAVENOUS

## 2013-12-20 MED ORDER — AZITHROMYCIN 500 MG PO TABS
500.0000 mg | ORAL_TABLET | Freq: Every day | ORAL | Status: DC
  Administered 2013-12-20 – 2013-12-21 (×2): 500 mg via ORAL
  Filled 2013-12-20 (×2): qty 1

## 2013-12-20 MED ORDER — METOPROLOL TARTRATE 12.5 MG HALF TABLET
12.5000 mg | ORAL_TABLET | Freq: Two times a day (BID) | ORAL | Status: DC
  Administered 2013-12-20 (×2): 12.5 mg via ORAL
  Filled 2013-12-20 (×2): qty 1

## 2013-12-20 NOTE — Care Management Note (Addendum)
  Page 1 of 1   12/20/2013     10:35:09 AM CARE MANAGEMENT NOTE 12/20/2013  Patient:  Cristina Patton, Cristina Patton   Account Number:  [id number]  Date Initiated:  12/20/2013  Documentation initiated by:  Lorne Skeens  Subjective/Objective Assessment:   Patient was admitted with CVA. Lives at home. Has a [facility] aide.     Action/Plan:   Will follow for discharge needs pending PT/OT evals and physician orders.   Anticipated DC Date:  12/21/2013   Anticipated DC Plan:  SKILLED NURSING FACILITY  In-house referral  Clinical Social Worker         Choice offered to / List presented to:             Status of service:  Completed, signed off Medicare Important Message given?  YES (If response is "NO", the following Medicare IM given date fields will be blank) Date Medicare IM given:  12/20/2013 Medicare IM given by:  Lorne Skeens Date Additional Medicare IM given:   Additional Medicare IM given by:    Discharge Disposition:    Per UR Regulation:  Reviewed for med. necessity/level of care/duration of stay  If discussed at [place] of Stay Meetings, dates discussed:    Comments:  12/20/13 [place], MSN, CM- Medicare IM letter provided.

## 2013-12-20 NOTE — Progress Notes (Signed)
[facility] PROGRESS NOTE  DEMMI SINDT UUV:[id number] DOB: Mar 30, 1927 DOA: 12/17/2013 PCP: PROVIDER NOT IN SYSTEM  Assessment/Plan: 80 year old female with history of stroke hypertension that is poorly controlled, ischemic strokes with residual left hemiparesis Nov 2014 presented to the emergency department with increasing weakness over the last few days and found to have acute CVA  -Patient also has community-acquired pneumonia versus aspiration pneumonia.    1. Acute CVA, L sided hemiparesis  -MRI of the brain:Patchy acute infarcts in the right ACA / MCA watershed territory involving the superior frontal gyrus, mostly in the pre motor area  -MRA of the head: New distal right ACA occlusion and high-grade irregularity of the A2 segment,  -Pending carotid Doppler,  -Hemoglobin A1c 5.5, LDL 104  -PT and OT consults: SNF -Speech evaluated Dys 1; patient declined PEG -Neurology consulted appreciated, recommended aspirin and Plavix for 3 weeks, thereafter Plavix alone  -Patient will likely need nursing home placement, currently lives at home alone.   2. Accelerated hypertension  -Allowed permissive hypertension with hydralazine as needed, resume amlodipine, added low dose BB 3. Community acquired pneumonia versus possible aspiration pneumonia  -Patient currently on ceftriaxone and azithromycin; change to PO atx in AM  -Continue oxygen as needed to maintain saturations above 92%  4. Thrombocytosis  Possibly acute phase reactant -Will likely require workup once she improves clinically  -Patient has no history of smoking  -Blood smear pending  5. Recent right inguinal hernia  -Diagnosed in May of 2015  -Surgery was offered at that time, patient declined.  -Appears stable  Study Conclusions  - Left ventricle: The cavity size was normal. Systolic function was normal. The estimated ejection fraction was in the range of 55% to 60%. Wall motion was normal; there were no regional  wall motion abnormalities. - Aortic valve: Moderate thickening and calcification, consistent with sclerosis. There was trivial regurgitation. - Mitral valve: Severely calcified annulus. There was trivial regurgitation. - Tricuspid valve: There was trivial regurgitation. - Pulmonic valve: There was mild regurgitation.   D/w patient, confirmed; Pt declined PEG -Pt is DNR  Code Status: DNR Family Communication: d/w patient, updated DPOA, yesterday (indicate person spoken with, relationship, and if by phone, the number) Disposition Plan: SNF 24-48 hrs    Consultants:  Neurology   Procedures:  none  Antibiotics:  Ceftriaxone azith 817<<<<   (indicate start date, and stop date if known)  HPI/Subjective: alert  Objective: Filed Vitals:   12/20/13 0625  BP: 176/80  Pulse: 106  Temp: 98.2 F (36.8 C)  Resp: 16    Intake/Output Summary (Last 24 hours) at 12/20/13 0932 Last data filed at 12/19/13 1600  Gross per 24 hour  Intake    420 ml  Output      0 ml  Net    420 ml   Filed Weights   12/17/13 1604  Weight: 52.2 kg (115 lb 1.3 oz)    Exam:   General:  alert  Cardiovascular: s1,s2 rrr  Respiratory: CTA BL  Abdomen: soft, nt,nd   Musculoskeletal: no LE edema   Data Reviewed: Basic Metabolic Panel:  Recent Labs Lab 12/17/13 1034 12/18/13 0509 12/19/13 0545  NA 143 138 139  K 3.6* 3.8 3.8  CL 105 101 103  CO2 23 19 22   GLUCOSE 96 143* 101*  BUN 11 10 11   CREATININE 0.46* 0.42* 0.50  CALCIUM 9.8 9.7 9.7   Liver Function Tests:  Recent Labs Lab 12/17/13 1034 12/18/13 0509  AST 13 14  ALT 7 5  ALKPHOS 59 61  BILITOT 0.7 0.8  PROT 7.9 7.8  ALBUMIN 3.3* 3.3*   No results found for this basename: LIPASE, AMYLASE,  in the last 168 hours No results found for this basename: AMMONIA,  in the last 168 hours CBC:  Recent Labs Lab 12/17/13 1034 12/18/13 0509 12/19/13 0545  WBC 8.7 11.7* 9.4  NEUTROABS 5.7  --   --   HGB 13.5 13.1  12.2  HCT 40.5 38.7 36.5  MCV 80.7 81.0 81.3  PLT 908* 929* 853*   Cardiac Enzymes:  Recent Labs Lab 12/17/13 1034  TROPONINI <0.30   BNP (last 3 results) No results found for this basename: PROBNP,  in the last 8760 hours CBG:  Recent Labs Lab 12/19/13 1613 12/19/13 1956 12/20/13 0022 12/20/13 0504 12/20/13 0831  GLUCAP 140* 109* 88 95 92    No results found for this or any previous visit (from the past 240 hour(s)).   Studies: No results found.  Scheduled Meds: .  stroke: mapping our early stages of recovery book   Does not apply Once  . aspirin  325 mg Oral Daily   Or  . aspirin  300 mg Rectal Daily  . azithromycin  500 mg Intravenous Q24H  . cefTRIAXone (ROCEPHIN)  IV  1 g Intravenous Q24H  . clopidogrel  75 mg Oral Q breakfast  . enoxaparin (LOVENOX) injection  40 mg Subcutaneous Q24H   Continuous Infusions:   Principal Problem:   Acute CVA (cerebrovascular accident) Active Problems:   Dehydration   Acute encephalopathy   CAP (community acquired pneumonia)   History of stroke   Accelerated hypertension   Left hemiparesis   Thrombocythemia    Time spent: >35 minutes     Kinnie Feil  [facility] Pager [phone]. If 7PM-7AM, please contact night-coverage at [email or web address], password [facility] 12/20/2013, 9:32 AM  LOS: 3 days

## 2013-12-20 NOTE — Progress Notes (Signed)
STROKE TEAM PROGRESS NOTE   HISTORY Cristina Patton is an 80 y.o. 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 Family is not available and she seems confused, unable to provide reliable clinical information, and thus all clinical information is obtained from her chart. She resides at home and today 12/17/2013 " presents to the hospital after she was brought in by family due to increasing weakness over the last few days. Patient appears to have some degree of confusion and is unable to provide much history. Unfortunately family had left. Previous notes reveal that patient actually was able to move her left upper and lower extremities, despite the stroke. She was unable to do that when I examined. Called one of her nephews and who tells me that this left-sided weakness has become more profound over the last 2-3 days". CT brain revealed no acute intracranial abnormality but subsequent MRI brain revealed patchy acute infarcts in the right ACA / MCA watershed territory involving the superior frontal gyrus, mostly in the pre motor area. No mass effect or hemorrhage. MRA brain: new distal right ACA occlusion and high-grade irregularity of the A2 segment. Patient is on plavix. last known well is unknown. Patient was not administered TPA secondary to unknown last known well. She was admitted for further evaluation and treatment.  SUBJECTIVE (INTERVAL HISTORY) RN and student RN are at the bedside. Patient denies problems other than "I need a million dollars".   OBJECTIVE Temp:  [98.1 F (36.7 C)-98.6 F (37 C)] 98.2 F (36.8 C) (08/20 0625) Pulse Rate:  [92-106] 106 (08/20 0625) Resp:  [16-20] 16 (08/20 0625) BP: (156-176)/(69-84) 176/80 mmHg (08/20 0625) SpO2:  [97 %-100 %] 97 % (08/20 0625)  Dg Chest 2 View 12/17/2013    IMPRESSION: LEFT upper lobe infiltrate and mild bibasilar atelectasis.     Ct Head Wo Contrast 12/17/2013   IMPRESSION: No acute findings.  Age-related changes as described.     Mr Dayna Barker Head Wo Contrast 12/17/2013   IMPRESSION: 1. Patchy acute infarcts in the right ACA / MCA watershed territory involving the superior frontal gyrus, mostly in the pre motor area. No mass effect or hemorrhage. 2. New distal right ACA occlusion and high-grade irregularity of the A2 segment, concordant with #1. Right MCA M2 branch atherosclerosis and flow appears stable since 2014. 3. Interval mild progression of right ICA siphon and basilar artery atherosclerosis, but no other new or progressed hemodynamically significant stenosis compared to the 2014 MRA.    CUS - Bilateral: 1-39% ICA stenosis. Vertebral artery flow is antegrade.   2D echo - EF 55-60% with no source of embolus. Moderate thickening & calcification AV.  BMET    Component Value Date/Time   NA 139 12/19/2013 0545   K 3.8 12/19/2013 0545   CL 103 12/19/2013 0545   CO2 22 12/19/2013 0545   GLUCOSE 101* 12/19/2013 0545   BUN 11 12/19/2013 0545   CREATININE 0.50 12/19/2013 0545   CALCIUM 9.7 12/19/2013 0545   GFRNONAA 86* 12/19/2013 0545   GFRAA >90 12/19/2013 0545   CBC    Component Value Date/Time   WBC 9.4 12/19/2013 0545   RBC 4.49 12/19/2013 0545   HGB 12.2 12/19/2013 0545   HCT 36.5 12/19/2013 0545   PLT 853* 12/19/2013 0545   MCV 81.3 12/19/2013 0545  MCH 27.2 12/19/2013 0545   MCHC 33.4 12/19/2013 0545   RDW 16.6* 12/19/2013 0545   LYMPHSABS 2.2 12/17/2013 1034   MONOABS 0.9 12/17/2013 1034   EOSABS 0.0 12/17/2013 1034   BASOSABS 0.0 12/17/2013 1034    PHYSICAL EXAM  General - thin, well developed, sitting in chair, able to greet me when I was in room. She was able to answer some simple question and followed most simple commands.  Ophthalmologic - not cooperative for exam.  Cardiovascular - Regular rate and rhythm with no murmur.  Mental  Status:  Awake alert, AAO x 3, able to greet me when I was in room. She was able to answer some simple question and followed most simple commands. Grossly intact repeating, naming, expression and comprehension. Cranial Nerves:  II: visual field full  III,IV, VI: EOMI, PERRL.  V,VII: mild left facial droop.  VIII: hearing grossly symmetrical bilaterally  IX,X: gag reflex present  XI: bilateral shoulder shrug no tested  XII: tongue in middle on protrusion Motor:  left UE 2/5, and BLE 3/5 with painful stimulation, right UE spontaneous movement against gravity. BLE increased muscle tone. Sensory: symmetrical  Deep Tendon Reflexes:  1+ all over Plantars:  Right: downgoing Left: mute  Cerebellar:  Grossly intact FTN BUE.  Gait:  Not tested for safety   ASSESSMENT/PLAN  [name] is a 80 y.o. female with hx of right small cerebellar stroke Nov 2014 and diffuse arthrosclerotic changes intracranial vessels including right A2 stenosis presenting with worsening left hemiparesis. She did not receive IV t-PA due to delay in arrival. Imaging confirms right ACA infarct with advance of right A2 stenosis and occlusion. Stroke work up underway.  Stroke/TIA:  right ACA thrombotic infarct secondary to severe diffuse intracranial atherosclerosis     clopidogrel 75 mg orally every day prior to admission, now on aspirin and plavix together x 3 weeks then plavix alone   MRI - Patchy R ACA infarct, superior frontal gyrus in the pre motor area  MRA - New distal R ACA A2 occlusion and high-grade irregularity. R MCA M2 branch atherosclerosis and flow stable since 2014. R ICA siphon and basilar artery atherosclerosis.   2D Echo  No source of embolus  Carotid  Doppler - unremarkable.   HgbA1c 5.5 WNL  Lovenox 40 mg sq daily for VTE prophylaxis  Dysphagia on Dysphagia 1 thin liquids. Patient does not want to consider a PEG.  OOB with assistance  Therapy needs: SNF  Dispo: skilled  nursing facility   Accelerated Hypertension   Home meds:  norvasc 5. Ok to resume from neuro standpoint.   BP elevated last 24 hours  SBP goal - gradually normalize to 120-140/70-90  Hyperlipidemia  LDL 104   Patient on no statin at home  Add lipitor 20 mg daily  LDL goal < 100   Other Stroke Risk Factors   Hx stroke - R cerebellar w/ residual L HP 03/22/2013   Advanced age  Other Active Problems  Community acquired PNA, on ceftriaxone & azithromycin  Plts elevated, 908, 929 - likely acute reaction - gradually decreasing   Severe malnutrition in context of chronic illness, Body mass index is 18.49 kg/(m^2).   Other Pertinent History  Right inguinal hernia, surgery declined May 2015  Hospital day # 3  No further stroke workup indicated.  Patient has a 10-15% risk of having another stroke over the next year, the highest risk is within 2 weeks of the most recent stroke/TIA (risk  of having a stroke following a stroke or TIA is the same).  Ongoing risk factor control by Primary Care Physician  Stroke Service will sign off. Please call should any needs arise or abnormal carotid doppler results  Follow up with Dr. Erlinda Hong, [facility], in 2 months.  Burnetta Sabin, MSN, RN, ANVP-BC, ANP-BC, [facility] Pager: [phone] 12/20/2013 8:08 AM  I, the attending vascular neurologist, have personally obtained a history, examined the patient, evaluated laboratory data, individually viewed imaging studies, and formulated the assessment and plan of care.  I have made any additions or clarifications directly to the above note and agree with the findings and plan as currently documented.   Rosalin Hawking, MD PhD Stroke Neurology 12/20/2013 3:26 PM  To contact Stroke Continuity provider, please refer to [email or web address] After hours, contact General Neurology

## 2013-12-20 NOTE — Progress Notes (Signed)
*  PRELIMINARY RESULTS* Vascular Ultrasound Carotid Duplex (Doppler) has been completed.  Preliminary findings: Bilateral:  1-39% ICA stenosis.  Vertebral artery flow is antegrade.      Landry Mellow, RDMS, RVT  12/20/2013, 3:05 PM

## 2013-12-20 NOTE — Progress Notes (Signed)
Speech Language Pathology Treatment: Dysphagia  Patient Details Name: Cristina Patton MRN: [id number] DOB: Dec 10, 1926 Today's Date: 12/20/2013 Time: 2993-7169 SLP Time Calculation (min): 35 min  Assessment / Plan / Recommendation Clinical Impression  Pt poorly positioned and required significant repositioning with assistance.  Pt with POOR intake and reports consuming intake is more work for her than prior to this CVA.  SLP spent nearly 30 minutes with pt trying to feed her breakfast - pt consumed only approximately 2-3 bites of magic cup and approximately 2 ounces of liquids.  Delays in swallow up to 14 seconds with purees requiring second swallow delayed up to 8 seconds, 7 seconds delay with liquids make eating/drinking laborious for this pt and place her at high aspiration risk.    Pt states she did not desire a feeding tube and admitted she does not enjoy eating.  When asked if SLP could get her anything, she stated she only needs God.  Hopeful for continued marginal improvement with intake/swallowing but anticipate adequate nutrition will be difficult to achieve.  Recommend follow up at SNF to maximize pt's swallow/cognition rehabilitation due to CVA.     HPI HPI: 80 yo female adm to [facility] with AMS, left hemiparesis.  Per MRI 8/17 pt with patchy acute infarcts in the right ACA / MCA watershed territory involving the superior frontal gyrus, mostly in the pre motor area. No mass effect or hemorrhage.  Pt also with LEFT upper lobe infiltrate and mild bibasilar atelectasis per CXR 8/17.   Pt has been placed on dysphagia 1 and thin diet.     Pertinent Vitals Pain Assessment: No/denies pain  SLP Plan  Continue with current plan of care    Recommendations Diet recommendations: Dysphagia 1 (puree);Thin liquid Liquids provided via: Straw;Cup Medication Administration: Whole meds with puree Supervision: Trained caregiver to feed patient;Staff to assist with self feeding Compensations: Slow  rate;Small sips/bites (allow time for pt to swallow - significantly delayed) Postural Changes and/or Swallow Maneuvers: Seated upright 90 degrees;Upright 30-60 min after meal              Follow up Recommendations: 24 hour supervision/assistance;Skilled Nursing facility Plan: Continue with current plan of care    [place], [place], [place] [facility] SLP [phone]

## 2013-12-20 NOTE — Progress Notes (Signed)
Physical Therapy Treatment Patient Details Name: Cristina Patton MRN: [id number] DOB: 1927/04/10 Today's Date: 12/20/2013    History of Present Illness Pt admitted with AMS.  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 Pt is difficult to arouse presently.  Work up is proceeding,.    PT Comments    Limited progress.  Some voluntary movement on left side, but pt does move her arm or leg to assist functional task and was alert with eyes open for the whole session today.  Follow Up Recommendations  SNF     Equipment Recommendations   (TBA)    Recommendations for Other Services       Precautions / Restrictions Precautions Precautions: Fall Restrictions Weight Bearing Restrictions: No    Mobility  Bed Mobility Overal bed mobility: Needs Assistance Bed Mobility: Supine to Sit     Supine to sit: Total assist;+2 for physical assistance     General bed mobility comments: pt needed v/t cues for guidance and to attempt to get her to initiate movement; significant truncal assist  Transfers Overall transfer level: Needs assistance Equipment used: 2 person hand held assist Transfers: Sit to/from Stand;Stand Pivot Transfers Sit to Stand: Total assist;+2 physical assistance Stand pivot transfers: +2 physical assistance;Total assist       General transfer comment: Stood x3 for extensive pericare.  Initially on first trial, pt assisted minimally with LE's  and on trials 2 and 3, pt did not assist and just "hung out" on knee and trunk support.  Ambulation/Gait             General Gait Details: not able   Stairs            Wheelchair Mobility    Modified Rankin (Stroke Patients Only) Modified Rankin (Stroke Patients Only) Pre-Morbid Rankin Score: Moderately severe disability Modified Rankin: Severe disability     Balance Overall balance assessment: Needs assistance Sitting-balance support: Single extremity supported Sitting  balance-Leahy Scale: Poor Sitting balance - Comments: did not get the same minimal truncal activation as yesterday when she was less responsive.   Standing balance support: Bilateral upper extremity supported Standing balance-Leahy Scale: Zero Standing balance comment: stood x3 for extensive pericare.                    Cognition Arousal/Alertness: Awake/alert Behavior During Therapy: WFL for tasks assessed/performed Overall Cognitive Status: No family/caregiver present to determine baseline cognitive functioning                      Exercises      General Comments        Pertinent Vitals/Pain Pain Assessment: No/denies pain    Home Living                      Prior Function            PT Goals (current goals can now be found in the care plan section) Acute Rehab PT Goals Patient Stated Goal: "I'm doing the best I can.." (holding her head up) PT Goal Formulation: Patient unable to participate in goal setting Time For Goal Achievement: 01/01/14 Potential to Achieve Goals: Fair Progress towards PT goals: Progressing toward goals    Frequency  Min 3X/week    PT Plan Current plan remains appropriate    Co-evaluation             End of Session   Activity Tolerance: Patient tolerated treatment well;Patient  limited by fatigue Patient left: in chair;with call bell/phone within reach;with chair alarm set     Time: [phone] PT Time Calculation (min): 28 min  Charges:  $Therapeutic Activity: 8-22 mins $Neuromuscular Re-education: 8-22 mins                    G Codes:      Syrah Daughtrey, Tessie Fass 12/20/2013, 12:41 PM 12/20/2013  Donnella Sham, PT [phone]  (pager)

## 2013-12-21 LAB — GLUCOSE, CAPILLARY
GLUCOSE-CAPILLARY: 97 mg/dL (ref 70–99)
GLUCOSE-CAPILLARY: 109 mg/dL — AB (ref 70–99)
GLUCOSE-CAPILLARY: 117 mg/dL — AB (ref 70–99)
GLUCOSE-CAPILLARY: 110 mg/dL — AB (ref 70–99)
Glucose-Capillary: 107 mg/dL — ABNORMAL HIGH (ref 70–99)
Glucose-Capillary: 112 mg/dL — ABNORMAL HIGH (ref 70–99)
Glucose-Capillary: 94 mg/dL (ref 70–99)

## 2013-12-21 MED ORDER — ASPIRIN 325 MG PO TABS: 325.0000 mg | ORAL_TABLET | Freq: Every day | ORAL | Status: AC

## 2013-12-21 MED ORDER — METOPROLOL TARTRATE 25 MG PO TABS: 25.0000 mg | ORAL_TABLET | Freq: Two times a day (BID) | ORAL | Status: AC

## 2013-12-21 MED ORDER — AMOXICILLIN-POT CLAVULANATE 875-125 MG PO TABS
1.0000 | ORAL_TABLET | Freq: Two times a day (BID) | ORAL | Status: DC
  Administered 2013-12-22: 1 via ORAL
  Filled 2013-12-21: qty 1

## 2013-12-21 MED ORDER — DEXTROSE-NACL 5-0.9 % IV SOLN: INTRAVENOUS | Status: DC

## 2013-12-21 MED ORDER — ATORVASTATIN CALCIUM 20 MG PO TABS: 20.0000 mg | ORAL_TABLET | Freq: Every day | ORAL | Status: AC

## 2013-12-21 MED ORDER — AMOXICILLIN-POT CLAVULANATE 875-125 MG PO TABS: 1.0000 | ORAL_TABLET | Freq: Two times a day (BID) | ORAL | Status: DC

## 2013-12-21 MED ORDER — METOPROLOL TARTRATE 25 MG PO TABS
25.0000 mg | ORAL_TABLET | Freq: Two times a day (BID) | ORAL | Status: DC
  Administered 2013-12-21 – 2013-12-22 (×2): 25 mg via ORAL
  Filled 2013-12-21 (×2): qty 1

## 2013-12-21 NOTE — Discharge Summary (Signed)
Physician Discharge Summary  Cristina Patton ATF:[id number] DOB: 02/05/1927 DOA: 12/17/2013  PCP: PROVIDER NOT IN SYSTEM  Admit date: 12/17/2013 Discharge date: 12/21/2013  Time spent: >35 minutes  Recommendations for Outpatient Follow-up:  SNF F/u with neurology in 3-4 weeks   Discharge Diagnoses:  Principal Problem:   Acute CVA (cerebrovascular accident) Active Problems:   Dehydration   Acute encephalopathy   CAP (community acquired pneumonia)   History of stroke   Accelerated hypertension   Left hemiparesis   Thrombocythemia   Discharge Condition: stable   Diet recommendation: Dys 1  Filed Weights   12/17/13 1604  Weight: 52.2 kg (115 lb 1.3 oz)    History of present illness:  80 year old female with history of stroke hypertension that is poorly controlled, ischemic strokes with residual left hemiparesis Nov 2014 presented to the emergency department with increasing weakness over the last few days and found to have acute CVA  -Patient also has community-acquired pneumonia versus aspiration pneumonia.    Hospital Course:  1. Acute CVA, L sided hemiparesis; mental status slow to response  -MRI of the brain:Patchy acute infarcts in the right ACA / MCA watershed territory involving the superior frontal gyrus, mostly in the pre motor area  -MRA of the head: New distal right ACA occlusion and high-grade irregularity of the A2 segment,  -Hemoglobin A1c 5.5, LDL 104  -PT and OT consults: SNF  -Speech evaluated Dys 1; patient declined PEG  -Neurology consulted appreciated, recommended aspirin and Plavix for 3 weeks, thereafter Plavix alone  -Patient will likely need nursing home placement, currently lives at home alone.  2. Accelerated hypertension  -Allowed permissive hypertension with hydralazine as needed, resumed amlodipine, added low dose BB  3. Community acquired pneumonia versus possible aspiration pneumonia  -Patient currently on ceftriaxone and azithromycin;  afebrile; changed to PO atx to complete Rx 4. Thrombocytosis Possibly acute phase reactant  -Will likely require workup as outpatient  -Patient has no history of smoking  5. Recent right inguinal hernia  -Diagnosed in May of 2015  -Surgery was offered at that time, patient declined.  -Appears stable    Study Conclusions  - Left ventricle: The cavity size was normal. Systolic function was normal. The estimated ejection fraction was in the range of 55% to 60%. Wall motion was normal; there were no regional wall motion abnormalities. - Aortic valve: Moderate thickening and calcification, consistent with sclerosis. There was trivial regurgitation. - Mitral valve: Severely calcified annulus. There was trivial regurgitation. - Tricuspid valve: There was trivial regurgitation. - Pulmonic valve: There was mild regurgitation.    D/w patient, confirmed; Pt declined PEG  -Pt is DNR   Procedures:  echo (i.e. Studies not automatically included, echos, thoracentesis, etc; not x-rays)  Consultations:  Neurology   Discharge Exam: Filed Vitals:   12/21/13 0940  BP: 186/77  Pulse: 86  Temp: 98.2 F (36.8 C)  Resp: 18    General: alert, slow to response  Cardiovascular: s1,s2 rrr Respiratory: CTA BL  Discharge Instructions  Discharge Instructions   DIET - DYS 1    Complete by:  As directed      Discharge instructions    Complete by:  As directed   Please follow up with neurology in 3-4 weeks     Increase activity slowly    Complete by:  As directed             Medication List         amLODipine 5 MG tablet  Commonly known as:  NORVASC  Take 1 tablet (5 mg total) by mouth daily.     amoxicillin-clavulanate 875-125 MG per tablet  Commonly known as:  AUGMENTIN  Take 1 tablet by mouth 2 (two) times daily.     aspirin 325 MG tablet  Take 1 tablet (325 mg total) by mouth daily.     atorvastatin 20 MG tablet  Commonly known as:  LIPITOR  Take 1 tablet (20 mg  total) by mouth daily at 6 PM.     clopidogrel 75 MG tablet  Commonly known as:  PLAVIX  Take 1 tablet (75 mg total) by mouth daily.     metoprolol tartrate 25 MG tablet  Commonly known as:  LOPRESSOR  Take 1 tablet (25 mg total) by mouth 2 (two) times daily.     polyethylene glycol packet  Commonly known as:  MIRALAX / GLYCOLAX  Take 17 g by mouth daily.       No Known Allergies     Follow-up Information   Follow up with Xu,Jindong, MD. Schedule an appointment as soon as possible for a visit in 2 months. (Stroke Clinic)    Specialty:  Neurology   Contact information:   [address] [place] [address] [phone]        The results of significant diagnostics from this hospitalization (including imaging, microbiology, ancillary and laboratory) are listed below for reference.    Significant Diagnostic Studies: Dg Chest 2 View  12/17/2013   CLINICAL DATA:  Altered mental status, history stroke, hypertension  EXAM: CHEST  2 VIEW  COMPARISON:  04/02/2013  FINDINGS: Upper normal heart size.  Normal mediastinal contours and pulmonary vascularity.  Rotation to the LEFT.  Mild bibasilar atelectasis.  LEFT upper lobe infiltrate present.  Remaining lungs clear.  No pleural effusion or pneumothorax.  Bones demineralized.  Atherosclerotic calcification aortic arch.  IMPRESSION: LEFT upper lobe infiltrate and mild bibasilar atelectasis.   Electronically Signed   By: Lavonia Dana M.D.   On: 12/17/2013 11:20   Ct Head Wo Contrast  12/17/2013   CLINICAL DATA:  Subjective decrease in overall activity, with increasing LEFT-sided weakness. History of prior stroke. History of hypertension.  EXAM: CT HEAD WITHOUT CONTRAST  TECHNIQUE: Contiguous axial images were obtained from the base of the skull through the vertex without contrast.  COMPARISON:  MR head 03/22/2013.  CT head 03/20/2013.  FINDINGS: No evidence for acute infarction, hemorrhage, mass lesion, hydrocephalus, or  extra-axial fluid. Generalized cerebral and cerebellar atrophy. Chronic microvascular ischemic change affects the periventricular and subcortical white matter. There is a remote RIGHT cerebellar infarct which is difficult to visualize. The calvarium is intact. Vascular calcification. There is no acute sinus disease. Trace RIGHT mastoid effusion. When technique differences are considered, similar appearance to priors.  IMPRESSION: No acute findings.  Age-related changes as described.   Electronically Signed   By: Rolla Flatten M.D.   On: 12/17/2013 11:21   Mr Jodene Nam Head Wo Contrast  12/17/2013   ADDENDUM REPORT: 12/17/2013 15:25  ADDENDUM: Study discussed by telephone with Dr. Carmin Muskrat on 12/17/2013 at 15:20 hours.   Electronically Signed   By: Lars Pinks M.D.   On: 12/17/2013 15:25   12/17/2013   CLINICAL DATA:  80 year old female with increasing weakness and confusion. Initial encounter. History of small right inferior cerebellar infarct in 2014.  EXAM: MRI HEAD WITHOUT CONTRAST  MRA HEAD WITHOUT CONTRAST  TECHNIQUE: Multiplanar, multiecho pulse sequences of the brain and surrounding  structures were obtained without intravenous contrast. Angiographic images of the head were obtained using MRA technique without contrast.  COMPARISON:  Head CT without contrast 1050 hr today. Brain MRI and MRA 03/22/2013.  FINDINGS: MRI HEAD FINDINGS  Patchy clustered cortical and white matter restricted diffusion in the right superior frontal gyrus, mostly the pre motor area (see series 5, images 23-28).  No left hemisphere or posterior fossa restricted diffusion identified.  Major intracranial vascular flow voids are stable.  Mild if any associated T2 and FLAIR hyperintensity to correspond with the acute diffusion abnormality. Underlying Patchy and confluent white matter T2 and FLAIR signal changes are stable. No acute intracranial hemorrhage identified. No midline shift, mass effect, or evidence of intracranial mass lesion.  No ventriculomegaly. Stable pituitary gland, cervicomedullary junction, and visualized cervical spine. Normal bone marrow signal.  New mild right mastoid effusion. Rightward gaze deviation. Stable paranasal sinuses. Visualized scalp soft tissues are within normal limits.  MRA HEAD FINDINGS  Stable antegrade flow in the posterior circulation with dominant distal right vertebral artery. Widespread basilar artery irregularity does appear progressed along with mild multifocal basilar artery stenosis. The moderate stenosis just proximal to the SCA origins appears stable.  Widespread bilateral PCA irregularity with high-grade distal P2 stenoses appears stable. Posterior communicating arteries are diminutive or absent.  Stable antegrade flow in the left ICA siphon. No left ICA stenosis identified. Left carotid terminus MCA, and left ACA origins remain within normal limits. Increased irregularity of the visualized left ACA branches, but with Stable distal left ACA flow. Moderate irregularity of the left MCA branches is stable, with stable distal left MCA flow.  Stable antegrade flow in the right ICA siphon but increased irregularity in the vertical P each wrists segment now resulting in mild stenosis. Stable right ICA terminus infundibulum. Patent right ICA terminus with stable right MCA and ACA origins.  New high-grade irregularity and attenuation of flow signal in the low right ACA distal A2 segment, with poor distal right ACA flow signal.  Proximal right M2 branch irregularity and stenosis appears not significantly changed since 2014. Right MCA branch flow signal appears stable, with no interval major right MCA branch occlusion identified.  IMPRESSION: 1. Patchy acute infarcts in the right ACA / MCA watershed territory involving the superior frontal gyrus, mostly in the pre motor area. No mass effect or hemorrhage. 2. New distal right ACA occlusion and high-grade irregularity of the A2 segment, concordant with #1. Right  MCA M2 branch atherosclerosis and flow appears stable since 2014. 3. Interval mild progression of right ICA siphon and basilar artery atherosclerosis, but no other new or progressed hemodynamically significant stenosis compared to the 2014 MRA.  Electronically Signed: By: Lars Pinks M.D. On: 12/17/2013 15:18   Mr Brain Wo Contrast  12/17/2013   ADDENDUM REPORT: 12/17/2013 15:25  ADDENDUM: Study discussed by telephone with Dr. Carmin Muskrat on 12/17/2013 at 15:20 hours.   Electronically Signed   By: Lars Pinks M.D.   On: 12/17/2013 15:25   12/17/2013   CLINICAL DATA:  80 year old female with increasing weakness and confusion. Initial encounter. History of small right inferior cerebellar infarct in 2014.  EXAM: MRI HEAD WITHOUT CONTRAST  MRA HEAD WITHOUT CONTRAST  TECHNIQUE: Multiplanar, multiecho pulse sequences of the brain and surrounding structures were obtained without intravenous contrast. Angiographic images of the head were obtained using MRA technique without contrast.  COMPARISON:  Head CT without contrast 1050 hr today. Brain MRI and MRA 03/22/2013.  FINDINGS: MRI HEAD FINDINGS  Patchy clustered cortical and white matter restricted diffusion in the right superior frontal gyrus, mostly the pre motor area (see series 5, images 23-28).  No left hemisphere or posterior fossa restricted diffusion identified.  Major intracranial vascular flow voids are stable.  Mild if any associated T2 and FLAIR hyperintensity to correspond with the acute diffusion abnormality. Underlying Patchy and confluent white matter T2 and FLAIR signal changes are stable. No acute intracranial hemorrhage identified. No midline shift, mass effect, or evidence of intracranial mass lesion. No ventriculomegaly. Stable pituitary gland, cervicomedullary junction, and visualized cervical spine. Normal bone marrow signal.  New mild right mastoid effusion. Rightward gaze deviation. Stable paranasal sinuses. Visualized scalp soft tissues are  within normal limits.  MRA HEAD FINDINGS  Stable antegrade flow in the posterior circulation with dominant distal right vertebral artery. Widespread basilar artery irregularity does appear progressed along with mild multifocal basilar artery stenosis. The moderate stenosis just proximal to the SCA origins appears stable.  Widespread bilateral PCA irregularity with high-grade distal P2 stenoses appears stable. Posterior communicating arteries are diminutive or absent.  Stable antegrade flow in the left ICA siphon. No left ICA stenosis identified. Left carotid terminus MCA, and left ACA origins remain within normal limits. Increased irregularity of the visualized left ACA branches, but with Stable distal left ACA flow. Moderate irregularity of the left MCA branches is stable, with stable distal left MCA flow.  Stable antegrade flow in the right ICA siphon but increased irregularity in the vertical P each wrists segment now resulting in mild stenosis. Stable right ICA terminus infundibulum. Patent right ICA terminus with stable right MCA and ACA origins.  New high-grade irregularity and attenuation of flow signal in the low right ACA distal A2 segment, with poor distal right ACA flow signal.  Proximal right M2 branch irregularity and stenosis appears not significantly changed since 2014. Right MCA branch flow signal appears stable, with no interval major right MCA branch occlusion identified.  IMPRESSION: 1. Patchy acute infarcts in the right ACA / MCA watershed territory involving the superior frontal gyrus, mostly in the pre motor area. No mass effect or hemorrhage. 2. New distal right ACA occlusion and high-grade irregularity of the A2 segment, concordant with #1. Right MCA M2 branch atherosclerosis and flow appears stable since 2014. 3. Interval mild progression of right ICA siphon and basilar artery atherosclerosis, but no other new or progressed hemodynamically significant stenosis compared to the 2014 MRA.   Electronically Signed: By: Lars Pinks M.D. On: 12/17/2013 15:18    Microbiology: No results found for this or any previous visit (from the past 240 hour(s)).   Labs: Basic Metabolic Panel:  Recent Labs Lab 12/17/13 1034 12/18/13 0509 12/19/13 0545  NA 143 138 139  K 3.6* 3.8 3.8  CL 105 101 103  CO2 23 19 22   GLUCOSE 96 143* 101*  BUN 11 10 11   CREATININE 0.46* 0.42* 0.50  CALCIUM 9.8 9.7 9.7   Liver Function Tests:  Recent Labs Lab 12/17/13 1034 12/18/13 0509  AST 13 14  ALT 7 5  ALKPHOS 59 61  BILITOT 0.7 0.8  PROT 7.9 7.8  ALBUMIN 3.3* 3.3*   No results found for this basename: LIPASE, AMYLASE,  in the last 168 hours No results found for this basename: AMMONIA,  in the last 168 hours CBC:  Recent Labs Lab 12/17/13 1034 12/18/13 0509 12/19/13 0545  WBC 8.7 11.7* 9.4  NEUTROABS 5.7  --   --   HGB 13.5 13.1 12.2  HCT 40.5 38.7 36.5  MCV 80.7 81.0 81.3  PLT 908* 929* 853*   Cardiac Enzymes:  Recent Labs Lab 12/17/13 1034  TROPONINI <0.30   BNP: BNP (last 3 results) No results found for this basename: PROBNP,  in the last 8760 hours CBG:  Recent Labs Lab 12/20/13 1154 12/20/13 1656 12/20/13 2027 12/20/13 2352 12/21/13 0437  GLUCAP 96 99 108* 108* 107*       Signed:  Knut Rondinelli N  [facility] 12/21/2013, 10:47 AM

## 2013-12-21 NOTE — Progress Notes (Signed)
Pt's nephew contacted bedside RN. He is aware of pt's current situation and the plan to try [facility] again tomorrow if she responds appropriately. He agrees with this plan and will come to the hospital tomorrow to speak with the pt as well.

## 2013-12-21 NOTE — Clinical Social Work Note (Signed)
Discharge summary has been faxed to [facility]. Discharge packet complete and placed on pt's shadow chart. Pt's nephew/POA, Mena Goes, updated regarding pt's discharge disposition. Transportation to be arranged via EMS ([facility]).  [place] Winkelman, MSW, [facility] Licensed Clinical Social Worker [phone] and [phone]

## 2013-12-21 NOTE — Clinical Social Work Psychosocial (Signed)
Clinical Social Work Department BRIEF PSYCHOSOCIAL ASSESSMENT 12/21/2013  Patient:  Cristina Patton, Cristina Patton     Account Number:  [id number]     Admit date:  12/17/2013  Clinical Social Worker:  Delrae Sawyers  Date/Time:  12/21/2013 12:22 PM  Referred by:  Physician  Date Referred:  12/21/2013 Referred for  SNF Placement   Other Referral:   none.   Interview type:  Family Other interview type:   CSW spoke with pt's nephew/POA, Mena Goes [phone]), regarding discharge disposition.    PSYCHOSOCIAL DATA Living Status:  ALONE Admitted from facility:   Level of care:   Primary support name:  Mena Goes Primary support relationship to patient:  FAMILY Degree of support available:   Strong support system.    CURRENT CONCERNS Current Concerns  Post-Acute Placement   Other Concerns:   none.    SOCIAL WORK ASSESSMENT / PLAN CSW consulted for SNF placement at time of discharge. CSW spoke with pt's nephew/POA regarding discharge disposition. Pt's nephew informed CSW pt is from home alone with 24/hour care. Pt's nephew stated pt's 86/hour is provided by family and friends. Pt's nephew stated family preference for either [facility] or [facility] SN[facility] at time of discharge.    CSW to continue to follow and assist with discharge planning needs.   Assessment/plan status:  Psychosocial Support/Ongoing Assessment of Needs Other assessment/ plan:   none.   Information/referral to community resources:   [facility] bed offers.    PATIENT'S/FAMILY'S RESPONSE TO PLAN OF CARE: Pt's nephew understanding and agreeable to CSW plan of care. Pt's nephew expressed no further questions or concerns at this time.       Lubertha Sayres, MSW, [facility] Licensed Clinical Social Worker [phone] and [phone]

## 2013-12-21 NOTE — Progress Notes (Signed)
Occupational Therapy Treatment Patient Details Name: Cristina Patton MRN: [id number] DOB: April 19, 1927 Today's Date: 12/21/2013    History of present illness Pt admitted with AMS.  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 Pt is difficult to arouse presently.  Work up is proceeding,.   OT comments  Pt with incr arousal with repositioning. Rn aware pt in chair and entering room. Pt required pillows to position in upright posture for chair sitting. Pt without breakfast PO intake and advised RN of poor oral lip closure during session. Pt more aroused and could attempt eating in this position.    Follow Up Recommendations  SNF;Supervision/Assistance - 24 hour    Equipment Recommendations  Wheelchair cushion (measurements OT);Wheelchair (measurements OT);Hospital bed;Other (comment)    Recommendations for Other Services      Precautions / Restrictions Precautions Precautions: Fall Precaution Comments: risk for subluxation of LT shoulder       Mobility Bed Mobility Overal bed mobility: +2 for physical assistance;Needs Assistance Bed Mobility: Supine to Sit     Supine to sit: +2 for physical assistance;Total assist     General bed mobility comments: no (A) and aroused only once EOB  Transfers Overall transfer level: Needs assistance Equipment used: 2 person hand held assist Transfers: Sit to/from [facility] Sit to Stand: +2 physical assistance;Total assist Stand pivot transfers: +2 physical assistance;Total assist       General transfer comment: pt required gait belt and pad used. pt will require hoyer pad    Balance Overall balance assessment: Needs assistance Sitting-balance support: No upper extremity supported;Feet supported Sitting balance-Leahy Scale: Zero Sitting balance - Comments: pt with posterior lean and posterior pelvic tilt. Pt with very kyphotic posture Postural control: Posterior lean Standing balance support: No  upper extremity supported;During functional activity Standing balance-Leahy Scale: Zero                     ADL Overall ADL's : Needs assistance/impaired Eating/Feeding: Maximal assistance Eating/Feeding Details (indicate cue type and reason): pt with poor oral secretions today. pt holding fluid in her mouth. pt opening mouth to talk and pools of fluid draining out. Pt required yonker use by therapist twice (Question risk for aspiration ??) Grooming: Total assistance                                 General ADL Comments: Pt required total +2 (A) for bed mobility and aroused with repositioning. pt states "be careful. thats how people loose arms" pt then with decr response to therapist questions. pt total +2 total (A) to transfer to chair. pt with no (A)      Vision                     Perception     Praxis      Cognition   Behavior During Therapy: Flat affect Overall Cognitive Status: Impaired/Different from baseline Area of Impairment: Orientation Orientation Level: Disoriented to;Person ("my home")              General Comments: pt with very few verbalizations. pt reports being at home. pt reports "yes" . pt not response    Extremity/Trunk Assessment               Exercises     Shoulder Instructions       General Comments      Pertinent Vitals/ Pain  Pain Assessment:  (none stated)  Home Living                                          Prior Functioning/Environment              Frequency Min 2X/week     Progress Toward Goals  OT Goals(current goals can now be found in the care plan section)  Progress towards OT goals: Not progressing toward goals - comment (arousal level)  Acute Rehab OT Goals Patient Stated Goal: none stated OT Goal Formulation: Patient unable to participate in goal setting Time For Goal Achievement: 01/02/14 Potential to Achieve Goals: Good ADL Goals Pt Will Perform  Eating: with mod assist;sitting Pt Will Perform Grooming: with mod assist;sitting Pt Will Perform Upper Body Bathing: with mod assist;sitting Pt Will Transfer to Toilet: bedside commode;with +2 assist;with mod assist Additional ADL Goal #1: pt will demonstrate total + 2 min (A) with bed mobility HOB elevated  Plan Discharge plan remains appropriate    Co-evaluation                 End of Session Equipment Utilized During Treatment: Gait belt   Activity Tolerance Patient limited by fatigue   Patient Left in chair;with call bell/phone within reach;with chair alarm set;with nursing/sitter in room   Nurse Communication Mobility status;Need for lift equipment;Precautions        Time: 3300-7622 OT Time Calculation (min): 23 min  Charges: OT General Charges $OT Visit: 1 Procedure OT Treatments $Therapeutic Activity: 23-37 mins  Peri Maris 12/21/2013, 11:33 AM Pager: [phone]

## 2013-12-21 NOTE — Progress Notes (Signed)
Attempted to contact [employer] and pt's nephew (her POA) regarding the discontinuation of pt's transfer orders due to the inability of transport to arouse the pt. The RN who was to receive the pt was notified, but the [facility] admission coordinator was not reached. The nephew could not be contacted at this time either.

## 2013-12-21 NOTE — Progress Notes (Signed)
[facility] PROGRESS NOTE  CARRISSA TAITANO XMD:[id number] DOB: 1926/08/19 DOA: 12/17/2013 PCP: PROVIDER NOT IN SYSTEM  Patient is again less responsive than usual   -will hold d/c ; monitor overnight     Kinnie Feil  [facility] Pager [phone]. If 7PM-7AM, please contact night-coverage at [email or web address], password [facility] 12/21/2013, 4:05 PM  LOS: 4 days

## 2013-12-21 NOTE — Progress Notes (Signed)
[facility] arrived to transport pt to [employer]. Pt minimally responsive, even with sternal rubbing. When pt did open her eyes, her gaze deviated to the right. Agricultural consultant notified. MD notified, and canceled the transfer order. Pt will remain at the hospital for one more night.

## 2013-12-21 NOTE — Clinical Social Work Placement (Addendum)
Clinical Social Work Department CLINICAL SOCIAL WORK PLACEMENT NOTE 12/21/2013  Patient:  Cristina Patton, Cristina Patton  Account Number:  [id number] Admit date:  12/17/2013  Clinical Social Worker:  Delrae Sawyers  Date/time:  12/19/2013 12:34 PM  Clinical Social Work is seeking post-discharge placement for this patient at the following level of care:   [place]   (*CSW will update this form in Epic as items are completed)   12/19/2013  Patient/family provided with [place] Department of Clinical Social Work's list of facilities offering this level of care within the geographic area requested by the patient (or if unable, by the patient's family).  12/19/2013  Patient/family informed of their freedom to choose among providers that offer the needed level of care, that participate in Medicare, Medicaid or managed care program needed by the patient, have an available bed and are willing to accept the patient.  12/19/2013  Patient/family informed of [facility]' ownership interest in [facility], as well as of the fact that they are under no obligation to receive care at this facility.  PASARR submitted to EDS on  PASARR number received on   FL2 transmitted to all facilities in geographic area requested by pt/family on  12/19/2013 FL2 transmitted to all facilities within larger geographic area on   Patient informed that his/her managed care company has contracts with or will negotiate with  certain facilities, including the following:     Patient/family informed of bed offers received:  12/21/2013 Patient chooses bed at [facility] Physician recommends and patient chooses bed at    Patient to be transferred to [place] on 12/22/13- Blima Rich, [place] Patient to be transferred to facility by [facility] Patient and family notified of transfer on 12/22/13- Blima Rich, [place] Name of family member notified:  Mena Goes, pt's  nephew and POA  The following physician request were entered in Epic:   Additional Comments: PASARR previously existing.  Lubertha Sayres, MSW, [facility] Licensed Clinical Social Worker [phone] and [phone]

## 2013-12-21 NOTE — Clinical Social Work Note (Signed)
CSW notified by [facility] and RN regarding discharge cancellation due to change in medical condition. CSW updated admissions liaison with [facility] regarding cancelled discharged. RN to update pt's nephew/POA regarding information above. CSW has left appropriate handoff with weekend CSW to follow for possible weekend discharge.  Lubertha Sayres, MSW, [facility] Licensed Clinical Social Worker [phone] and [phone]

## 2013-12-22 LAB — GLUCOSE, CAPILLARY
GLUCOSE-CAPILLARY: 106 mg/dL — AB (ref 70–99)
Glucose-Capillary: 103 mg/dL — ABNORMAL HIGH (ref 70–99)
Glucose-Capillary: 150 mg/dL — ABNORMAL HIGH (ref 70–99)

## 2013-12-22 NOTE — Progress Notes (Signed)
[facility] PROGRESS NOTE  Cristina Patton DTO:[id number] DOB: 1926-06-25 DOA: 12/17/2013 PCP: PROVIDER NOT IN SYSTEM  Assessment/Plan: 80 year old female with history of stroke hypertension that is poorly controlled, ischemic strokes with residual left hemiparesis Nov 2014 presented to the emergency department with increasing weakness over the last few days and found to have acute CVA  -Patient also has community-acquired pneumonia versus aspiration pneumonia.   Patient is beck to her baseline, but has intermittent episodes of slow response   Hospital Course:  1. Acute CVA, L sided hemiparesis; mental status slow to response  -MRI of the brain:Patchy acute infarcts in the right ACA / MCA watershed territory involving the superior frontal gyrus, mostly in the pre motor area  -MRA of the head: New distal right ACA occlusion and high-grade irregularity of the A2 segment,  -Hemoglobin A1c 5.5, LDL 104  -PT and OT consults: SNF  -Speech evaluated Dys 1; patient declined PEG  -Neurology consulted appreciated, recommended aspirin and Plavix for 3 weeks, thereafter Plavix alone  -Patient will likely need nursing home placement, currently lives at home alone.  2. Accelerated hypertension  -Allowed permissive hypertension with hydralazine as needed, resumed amlodipine, added low dose BB  3. Community acquired pneumonia versus possible aspiration pneumonia  -Patient currently on ceftriaxone and azithromycin; afebrile; changed to PO atx to complete Rx  4. Thrombocytosis Possibly acute phase reactant  -Will likely require workup as outpatient  -Patient has no history of smoking  5. Recent right inguinal hernia  -Diagnosed in May of 2015  -Surgery was offered at that time, patient declined.  -Appears stable  Study Conclusions  - Left ventricle: The cavity size was normal. Systolic function was normal. The estimated ejection fraction was in the range of 55% to 60%. Wall motion was  normal; there were no regional wall motion abnormalities. - Aortic valve: Moderate thickening and calcification, consistent with sclerosis. There was trivial regurgitation. - Mitral valve: Severely calcified annulus. There was trivial regurgitation. - Tricuspid valve: There was trivial regurgitation. - Pulmonic valve: There was mild regurgitation.  D/w patient, confirmed; Pt declined PEG  -Pt is DNR  Code Status: DNR Family Communication: d/w patient, updated DPOA, yesterday (indicate person spoken with, relationship, and if by phone, the number) Disposition Plan: SNF 24-48 hrs    Consultants:  Neurology   Procedures:  none  Antibiotics:  Ceftriaxone azith 817<<<<   (indicate start date, and stop date if known)  HPI/Subjective: alert  Objective: Filed Vitals:   12/22/13 0958  BP: 177/74  Pulse: 106  Temp: 97.6 F (36.4 C)  Resp: 20   No intake or output data in the 24 hours ending 12/22/13 1114 Filed Weights   12/17/13 1604  Weight: 52.2 kg (115 lb 1.3 oz)    Exam:   General:  alert  Cardiovascular: s1,s2 rrr  Respiratory: CTA BL  Abdomen: soft, nt,nd   Musculoskeletal: no LE edema   Data Reviewed: Basic Metabolic Panel:  Recent Labs Lab 12/17/13 1034 12/18/13 0509 12/19/13 0545  NA 143 138 139  K 3.6* 3.8 3.8  CL 105 101 103  CO2 23 19 22   GLUCOSE 96 143* 101*  BUN 11 10 11   CREATININE 0.46* 0.42* 0.50  CALCIUM 9.8 9.7 9.7   Liver Function Tests:  Recent Labs Lab 12/17/13 1034 12/18/13 0509  AST 13 14  ALT 7 5  ALKPHOS 59 61  BILITOT 0.7 0.8  PROT 7.9 7.8  ALBUMIN 3.3* 3.3*   No results found for this  basename: LIPASE, AMYLASE,  in the last 168 hours No results found for this basename: AMMONIA,  in the last 168 hours CBC:  Recent Labs Lab 12/17/13 1034 12/18/13 0509 12/19/13 0545  WBC 8.7 11.7* 9.4  NEUTROABS 5.7  --   --   HGB 13.5 13.1 12.2  HCT 40.5 38.7 36.5  MCV 80.7 81.0 81.3  PLT 908* 929* 853*    Cardiac Enzymes:  Recent Labs Lab 12/17/13 1034  TROPONINI <0.30   BNP (last 3 results) No results found for this basename: PROBNP,  in the last 8760 hours CBG:  Recent Labs Lab 12/21/13 1538 12/21/13 1648 12/21/13 1941 12/21/13 2348 12/22/13 0358  GLUCAP 112* 117* 110* 94 106*    No results found for this or any previous visit (from the past 240 hour(s)).   Studies: No results found.  Scheduled Meds: .  stroke: mapping our early stages of recovery book   Does not apply Once  . amLODipine  5 mg Oral Daily  . amoxicillin-clavulanate  1 tablet Oral Q12H  . aspirin  325 mg Oral Daily   Or  . aspirin  300 mg Rectal Daily  . atorvastatin  20 mg Oral q1800  . clopidogrel  75 mg Oral Q breakfast  . enoxaparin (LOVENOX) injection  40 mg Subcutaneous Q24H  . metoprolol tartrate  25 mg Oral BID   Continuous Infusions: . dextrose 5 % and 0.9% NaCl      Principal Problem:   Acute CVA (cerebrovascular accident) Active Problems:   Dehydration   Acute encephalopathy   CAP (community acquired pneumonia)   History of stroke   Accelerated hypertension   Left hemiparesis   Thrombocythemia    Time spent: >35 minutes     Kinnie Feil  [facility] Pager [phone]. If 7PM-7AM, please contact night-coverage at [email or web address], password [email or web address] 12/22/2013, 11:14 AM  LOS: 5 days

## 2013-12-22 NOTE — Progress Notes (Signed)
Patient is been d/c to the [facility] facility, report called to the receiving nurse Lincoln Brigham. Condition fair.

## 2013-12-22 NOTE — Progress Notes (Signed)
Patient is medically stable for D/C to [facility] today. Clinical Education officer, museum (CSW) prepared D/C packet and arranged EMS for transport. CSW contacted patient's nephew and HPOA Mena Goes and made him aware of above. CSW contacted [facility] admissions coordinator Lexine Baton and who reported that patient can come today. Per Lexine Baton she is going to room 505. Patient and RN aware of above. Please reconsult if further social work needs arise. CSW signing off.   Blima Rich, [place] Weekend CSW [phone]

## 2013-12-26 ENCOUNTER — Encounter: Payer: Self-pay | Admitting: Internal Medicine

## 2013-12-26 ENCOUNTER — Non-Acute Institutional Stay (SKILLED_NURSING_FACILITY): Payer: Medicare Other | Admitting: Internal Medicine

## 2013-12-26 DIAGNOSIS — K4091 Unilateral inguinal hernia, without obstruction or gangrene, recurrent: Secondary | ICD-10-CM

## 2013-12-26 DIAGNOSIS — D473 Essential (hemorrhagic) thrombocythemia: Secondary | ICD-10-CM

## 2013-12-26 DIAGNOSIS — I639 Cerebral infarction, unspecified: Secondary | ICD-10-CM

## 2013-12-26 DIAGNOSIS — I10 Essential (primary) hypertension: Secondary | ICD-10-CM

## 2013-12-26 DIAGNOSIS — J189 Pneumonia, unspecified organism: Secondary | ICD-10-CM

## 2013-12-26 DIAGNOSIS — I6350 Cerebral infarction due to unspecified occlusion or stenosis of unspecified cerebral artery: Secondary | ICD-10-CM

## 2013-12-26 DIAGNOSIS — K4090 Unilateral inguinal hernia, without obstruction or gangrene, not specified as recurrent: Secondary | ICD-10-CM | POA: Insufficient documentation

## 2013-12-26 NOTE — Progress Notes (Signed)
MRN: [id number] Name: Cristina Patton  Sex: female Age: 80 y.o. DOB: 08-04-1926  [place] #: [name] Facility/Room: [place] Level Of Care: SNF Provider: Inocencio Homes D Emergency Contacts: Extended Emergency Contact Information Primary Emergency Contact: [name] Phone: [phone] Relation: Friend Secondary Emergency Contact: Garbers,Kim Address: [address]          [place], [place] [address] [name] of [place] Work Phone: [phone] Mobile Phone: [phone] Relation: Niece  Code Status: DNR  Allergies: Review of patient's allergies indicates no known allergies.  Chief Complaint  Patient presents with  . nursing home admission    HPI: Patient is 80 y.o. female who is admitted s/p acute CVA with L hemiparesis for OT/PT.  Past Medical History  Diagnosis Date  . Hypertension   . CVA (cerebral infarction)   . Stroke     History reviewed. No pertinent past surgical history.    Medication List       This list is accurate as of: 12/26/13  7:36 PM.  Always use your most recent med list.               amLODipine 5 MG tablet  Commonly known as:  NORVASC  Take 1 tablet (5 mg total) by mouth daily.     amoxicillin-clavulanate 875-125 MG per tablet  Commonly known as:  AUGMENTIN  Take 1 tablet by mouth 2 (two) times daily.     atorvastatin 20 MG tablet  Commonly known as:  LIPITOR  Take 1 tablet (20 mg total) by mouth daily at 6 PM.     clopidogrel 75 MG tablet  Commonly known as:  PLAVIX  Take 1 tablet (75 mg total) by mouth daily.     metoprolol tartrate 25 MG tablet  Commonly known as:  LOPRESSOR  Take 1 tablet (25 mg total) by mouth 2 (two) times daily.     polyethylene glycol packet  Commonly known as:  MIRALAX / GLYCOLAX  Take 17 g by mouth daily.        No orders of the defined types were placed in this encounter.     There is no immunization history on file for this patient.  History   Substance Use Topics  . Smoking status: Never Smoker   . Smokeless tobacco: Not on file  . Alcohol Use: No    Family history is noncontributory    Review of Systems  DATA OBTAINED: from patient GENERAL: Feels well no fevers, fatigue, appetite changes SKIN: No itching, rash or wounds EYES: No eye pain, redness, discharge EARS: No earache, tinnitus, change in hearing NOSE: No congestion, drainage or bleeding  MOUTH/THROAT: No mouth or tooth pain, No sore throat RESPIRATORY: No cough, wheezing, SOB CARDIAC: No chest pain, palpitations, lower extremity edema  GI: No abdominal pain, No N/V/D or constipation, No heartburn or reflux  GU: No dysuria, frequency or urgency, or incontinence  MUSCULOSKELETAL: No unrelieved bone/joint pain NEUROLOGIC: No headache, dizziness  PSYCHIATRIC: No overt anxiety or sadness. Sleeps well. No behavior issue.   Filed Vitals:   12/26/13 1030  BP: 177/79  Pulse: 89  Temp: 97.2 F (36.2 C)  Resp: 20    Physical Exam  GENERAL APPEARANCE: Alert, conversant. Appropriately groomed. No acute distress.  SKIN: No diaphoresis rash HEAD: Normocephalic, atraumatic  EYES: Conjunctiva/lids clear. Pupils round, reactive. EOMs intact.  EARS: External exam WNL, canals clear. Hearing grossly normal.  NOSE: No deformity or discharge.  MOUTH/THROAT: Lips w/o lesions  RESPIRATORY: Breathing is even, unlabored. Lung sounds are clear   CARDIOVASCULAR: Heart RRR no murmurs, rubs or gallops. No peripheral edema.   GASTROINTESTINAL: Abdomen is soft, non-tender, not distended w/ normal bowel sounds GENITOURINARY: Bladder non tender, not distended  MUSCULOSKELETAL: No abnormal joints or musculature NEUROLOGIC: Oriented X3. Cranial nerves 2-12 grossly intact; L hemiparesis PSYCHIATRIC: Mood and affect appropriate to situation, no behavioral issues  Patient Active Problem List   Diagnosis Date Noted  . Inguinal hernia 12/26/2013  . Acute CVA (cerebrovascular  accident) 12/18/2013  . CAP (community acquired pneumonia) 12/17/2013  . History of stroke 12/17/2013  . Accelerated hypertension 12/17/2013  . Left hemiparesis 12/17/2013  . Essential thrombocythemia 12/17/2013  . Unspecified constipation 06/18/2013  . Urinary retention 05/10/2013  . UTI (urinary tract infection) 04/03/2013  . GI bleed 04/02/2013  . Acute blood loss anemia 04/02/2013  . CVA (cerebral infarction) 04/02/2013  . Protein-calorie malnutrition, severe 03/21/2013  . Dehydration 03/20/2013  . Acute renal failure 03/20/2013  . Leukocytosis, unspecified 03/20/2013  . Acute encephalopathy 03/20/2013  . Fall at home- ? syncope. 03/20/2013  . Hypernatremia 03/20/2013  . HTN (hypertension) 03/20/2013  . Hypothermia 03/20/2013  . Transaminitis 03/20/2013    CBC    Component Value Date/Time   WBC 9.4 12/19/2013 0545   RBC 4.49 12/19/2013 0545   HGB 12.2 12/19/2013 0545   HCT 36.5 12/19/2013 0545   PLT 853* 12/19/2013 0545   MCV 81.3 12/19/2013 0545   LYMPHSABS 2.2 12/17/2013 1034   MONOABS 0.9 12/17/2013 1034   EOSABS 0.0 12/17/2013 1034   BASOSABS 0.0 12/17/2013 1034    CMP     Component Value Date/Time   NA 139 12/19/2013 0545   K 3.8 12/19/2013 0545   CL 103 12/19/2013 0545   CO2 22 12/19/2013 0545   GLUCOSE 101* 12/19/2013 0545   BUN 11 12/19/2013 0545   CREATININE 0.50 12/19/2013 0545   CALCIUM 9.7 12/19/2013 0545   PROT 7.8 12/18/2013 0509   ALBUMIN 3.3* 12/18/2013 0509   AST 14 12/18/2013 0509   ALT 5 12/18/2013 0509   ALKPHOS 61 12/18/2013 0509   BILITOT 0.8 12/18/2013 0509   GFRNONAA 86* 12/19/2013 0545   GFRAA >90 12/19/2013 0545    Assessment and Plan  Acute CVA (cerebrovascular accident) L sided hemiparesis; mental status slow to response  -MRI of the brain:Patchy acute infarcts in the right ACA / MCA watershed territory involving the superior frontal gyrus, mostly in the pre motor area  -MRA of the head: New distal right ACA occlusion and high-grade irregularity  of the A2 segment,  -Hemoglobin A1c 5.5, LDL 104  -PT and OT consults: SNF  -Speech evaluated Dys 1; patient declined PEG  -Neurology consulted appreciated, recommended aspirin and Plavix for 3 weeks, thereafter Plavix alone  -Patient will likely need nursing home placement, currently lives at home alone   Accelerated hypertension Allowed permissive hypertension with hydralazine as needed, resumed amlodipine, added low dose BB; today a little too high, inc norvasc to 10 mg    CAP (community acquired pneumonia) Patient was on ceftriaxone and azithromycin; afebrile; changed to PO augmentin to complete Rx   Essential thrombocythemia Possibly acute phase reactant  -Will likely require workup as outpatient  -Patient has no history of smoking    Inguinal hernia -Diagnosed in May of 2015  -Surgery was offered at that time, patient declined.  -Appears stable      Hennie Duos, MD

## 2013-12-26 NOTE — Assessment & Plan Note (Signed)
Patient was on ceftriaxone and azithromycin; afebrile; changed to PO augmentin to complete Rx

## 2013-12-26 NOTE — Assessment & Plan Note (Signed)
-  Diagnosed in May of 2015  -Surgery was offered at that time, patient declined.  -Appears stable

## 2013-12-26 NOTE — Assessment & Plan Note (Signed)
L sided hemiparesis; mental status slow to response  -MRI of the brain:Patchy acute infarcts in the right ACA / MCA watershed territory involving the superior frontal gyrus, mostly in the pre motor area  -MRA of the head: New distal right ACA occlusion and high-grade irregularity of the A2 segment,  -Hemoglobin A1c 5.5, LDL 104  -PT and OT consults: SNF  -Speech evaluated Dys 1; patient declined PEG  -Neurology consulted appreciated, recommended aspirin and Plavix for 3 weeks, thereafter Plavix alone  -Patient will likely need nursing home placement, currently lives at home alone

## 2013-12-26 NOTE — Assessment & Plan Note (Signed)
Possibly acute phase reactant  -Will likely require workup as outpatient  -Patient has no history of smoking

## 2013-12-26 NOTE — Assessment & Plan Note (Signed)
Allowed permissive hypertension with hydralazine as needed, resumed amlodipine, added low dose BB; today a little too high, inc norvasc to 10 mg

## 2014-01-14 ENCOUNTER — Emergency Department (HOSPITAL_COMMUNITY): Payer: Medicare Other

## 2014-01-14 ENCOUNTER — Non-Acute Institutional Stay (SKILLED_NURSING_FACILITY): Payer: Medicare Other | Admitting: Internal Medicine

## 2014-01-14 ENCOUNTER — Inpatient Hospital Stay (HOSPITAL_COMMUNITY)
Admission: EM | Admit: 2014-01-14 | Discharge: 2014-01-19 | DRG: 064 | Disposition: A | Discharge status: Skilled Nursing Facility | Payer: Medicare Other | Attending: Internal Medicine | Admitting: Internal Medicine

## 2014-01-14 ENCOUNTER — Encounter (HOSPITAL_COMMUNITY): Payer: Self-pay | Admitting: Emergency Medicine

## 2014-01-14 DIAGNOSIS — Z7982 Long term (current) use of aspirin: Secondary | ICD-10-CM

## 2014-01-14 DIAGNOSIS — I672 Cerebral atherosclerosis: Secondary | ICD-10-CM | POA: Diagnosis present

## 2014-01-14 DIAGNOSIS — Z515 Encounter for palliative care: Secondary | ICD-10-CM

## 2014-01-14 DIAGNOSIS — E785 Hyperlipidemia, unspecified: Secondary | ICD-10-CM | POA: Diagnosis present

## 2014-01-14 DIAGNOSIS — I6350 Cerebral infarction due to unspecified occlusion or stenosis of unspecified cerebral artery: Principal | ICD-10-CM | POA: Diagnosis present

## 2014-01-14 DIAGNOSIS — R4182 Altered mental status, unspecified: Secondary | ICD-10-CM | POA: Diagnosis not present

## 2014-01-14 DIAGNOSIS — R5381 Other malaise: Secondary | ICD-10-CM | POA: Diagnosis present

## 2014-01-14 DIAGNOSIS — R29810 Facial weakness: Secondary | ICD-10-CM | POA: Diagnosis present

## 2014-01-14 DIAGNOSIS — G9340 Encephalopathy, unspecified: Secondary | ICD-10-CM | POA: Diagnosis present

## 2014-01-14 DIAGNOSIS — D473 Essential (hemorrhagic) thrombocythemia: Secondary | ICD-10-CM | POA: Diagnosis present

## 2014-01-14 DIAGNOSIS — D72829 Elevated white blood cell count, unspecified: Secondary | ICD-10-CM

## 2014-01-14 DIAGNOSIS — E43 Unspecified severe protein-calorie malnutrition: Secondary | ICD-10-CM | POA: Diagnosis present

## 2014-01-14 DIAGNOSIS — Z79899 Other long term (current) drug therapy: Secondary | ICD-10-CM | POA: Diagnosis not present

## 2014-01-14 DIAGNOSIS — R627 Adult failure to thrive: Secondary | ICD-10-CM | POA: Diagnosis present

## 2014-01-14 DIAGNOSIS — I69959 Hemiplegia and hemiparesis following unspecified cerebrovascular disease affecting unspecified side: Secondary | ICD-10-CM

## 2014-01-14 DIAGNOSIS — I159 Secondary hypertension, unspecified: Secondary | ICD-10-CM

## 2014-01-14 DIAGNOSIS — R532 Functional quadriplegia: Secondary | ICD-10-CM | POA: Diagnosis present

## 2014-01-14 DIAGNOSIS — F0390 Unspecified dementia without behavioral disturbance: Secondary | ICD-10-CM | POA: Diagnosis present

## 2014-01-14 DIAGNOSIS — I10 Essential (primary) hypertension: Secondary | ICD-10-CM

## 2014-01-14 DIAGNOSIS — E860 Dehydration: Secondary | ICD-10-CM

## 2014-01-14 DIAGNOSIS — R531 Weakness: Secondary | ICD-10-CM

## 2014-01-14 DIAGNOSIS — E876 Hypokalemia: Secondary | ICD-10-CM | POA: Diagnosis present

## 2014-01-14 DIAGNOSIS — Z681 Body mass index (BMI) 19 or less, adult: Secondary | ICD-10-CM | POA: Diagnosis not present

## 2014-01-14 DIAGNOSIS — I498 Other specified cardiac arrhythmias: Secondary | ICD-10-CM | POA: Diagnosis present

## 2014-01-14 DIAGNOSIS — E870 Hyperosmolality and hypernatremia: Secondary | ICD-10-CM

## 2014-01-14 DIAGNOSIS — Z66 Do not resuscitate: Secondary | ICD-10-CM | POA: Diagnosis not present

## 2014-01-14 DIAGNOSIS — R5383 Other fatigue: Secondary | ICD-10-CM

## 2014-01-14 DIAGNOSIS — R1310 Dysphagia, unspecified: Secondary | ICD-10-CM | POA: Diagnosis present

## 2014-01-14 DIAGNOSIS — I639 Cerebral infarction, unspecified: Secondary | ICD-10-CM

## 2014-01-14 DIAGNOSIS — Z8673 Personal history of transient ischemic attack (TIA), and cerebral infarction without residual deficits: Secondary | ICD-10-CM

## 2014-01-14 DIAGNOSIS — Z7902 Long term (current) use of antithrombotics/antiplatelets: Secondary | ICD-10-CM | POA: Diagnosis not present

## 2014-01-14 LAB — COMPREHENSIVE METABOLIC PANEL
ALK PHOS: 75 U/L (ref 39–117)
ALT: 8 U/L (ref 0–35)
AST: 22 U/L (ref 0–37)
Albumin: 3.5 g/dL (ref 3.5–5.2)
Anion gap: 19 — ABNORMAL HIGH (ref 5–15)
BILIRUBIN TOTAL: 1 mg/dL (ref 0.3–1.2)
BUN: 19 mg/dL (ref 6–23)
CHLORIDE: 104 meq/L (ref 96–112)
CO2: 23 mEq/L (ref 19–32)
Calcium: 10.4 mg/dL (ref 8.4–10.5)
Creatinine, Ser: 0.49 mg/dL — ABNORMAL LOW (ref 0.50–1.10)
GFR calc Af Amer: >90 mL/min (ref >90)
GFR, EST NON AFRICAN AMERICAN: 86 mL/min — AB (ref >90)
Glucose, Bld: 126 mg/dL — ABNORMAL HIGH (ref 70–99)
POTASSIUM: 3.7 meq/L (ref 3.7–5.3)
Sodium: 146 mEq/L (ref 137–147)
Total Protein: 8.5 g/dL — ABNORMAL HIGH (ref 6.0–8.3)

## 2014-01-14 LAB — I-STAT TROPONIN, ED: Troponin i, poc: 0.01 ng/mL (ref 0.00–0.08)

## 2014-01-14 LAB — I-STAT CHEM 8, ED
BUN: 19 mg/dL (ref 6–23)
CHLORIDE: 109 meq/L (ref 96–112)
CREATININE: 0.6 mg/dL (ref 0.50–1.10)
Calcium, Ion: 1.25 mmol/L (ref 1.13–1.30)
GLUCOSE: 133 mg/dL — AB (ref 70–99)
HEMATOCRIT: 47 % — AB (ref 36.0–46.0)
Hemoglobin: 16 g/dL — ABNORMAL HIGH (ref 12.0–15.0)
POTASSIUM: 3.4 meq/L — AB (ref 3.7–5.3)
Sodium: 147 mEq/L (ref 137–147)
TCO2: 29 mmol/L (ref 0–100)

## 2014-01-14 LAB — CBC WITH DIFFERENTIAL/PLATELET
Basophils Absolute: 0 10*3/uL (ref 0.0–0.1)
Basophils Relative: 0 % (ref 0–1)
EOS PCT: 0 % (ref 0–5)
Eosinophils Absolute: 0 10*3/uL (ref 0.0–0.7)
HCT: 43 % (ref 36.0–46.0)
Hemoglobin: 14.3 g/dL (ref 12.0–15.0)
LYMPHS PCT: 11 % — AB (ref 12–46)
Lymphs Abs: 1.4 10*3/uL (ref 0.7–4.0)
MCH: 27.4 pg (ref 26.0–34.0)
MCHC: 33.3 g/dL (ref 30.0–36.0)
MCV: 82.4 fL (ref 78.0–100.0)
MONO ABS: 1.9 10*3/uL — AB (ref 0.1–1.0)
Monocytes Relative: 14 % — ABNORMAL HIGH (ref 3–12)
NEUTROS ABS: 10.2 10*3/uL — AB (ref 1.7–7.7)
Neutrophils Relative %: 75 % (ref 43–77)
Platelets: 747 10*3/uL — ABNORMAL HIGH (ref 150–400)
RBC: 5.22 MIL/uL — ABNORMAL HIGH (ref 3.87–5.11)
RDW: 17.2 % — AB (ref 11.5–15.5)
WBC: 13.5 10*3/uL — AB (ref 4.0–10.5)

## 2014-01-14 LAB — URINE MICROSCOPIC-ADD ON

## 2014-01-14 LAB — URINALYSIS, ROUTINE W REFLEX MICROSCOPIC
GLUCOSE, UA: NEGATIVE mg/dL
HGB URINE DIPSTICK: NEGATIVE
Ketones, ur: >80 mg/dL — AB
LEUKOCYTES UA: NEGATIVE
Nitrite: NEGATIVE
PH: 6 (ref 5.0–8.0)
PROTEIN: 100 mg/dL — AB
Specific Gravity, Urine: 1.023 (ref 1.005–1.030)
Urobilinogen, UA: 0.2 mg/dL (ref 0.0–1.0)

## 2014-01-14 LAB — PROTIME-INR
INR: 1.08 (ref 0.00–1.49)
Prothrombin Time: 14 seconds (ref 11.6–15.2)

## 2014-01-14 LAB — I-STAT CG4 LACTIC ACID, ED: Lactic Acid, Venous: 1.95 mmol/L (ref 0.5–2.2)

## 2014-01-14 LAB — CBG MONITORING, ED: Glucose-Capillary: 125 mg/dL — ABNORMAL HIGH (ref 70–99)

## 2014-01-14 LAB — APTT: APTT: 35 s (ref 24–37)

## 2014-01-14 MED ORDER — SODIUM CHLORIDE 0.9 % IV SOLN
1000.0000 mL | INTRAVENOUS | Status: DC
  Administered 2014-01-14: 1000 mL via INTRAVENOUS

## 2014-01-14 MED ORDER — AMLODIPINE BESYLATE 10 MG PO TABS
10.0000 mg | ORAL_TABLET | Freq: Every morning | ORAL | Status: DC
  Administered 2014-01-15 – 2014-01-19 (×5): 10 mg via ORAL
  Filled 2014-01-14 (×6): qty 1

## 2014-01-14 MED ORDER — STROKE: EARLY STAGES OF RECOVERY BOOK
Freq: Once | Status: DC
  Filled 2014-01-14: qty 1

## 2014-01-14 MED ORDER — SODIUM CHLORIDE 0.9 % IV SOLN: INTRAVENOUS | Status: DC

## 2014-01-14 MED ORDER — SODIUM CHLORIDE 0.9 % IJ SOLN
3.0000 mL | Freq: Two times a day (BID) | INTRAMUSCULAR | Status: DC
  Administered 2014-01-15 – 2014-01-17 (×6): 3 mL via INTRAVENOUS

## 2014-01-14 MED ORDER — SODIUM CHLORIDE 0.9 % IV SOLN: 250.0000 mL | INTRAVENOUS | Status: DC | PRN

## 2014-01-14 MED ORDER — ASPIRIN 300 MG RE SUPP
300.0000 mg | Freq: Every day | RECTAL | Status: DC
  Administered 2014-01-15 – 2014-01-16 (×2): 300 mg via RECTAL
  Filled 2014-01-14 (×2): qty 1

## 2014-01-14 MED ORDER — SODIUM CHLORIDE 0.9 % IV SOLN
1000.0000 mL | Freq: Once | INTRAVENOUS | Status: AC
  Administered 2014-01-14: 1000 mL via INTRAVENOUS

## 2014-01-14 MED ORDER — SODIUM CHLORIDE 0.9 % IJ SOLN: 3.0000 mL | INTRAMUSCULAR | Status: DC | PRN

## 2014-01-14 MED ORDER — ENOXAPARIN SODIUM 40 MG/0.4ML ~~LOC~~ SOLN
40.0000 mg | Freq: Every day | SUBCUTANEOUS | Status: DC
  Administered 2014-01-14 – 2014-01-18 (×5): 40 mg via SUBCUTANEOUS
  Filled 2014-01-14 (×5): qty 0.4

## 2014-01-14 MED ORDER — POTASSIUM CHLORIDE IN NACL 40-0.9 MEQ/L-% IV SOLN
INTRAVENOUS | Status: AC
  Administered 2014-01-15: 75 mL/h via INTRAVENOUS
  Filled 2014-01-14: qty 1000

## 2014-01-14 MED ORDER — HYDRALAZINE HCL 20 MG/ML IJ SOLN
10.0000 mg | Freq: Four times a day (QID) | INTRAMUSCULAR | Status: DC | PRN
  Administered 2014-01-15: 10 mg via INTRAVENOUS
  Filled 2014-01-14: qty 1

## 2014-01-14 NOTE — ED Notes (Signed)
Patient returning from MRI at this time, NAD noted, patient will respond to verbal stimuli, resting with eyes closed

## 2014-01-14 NOTE — Progress Notes (Signed)
Patient arrived to 4N room 7 via stretcher from ED. VSS. Telemetry placed. Patient lethargic but able to communicate name and how she is feeling. Will continue to monitor patient closely. Burnell Blanks, RN

## 2014-01-14 NOTE — ED Provider Notes (Signed)
CSN: [id number]     Arrival date & time 01/14/14  1438 History   First MD Initiated Contact with Patient 01/14/14 1506     Chief Complaint  Patient presents with  . Altered Mental Status     (Consider location/radiation/quality/duration/timing/severity/associated sxs/prior Treatment) HPI  Cristina Patton is a 80 y.o. female (with DO NOT RESUSCITATE as per chart review) with past medical history significant for hypertension, CVA brought in by EMS from skilled nursing facility with decreased level of consciousness. Patient with past history significant for multiple prior CVAs (admitted one month ago for CVA [infarcts in the right [place] territory] and PNA ?aspiration; Pt also had  ischemic strokes with residual left hemiparesis Nov 2014) with left-sided hemiparesis and dysphasia patient is somnolent but rousable. Level V caveat secondary to altered mental status.    Past Medical History  Diagnosis Date  . Hypertension   . CVA (cerebral infarction)   . Stroke    History reviewed. No pertinent past surgical history. No family history on file. History  Substance Use Topics  . Smoking status: Never Smoker   . Smokeless tobacco: Not on file  . Alcohol Use: No   OB History   Grav Para Term Preterm Abortions TAB SAB Ect Mult Living                 Review of Systems  Unable to perform ROS: Mental status change       Allergies  Review of patient's allergies indicates no known allergies.  Home Medications   Prior to Admission medications   Medication Sig Start Date End Date Taking? Authorizing Provider  amLODipine (NORVASC) 10 MG tablet Take 10 mg by mouth every morning.   Yes Historical Provider, MD  aspirin 325 MG EC tablet Take 325 mg by mouth every morning.   Yes Historical Provider, MD  atorvastatin (LIPITOR) 20 MG tablet Take 1 tablet (20 mg total) by mouth daily at 6 PM. 12/21/13  Yes Kinnie Feil, MD  clopidogrel (PLAVIX) 75 MG tablet Take 1 tablet  (75 mg total) by mouth daily. 04/06/13  Yes Bobby Rumpf York, PA-C  metoprolol tartrate (LOPRESSOR) 25 MG tablet Take 1 tablet (25 mg total) by mouth 2 (two) times daily. 12/21/13  Yes Kinnie Feil, MD  polyethylene glycol (MIRALAX / GLYCOLAX) packet Take 17 g by mouth daily.   Yes Historical Provider, MD   BP 158/59  Pulse 104  Temp(Src) 97.8 F (36.6 C) (Oral)  Resp 14  SpO2 98% Physical Exam  Nursing note and vitals reviewed. Constitutional: No distress.  Appears chronically deconditioned.  HENT:  Head: Normocephalic.  Right eye ptosis with left-sided hemiparesis.  Eyes: Conjunctivae and EOM are normal.  Cardiovascular: Normal rate.   Pulmonary/Chest: Effort normal. No stridor.  Musculoskeletal: Normal range of motion.  Neurological: She is alert.  Psychiatric: She has a normal mood and affect.    ED Course  Procedures (including critical care time) Labs Review Labs Reviewed  CBC WITH DIFFERENTIAL - Abnormal; Notable for the following:    WBC 13.5 (*)    RBC 5.22 (*)    RDW 17.2 (*)    Platelets 747 (*)    Neutro Abs 10.2 (*)    Lymphocytes Relative 11 (*)    Monocytes Relative 14 (*)    Monocytes Absolute 1.9 (*)    All other components within normal limits  COMPREHENSIVE METABOLIC PANEL - Abnormal; Notable for the following:    Glucose, Bld 126 (*)  Creatinine, Ser 0.49 (*)    Total Protein 8.5 (*)    GFR calc non Af Amer 86 (*)    Anion gap 19 (*)    All other components within normal limits  URINALYSIS, ROUTINE W REFLEX MICROSCOPIC - Abnormal; Notable for the following:    Color, Urine AMBER (*)    Bilirubin Urine MODERATE (*)    Ketones, ur >80 (*)    Protein, ur 100 (*)    All other components within normal limits  URINE MICROSCOPIC-ADD ON - Abnormal; Notable for the following:    Bacteria, UA FEW (*)    All other components within normal limits  CBG MONITORING, ED - Abnormal; Notable for the following:    Glucose-Capillary 125 (*)    All other  components within normal limits  I-STAT CHEM 8, ED - Abnormal; Notable for the following:    Potassium 3.4 (*)    Glucose, Bld 133 (*)    Hemoglobin 16.0 (*)    HCT 47.0 (*)    All other components within normal limits  CULTURE, BLOOD (ROUTINE X 2)  CULTURE, BLOOD (ROUTINE X 2)  PROTIME-INR  APTT  HEMOGLOBIN A1C  LIPID PANEL  CBC  BASIC METABOLIC PANEL  I-STAT TROPOININ, ED  I-STAT CG4 LACTIC ACID, ED    Imaging Review Ct Head Wo Contrast  01/14/2014   CLINICAL DATA:  Altered mental status. Decreased level of consciousness. Recent infarcts.  EXAM: CT HEAD WITHOUT CONTRAST  TECHNIQUE: Contiguous axial images were obtained from the base of the skull through the vertex without intravenous contrast.  COMPARISON:  MRI brain 12/17/2013  FINDINGS: Asymmetric subcortical white matter hypoattenuation in the high left frontal lobe on images 20 and 21 of series 201 is concerning for an acute nonhemorrhagic infarct. Mild generalized atrophy and white matter disease is otherwise stable. There is no hemorrhage or mass. Remote infarcts of the basal ganglia are noted bilaterally.  The ventricles are proportionate to the degree of atrophy. No significant extra-axial fluid collection is present. Atherosclerotic changes are again noted within the cavernous carotid arteries.  IMPRESSION: 1. Asymmetric focal subcortical area of white matter hypoattenuation in the high left frontal lobe is concerning for an acute nonhemorrhagic infarct. MRI could be used for confirmation. 2. Otherwise stable atrophy and white matter disease. 3. Atherosclerosis.   Electronically Signed   By: Lawrence Santiago M.D.   On: 01/14/2014 16:15   [name] Wo Contrast  01/14/2014   CLINICAL DATA:  80 year old female with decreased level of consciousness. High blood pressure.  EXAM: MRI HEAD WITHOUT CONTRAST  MRA HEAD WITHOUT CONTRAST  TECHNIQUE: Multiplanar, multiecho pulse sequences of the brain and surrounding structures were obtained  without intravenous contrast. Angiographic images of the head were obtained using MRA technique without contrast.  COMPARISON:  01/14/2014 CT.  12/17/2013 MR.  FINDINGS: MRI HEAD FINDINGS  New left centrum semiovale nonhemorrhagic infarcts.  Subacute right centrum semiovale infarcts.  Remote left occipital lobe infarct.  Prominent small vessel disease type changes.  No intracranial hemorrhage.  No intracranial mass lesion noted on this unenhanced exam.  Global atrophy without hydrocephalus.  Partially empty non expanded sella incidentally noted.  Partial opacification right mastoid air cells without obstructing mass seen causing eustachian tube dysfunction.  MRA HEAD FINDINGS  Fusiform ectasia pre cavernous segment right internal carotid artery. Mild narrowing and irregularity  Ectatic left internal carotid artery distal vertical cervical segment.  Mild ectasia and slight irregularity with minimal narrowing involving portions of the  cavernous segment of the internal carotid artery bilaterally.  Mild narrowing supraclinoid segment left internal carotid artery.  Moderate to marked tandem stenosis A2 segment anterior cerebral artery greater on the right.  Moderate narrowing proximal M2 segment and marked narrowing M3 segment right middle cerebral artery branches. Mild to moderate narrowing left middle cerebral artery branches.  Left vertebral artery ends in a posterior inferior cerebellar artery distribution.  Ectatic right vertebral artery without significant narrowing.  Nonvisualized right posterior inferior cerebellar artery.  Mild to moderate tandem stenosis basilar artery.  Nonvisualized left anterior inferior cerebellar artery.  Moderate to marked narrowing proximal left superior cerebellar artery.  Moderate to marked tandem stenosis portions of the posterior cerebral artery bilaterally.  No aneurysm visualized.  IMPRESSION: MRI HEAD:  New left centrum semiovale nonhemorrhagic infarcts.  Subacute right centrum  semiovale infarcts.  Remote left occipital lobe infarct.  Prominent small vessel disease type changes.  No intracranial hemorrhage.  Partial opacification right mastoid air cells without obstructing mass seen causing eustachian tube dysfunction.  MRA HEAD FINDINGS  Intracranial atherosclerotic type changes most notable involving medium size vessels and branch vessels but also involving the basilar artery and supraclinoid segment left internal internal carotid artery as detailed above.   Electronically Signed   By: Chauncey Cruel M.D.   On: 01/14/2014 20:57   Mr Brain Wo Contrast  01/14/2014   CLINICAL DATA:  80 year old female with decreased level of consciousness. High blood pressure.  EXAM: MRI HEAD WITHOUT CONTRAST  MRA HEAD WITHOUT CONTRAST  TECHNIQUE: Multiplanar, multiecho pulse sequences of the brain and surrounding structures were obtained without intravenous contrast. Angiographic images of the head were obtained using MRA technique without contrast.  COMPARISON:  01/14/2014 CT.  12/17/2013 MR.  FINDINGS: MRI HEAD FINDINGS  New left centrum semiovale nonhemorrhagic infarcts.  Subacute right centrum semiovale infarcts.  Remote left occipital lobe infarct.  Prominent small vessel disease type changes.  No intracranial hemorrhage.  No intracranial mass lesion noted on this unenhanced exam.  Global atrophy without hydrocephalus.  Partially empty non expanded sella incidentally noted.  Partial opacification right mastoid air cells without obstructing mass seen causing eustachian tube dysfunction.  MRA HEAD FINDINGS  Fusiform ectasia pre cavernous segment right internal carotid artery. Mild narrowing and irregularity  Ectatic left internal carotid artery distal vertical cervical segment.  Mild ectasia and slight irregularity with minimal narrowing involving portions of the cavernous segment of the internal carotid artery bilaterally.  Mild narrowing supraclinoid segment left internal carotid artery.  Moderate to  marked tandem stenosis A2 segment anterior cerebral artery greater on the right.  Moderate narrowing proximal M2 segment and marked narrowing M3 segment right middle cerebral artery branches. Mild to moderate narrowing left middle cerebral artery branches.  Left vertebral artery ends in a posterior inferior cerebellar artery distribution.  Ectatic right vertebral artery without significant narrowing.  Nonvisualized right posterior inferior cerebellar artery.  Mild to moderate tandem stenosis basilar artery.  Nonvisualized left anterior inferior cerebellar artery.  Moderate to marked narrowing proximal left superior cerebellar artery.  Moderate to marked tandem stenosis portions of the posterior cerebral artery bilaterally.  No aneurysm visualized.  IMPRESSION: MRI HEAD:  New left centrum semiovale nonhemorrhagic infarcts.  Subacute right centrum semiovale infarcts.  Remote left occipital lobe infarct.  Prominent small vessel disease type changes.  No intracranial hemorrhage.  Partial opacification right mastoid air cells without obstructing mass seen causing eustachian tube dysfunction.  MRA HEAD FINDINGS  Intracranial atherosclerotic type changes most notable involving  medium size vessels and branch vessels but also involving the basilar artery and supraclinoid segment left internal internal carotid artery as detailed above.   Electronically Signed   By: Chauncey Cruel M.D.   On: 01/14/2014 20:57   Dg Chest Port 1 View  01/14/2014   CLINICAL DATA:  Altered mental status.  EXAM: PORTABLE CHEST - 1 VIEW  COMPARISON:  12/17/2013  FINDINGS: Patient is rotated to the right. Lungs are adequately inflated without focal consolidation or effusion. Cardiomediastinal silhouette and remainder the exam is unchanged.  IMPRESSION: No active disease.   Electronically Signed   By: Marin Olp M.D.   On: 01/14/2014 16:34     EKG Interpretation   Date/Time:  Monday January 14 2014 17:04:38 EDT Ventricular Rate:  113 PR  Interval:  109 QRS Duration: 73 QT Interval:  312 QTC Calculation: 428 R Axis:   38 Text Interpretation:  Sinus tachycardia RAE, consider biatrial enlargement  RSR' in V1 or V2, probably normal variant Probable LVH with secondary  repol abnrm ST depr, consider ischemia, inferior leads since last tracing  no significant change Confirmed by Eulis Foster  MD, ELLIOTT ([id number]) on 01/14/2014  5:10:35 PM      MDM   Final diagnoses:  Altered mental status, unspecified altered mental status type    Filed Vitals:   01/14/14 1945 01/14/14 2057 01/14/14 2145 01/14/14 2230  BP: 147/66 165/72 150/68 158/59  Pulse: 100 102 99 104  Temp:    97.8 F (36.6 C)  TempSrc:    Oral  Resp: 17 16 17 14   SpO2: 96% 97% 96% 98%    Medications  amLODipine (NORVASC) tablet 10 mg (not administered)   stroke: mapping our early stages of recovery book (not administered)  enoxaparin (LOVENOX) injection 40 mg (40 mg Subcutaneous Given 01/14/14 2343)  sodium chloride 0.9 % injection 3 mL (3 mLs Intravenous Not Given 01/14/14 2230)  sodium chloride 0.9 % injection 3 mL (not administered)  0.9 %  sodium chloride infusion (not administered)  hydrALAZINE (APRESOLINE) injection 10 mg (not administered)  aspirin suppository 300 mg (not administered)  0.9 % NaCl with KCl 40 mEq / L  infusion (75 mL/hr Intravenous New Bag/Given 01/15/14 0018)  0.9 %  sodium chloride infusion (0 mLs Intravenous Stopped 01/14/14 1915)    Cristina Patton is a 80 y.o. female presenting with altered mental status, decreased level of consciousness. Patient is mildly tachycardic in the 110s. Borderline elevated temperature at 99.3 orally. Rectal temperature ordered. Patient will be given a 500 cc bolus. Blood cultures will be drawn. Normal lactic acid however she has a white blood cell count of 13.5.  Head CT shows a lesion with hypoattenuation in the high left frontal lobe that is concerning for acute nonhemorrhagic infarct.  MRI/MRA shows  atherosclerotic changes in the medium-sized vessels, no acute ischemic event.  Patient will be admitted to a telemetry bed under the care of private hospitalist Dr. Charlies Silvers.    Monico Blitz, PA-C 01/15/14 [phone]

## 2014-01-14 NOTE — ED Notes (Signed)
Per facility, patient at a decreased level of consciousness today.  Patient with previous cva with right sided weakness and dysphagia, Per facility patient less arouseable and could not eat even with speech therapy assisting. Food had to removed with spoon. Patient following commands at this time

## 2014-01-14 NOTE — H&P (Signed)
[facility] History and Physical  Cristina Patton PPJ:[id number] DOB: 11/13/1926 DOA: 01/14/2014  Referring physician: ER physician PCP: PROVIDER NOT IN SYSTEM   Chief Complaint: lethargy   HPI:  80 y.o. female with dementia, HTN, recent admission fro CVA and aspiration PNA in August 2015, residual left hemiparesis since November 2014, presented from [facility] for evaluation of worsening lethargy. Please note that pt is obtunded at the time of the arrival to the [facility] and currently unable to provide any history, most of the details obtained from ED doctor and available records from SNF. Per record review, pt noted to be progressively more lethargic since earlier in the day, no reported fevers, chills, no noted dyspnea, no abd concerns reported.   In ED, pt noted to be lethargic, unable to follow any commands and unable to answer questions. CT head notable for ssymmetric focal subcortical area of white matter hypoattenuation in the high left frontal lobe is concerning for an acute nonhemorrhagic infarct. MRI requested and currently pending. VSS with low grade fever 99.3 F. Blood work notable for mild leukocytosis 13.5, K 3.4. Neurology consulted by ED doctor and [facility] asked to admit for further evaluation. Telemetry bed requested.    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 - last SLP on recent hospitalization rec's dys I diet - SLP requested this time - aspiration precautions  Accelerated hypertension  - Allowed permissive  hypertension - pt on Norvasc and Metoprolol at SNF, will provide Hydralazine IV as needed for now  - resume home meds once pt passes swallow eval  Leukocytosis and low grade fever - UA and CXR with no clear infectious etiology - hold off on ABX and monitor VS closely - repeat CBC in AM Hypokalemia - mild  - supplement and repeat BMP in AM Functional quadriplegia - from progressive failure to thrive and deconditioning - PT/OT once pt able to participate Dehydration  - evident on exam and by hemoconcentration  - will provide gentle hydration  Thrombocytosis - appears to be chronic and present on last admission as well   DVT prophylaxis: Lovenox SUBQ   Radiological Exams on Admission: Ct Head Wo Contrast  01/14/2014   1. Asymmetric focal subcortical area of white matter hypoattenuation in the high left frontal lobe is concerning for an acute nonhemorrhagic infarct. MRI could be used for confirmation. 2. Otherwise stable atrophy and white matter disease.   Dg Chest Port 1 View  01/14/2014   No active disease.      EKG: pending   Code Status: Full Family Communication: No family at bedside  Disposition Plan: Admit for further evaluation  Cristina Lenz, MD  [facility] Pager [phone]  Review of Systems:  Unable to obtain due to AMS     Past Medical History  Diagnosis Date  . Hypertension   . CVA (cerebral infarction)   . Stroke    History reviewed. No pertinent past surgical history. Social History:  reports that she has never smoked. She does not have any smokeless  tobacco history on file. She reports that she does not drink alcohol or use illicit drugs.  No Known Allergies  Family History: Unable to obtain due to AMS    Prior to Admission medications   Medication Sig Start Date End Date Taking? Authorizing Provider  amLODipine (NORVASC) 10 MG tablet Take 10 mg by mouth every morning.   Yes Historical Provider, MD  aspirin 325 MG EC tablet Take 325 mg by  mouth every morning.   Yes Historical Provider, MD  atorvastatin (LIPITOR) 20 MG tablet Take 1 tablet (20 mg total) by mouth daily at 6 PM. 12/21/13  Yes Kinnie Feil, MD  clopidogrel (PLAVIX) 75 MG tablet Take 1 tablet (75 mg total) by mouth daily. 04/06/13  Yes Bobby Rumpf York, PA-C  metoprolol tartrate (LOPRESSOR) 25 MG tablet Take 1 tablet (25 mg total) by mouth 2 (two) times daily. 12/21/13  Yes Kinnie Feil, MD  polyethylene glycol (MIRALAX / GLYCOLAX) packet Take 17 g by mouth daily.   Yes Historical Provider, MD   Physical Exam: Filed Vitals:   01/14/14 1700 01/14/14 1705 01/14/14 1730 01/14/14 1800  BP: 164/77 164/77 166/70 150/65  Pulse: 109 114 107 101  Temp:  98.6 F (37 C)    TempSrc:  Rectal    Resp: 11 25 17 18   SpO2: 98% 98% 97% 97%    Physical Exam  Constitutional: Appears somnolent but can be aroused, NAD, ill and frail  HENT: Normocephalic. No tonsillar erythema or exudates Eyes: Conjunctivae and EOM are normal. PERRLA, no scleral icterus.  Neck: Normal ROM. Neck supple. No JVD. No tracheal deviation. No thyromegaly.  CVS: Regular rhythm, tachycardic, no gallops, no carotid bruit.  Pulmonary: Effort and breath sounds normal, no stridor, diminished breath sounds at bases  Abdominal: Soft. BS +,  no distension, tenderness, rebound or guarding.  Musculoskeletal: No edema and no tenderness.  Lymphadenopathy: No lymphadenopathy noted, cervical, inguinal. Neuro: Somnolent but can be aroused, left hemiparesis, moving right UE and LE spontaneously  Skin: Skin is warm and dry. No rash noted. Not diaphoretic. No erythema. No pallor.  Psychiatric: Unable to assess due to AMS   Labs on Admission:  Basic Metabolic Panel:  Recent Labs Lab 01/14/14 1511 01/14/14 1611  NA 146 147  K 3.7 3.4*  CL 104 109  CO2 23  --   GLUCOSE 126* 133*  BUN 19 19  CREATININE 0.49* 0.60  CALCIUM 10.4  --    Liver Function Tests:  Recent Labs Lab 01/14/14 1511  AST 22  ALT 8   ALKPHOS 75  BILITOT 1.0  PROT 8.5*  ALBUMIN 3.5   CBC:  Recent Labs Lab 01/14/14 1511 01/14/14 1611  WBC 13.5*  --   NEUTROABS 10.2*  --   HGB 14.3 16.0*  HCT 43.0 47.0*  MCV 82.4  --   PLT 747*  --    CBG:  Recent Labs Lab 01/14/14 1648  GLUCAP 125*    If 7PM-7AM, please contact night-coverage [email or web address] Password [facility] 01/14/2014, 8:55 PM

## 2014-01-14 NOTE — ED Notes (Signed)
Pt moved from hallway to room; myself and Scott, RN undressed pt, placed on monitor, continuous pulse oximetry and blood pressure cuff

## 2014-01-14 NOTE — ED Provider Notes (Signed)
  Face-to-face evaluation   History: Sent here for AMS, apparently acute.  Physical exam: Obtunded. Attempts to respond to questions, but no intelligible response. Normal grip and toe mvt. Bilateral. Heart- Tachycardic. Lungs - clear anteriority.  Medical screening examination/treatment/procedure(s) were conducted as a shared visit with non-physician practitioner(s) and myself.  I personally evaluated the patient during the encounter  Richarda Blade, MD 01/16/14 [phone]

## 2014-01-15 DIAGNOSIS — I158 Other secondary hypertension: Secondary | ICD-10-CM

## 2014-01-15 DIAGNOSIS — E43 Unspecified severe protein-calorie malnutrition: Secondary | ICD-10-CM

## 2014-01-15 DIAGNOSIS — D72829 Elevated white blood cell count, unspecified: Secondary | ICD-10-CM

## 2014-01-15 DIAGNOSIS — D473 Essential (hemorrhagic) thrombocythemia: Secondary | ICD-10-CM

## 2014-01-15 LAB — CBC
HCT: 37 % (ref 36.0–46.0)
Hemoglobin: 12.3 g/dL (ref 12.0–15.0)
MCH: 26.9 pg (ref 26.0–34.0)
MCHC: 33.2 g/dL (ref 30.0–36.0)
MCV: 81 fL (ref 78.0–100.0)
PLATELETS: 720 10*3/uL — AB (ref 150–400)
RBC: 4.57 MIL/uL (ref 3.87–5.11)
RDW: 17.2 % — AB (ref 11.5–15.5)
WBC: 9.9 10*3/uL (ref 4.0–10.5)

## 2014-01-15 LAB — LIPID PANEL
Cholesterol: 142 mg/dL (ref 0–200)
HDL: 63 mg/dL (ref >39)
LDL Cholesterol: 65 mg/dL (ref 0–99)
Total CHOL/HDL Ratio: 2.3 RATIO
Triglycerides: 70 mg/dL (ref <150)
VLDL: 14 mg/dL (ref 0–40)

## 2014-01-15 LAB — BASIC METABOLIC PANEL
ANION GAP: 16 — AB (ref 5–15)
BUN: 16 mg/dL (ref 6–23)
CO2: 22 meq/L (ref 19–32)
Calcium: 9.3 mg/dL (ref 8.4–10.5)
Chloride: 111 mEq/L (ref 96–112)
Creatinine, Ser: 0.42 mg/dL — ABNORMAL LOW (ref 0.50–1.10)
GFR calc Af Amer: >90 mL/min (ref >90)
GFR calc non Af Amer: >90 mL/min (ref >90)
Glucose, Bld: 112 mg/dL — ABNORMAL HIGH (ref 70–99)
Potassium: 3.9 mEq/L (ref 3.7–5.3)
SODIUM: 149 meq/L — AB (ref 137–147)

## 2014-01-15 LAB — HEMOGLOBIN A1C
HEMOGLOBIN A1C: 5.7 % — AB (ref <5.7)
Mean Plasma Glucose: 117 mg/dL — ABNORMAL HIGH (ref <117)

## 2014-01-15 NOTE — Evaluation (Signed)
Clinical/Bedside Swallow Evaluation Patient Details  Name: Cristina Patton MRN: [id number] Date of Birth: [birth year]  Today's Date: 01/15/2014 Time:  -     Past Medical History:  Past Medical History  Diagnosis Date  . Hypertension   . CVA (cerebral infarction)   . Stroke    Past Surgical History: History reviewed. No pertinent past surgical history. HPI:  Cristina Patton is a 80 y.o. female with a past medical history of stroke, hypertension, which appears to be very poorly controlled, who resides at home. Her stroke was in November of 2014 involving the right inferior cerebellar area. There was a nonhemorrhagic infarct. Following this stroke she spent some time in a nursing facility. Subsequently, she was able to go back home. She presents to the hospital after she was brought in by family due to increasing weakness over the last few days. Patient appears to have some degree of confusion and is unable to provide much history.  Previous notes reveal that patient  was able to move her left upper and lower extremities, despite the stroke, but seems unable to do so now. Her nursing home reports that she is dysphagic and has been less arouseable. She could not eat, even with speech therapy assisting. Food had to removed with spoon. Pt suspect for aspiration pneumonia. MRI shows new left centrum semiovale nonhemorrhagic infarcts, subacute right centrum semiovale infarcts, remote left occipital lobe infarct, prominent small vessel disease type changes, partial opacification right mastoid air cells without obstructing, mass seen causing eustachian tube dysfunction.    Assessment / Plan / Recommendation Clinical Impression  Pt demonstrates a mild cognitive based dysphagia. She does not follow commands and needs max assistance for feeding. She shows no signs of aspiration across PO trials, but does demonstrate oral holding, piecemeal swallowing, and tongue pumping with solids (rather than  chewing). When given maximal verbal and visual cues to swallow she does not respond. Contextual cues (a dry spoon in in her mouth) are effective in triggering a swallow. Due to her impaired mastication, holding, piecemeal swallowing , recommend a dysphagia 1 (puree) diet with thin liquids.  SLP to follow up for texture advancement and diet tolerance.    Aspiration Risk  Mild    Diet Recommendation Dysphagia 1 (Puree)   Liquid Administration via: Cup;Straw Medication Administration: Crushed with puree Supervision: Full supervision/cueing for compensatory strategies Compensations: Small sips/bites;Follow solids with liquid Postural Changes and/or Swallow Maneuvers: Seated upright 90 degrees    Other  Recommendations     Follow Up Recommendations  24 hour supervision/assistance    Frequency and Duration min 2x/week  2 weeks   Pertinent Vitals/Pain none    SLP Swallow Goals     Swallow Study Prior Functional Status       General Date of Onset: 01/14/14 HPI: Cristina Patton is a 80 y.o. female with a past medical history of stroke, hypertension, which appears to be very poorly controlled, who resides at home. Her stroke was in November of 2014 involving the right inferior cerebellar area. There was a nonhemorrhagic infarct. Following this stroke she spent some time in a nursing facility. Subsequently, she was able to go back home. She presents to the hospital after she was brought in by family due to increasing weakness over the last few days. Patient appears to have some degree of confusion and is unable to provide much history.  Previous notes reveal that patient  was able to move her left upper and lower extremities, despite the  stroke, but seems unable to do so now. Her nursing home reports that she is dysphagic and has been less arouseable. She could not eat, even with speech therapy assisting. Food had to removed with spoon. Pt suspect for aspiration pneumonia. MRI shows new left  centrum semiovale nonhemorrhagic infarcts, subacute right centrum semiovale infarcts, remote left occipital lobe infarct, prominent small vessel disease type changes, partial opacification right mastoid air cells without obstructing, mass seen causing eustachian tube dysfunction.  Type of Study: Bedside swallow evaluation Diet Prior to this Study: NPO Temperature Spikes Noted: No Respiratory Status: Room air Behavior/Cognition: Lethargic;Pleasant mood;Cooperative;Requires cueing Oral Cavity - Dentition: Adequate natural dentition Self-Feeding Abilities: Needs assist Patient Positioning: Upright in bed Baseline Vocal Quality: Clear    Oral/Motor/Sensory Function     Ice Chips Ice chips: Impaired Presentation: Spoon Oral Phase Impairments: Impaired mastication Oral Phase Functional Implications: Oral holding   Thin Liquid Thin Liquid: Impaired Presentation: Cup;Straw Oral Phase Impairments: Impaired anterior to posterior transit Oral Phase Functional Implications: Oral holding    Nectar Thick     Honey Thick     Puree Puree: Impaired Presentation: Spoon Oral Phase Impairments: Impaired anterior to posterior transit Oral Phase Functional Implications: Oral holding   Solid   GO            Eden Emms 01/15/2014,10:14 AM    Supervised and reviewed by Herbie Baltimore MA CCC-SLP

## 2014-01-15 NOTE — Progress Notes (Signed)
UR complete.  Samariya Rockhold RN, MSN 

## 2014-01-15 NOTE — Progress Notes (Addendum)
[facility]                                                                              Patient Demographics  Cristina Patton, is a 80 y.o. female, DOB - April 30, 1927, EXB:[id number]  Admit date - 01/14/2014   Admitting Physician Robbie Lis, MD  Outpatient Primary MD for the patient is PROVIDER NOT [place]  LOS - 1   Chief Complaint  Patient presents with  . Altered Mental Status      HPI on 01/14/2014 80 y.o. 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 Please note that patient was obtunded at the time of the arrival to the [facility] and was unable to provide any history, most of the details were obtained from ED doctor and available records from SNF. Per record review, patient noted to be progressively more lethargic since earlier in the day, no reported fevers, chills, no noted dyspnea, no abd concerns reported.   In ED, patient noted to be lethargic, unable to follow any commands and unable to answer questions. CT head notable for asymmetric focal subcortical area of white matter hypoattenuation in the high left frontal lobe, concerning for an acute nonhemorrhagic infarct. MRI requested and currently pending. VSS with low grade fever 99.3 F. Blood work notable for mild leukocytosis 13.5, K 3.4. Neurology consulted by ED doctor and [place] asked to admit for further evaluation. Telemetry bed requested.    Assessment & Plan   Acute Encephalopathy secondary to Acute CVA -CT head :Asymmetric focal subcortical area of white matter hypoattenuation   in the high left frontal lobe is concerning for an acute  nonhemorrhagic infarct -MRI head: New left centrum semiovale nonhemorrhagic infarcts -Neurology consulted and appreciated, pending further recommendations -LDL 65, hemoglobin A1c pending -Echocardiogram 12/19/2013: EF  55-60% -Carotid doppler 12/20/2013: 1-39% ICA stenosis bilaterally. Antegrade vertebral flow. -Currently on Aspirin supp -As an outpatient was Aspirin 325mg  and plavix 75mg  daily -Pending PT/OT/Speech eval  Severe protein calorie malnutrition -From underlying CVA and dementia -Last speech recommendation from prior hospitalization recommended dysphagia 1 diet -Pending speech evaluation at this time and continue aspiration precautions  Hypertension -Continue Norvasc and metoprolol once patient passes swallow eval -hydralazine when necessary  Leukocytosis with low-grade fever -Resolved -UA and chest x-ray show no infectious etiology -Continue to monitor  Hypokalemia -Resolved, will continue to monitor BMP  Functional quadriplegia -Complicated by failure to thrive and deconditioning -PT and OT consulted  Dehydration -Continue IV fluid -Improving (was hemoconcentrated upon admission)  Thrombocytosis, chronic  Code Status: Full  Family Communication: None at bedside   Disposition Plan: Admitted  Time Spent in minutes   30 minutes  Procedures  None  Consults   Neurology  DVT Prophylaxis  Lovenox  Lab Results  Component Value Date   PLT 720* 01/15/2014    Medications  Scheduled Meds: .  stroke: mapping our early stages of recovery book   Does not apply Once  . amLODipine  10 mg Oral q morning - 10a  . aspirin  300 mg Rectal Daily  .  enoxaparin (LOVENOX) injection  40 mg Subcutaneous QHS  . sodium chloride  3 mL Intravenous Q12H   Continuous Infusions: . 0.9 % NaCl with KCl 40 mEq / L 75 mL/hr (01/15/14 0018)   PRN Meds:.sodium chloride, hydrALAZINE, sodium chloride  Antibiotics    Anti-infectives   None      Subjective:   Cristina Patton seen and examined today.  Patient has no complaints this morning can answer some questions, very slow to respond. Denies chest pain or shortness of breath at this time.     Objective:   Filed Vitals:    01/14/14 2145 01/14/14 2230 01/15/14 0200 01/15/14 0445  BP: 150/68 158/59 158/63 172/72  Pulse: 99 104 100 102  Temp:  97.8 F (36.6 C)  97.7 F (36.5 C)  TempSrc:  Oral Axillary Axillary  Resp: 17 14 20 18   SpO2: 96% 98% 99% 100%    Wt Readings from Last 3 Encounters:  12/17/13 52.2 kg (115 lb 1.3 oz)  09/01/13 52.164 kg (115 lb)  04/03/13 56 kg (123 lb 7.3 oz)     Intake/Output Summary (Last 24 hours) at 01/15/14 0934 Last data filed at 01/15/14 0913  Gross per 24 hour  Intake      0 ml  Output      0 ml  Net      0 ml    Exam  General: Well developed, well nourished, NAD, appears stated age  HEENT: NCAT, PERRLA, Anicteic Sclera, mucous membranes moist.   Neck: Supple, no JVD, no masses  Cardiovascular: S1 S2 auscultated, no rubs, murmurs or gallops. Regular rate and rhythm.  Respiratory: Clear to auscultation bilaterally with equal chest rise  Abdomen: Soft, nontender, nondistended, + bowel sounds  Extremities: warm dry without cyanosis clubbing or edema  Neuro: AAOx3, not oriented to time.  Cannot follow commands consistently. Has mild left facial droop. Strength testing unable to complete.  Data Review   Micro Results No results found for this or any previous visit (from the past 240 hour(s)).  Radiology Reports Dg Chest 2 View  12/17/2013   CLINICAL DATA:  Altered mental status, history stroke, hypertension  EXAM: CHEST  2 VIEW  COMPARISON:  04/02/2013  FINDINGS: Upper normal heart size.  Normal mediastinal contours and pulmonary vascularity.  Rotation to the LEFT.  Mild bibasilar atelectasis.  LEFT upper lobe infiltrate present.  Remaining lungs clear.  No pleural effusion or pneumothorax.  Bones demineralized.  Atherosclerotic calcification aortic arch.  IMPRESSION: LEFT upper lobe infiltrate and mild bibasilar atelectasis.   Electronically Signed   By: Lavonia Dana M.D.   On: 12/17/2013 11:20   Ct Head Wo Contrast  01/14/2014   CLINICAL DATA:  Altered  mental status. Decreased level of consciousness. Recent infarcts.  EXAM: CT HEAD WITHOUT CONTRAST  TECHNIQUE: Contiguous axial images were obtained from the base of the skull through the vertex without intravenous contrast.  COMPARISON:  MRI brain 12/17/2013  FINDINGS: Asymmetric subcortical white matter hypoattenuation in the high left frontal lobe on images 20 and 21 of series 201 is concerning for an acute nonhemorrhagic infarct. Mild generalized atrophy and white matter disease is otherwise stable. There is no hemorrhage or mass. Remote infarcts of the basal ganglia are noted bilaterally.  The ventricles are proportionate to the degree of atrophy. No significant extra-axial fluid collection is present. Atherosclerotic changes are again noted within the cavernous carotid arteries.  IMPRESSION: 1. Asymmetric focal subcortical area of white matter hypoattenuation in the high left  frontal lobe is concerning for an acute nonhemorrhagic infarct. MRI could be used for confirmation. 2. Otherwise stable atrophy and white matter disease. 3. Atherosclerosis.   Electronically Signed   By: Lawrence Santiago M.D.   On: 01/14/2014 16:15   Ct Head Wo Contrast  12/17/2013   CLINICAL DATA:  Subjective decrease in overall activity, with increasing LEFT-sided weakness. History of prior stroke. History of hypertension.  EXAM: CT HEAD WITHOUT CONTRAST  TECHNIQUE: Contiguous axial images were obtained from the base of the skull through the vertex without contrast.  COMPARISON:  MR head 03/22/2013.  CT head 03/20/2013.  FINDINGS: No evidence for acute infarction, hemorrhage, mass lesion, hydrocephalus, or extra-axial fluid. Generalized cerebral and cerebellar atrophy. Chronic microvascular ischemic change affects the periventricular and subcortical white matter. There is a remote RIGHT cerebellar infarct which is difficult to visualize. The calvarium is intact. Vascular calcification. There is no acute sinus disease. Trace RIGHT  mastoid effusion. When technique differences are considered, similar appearance to priors.  IMPRESSION: No acute findings.  Age-related changes as described.   Electronically Signed   By: Rolla Flatten M.D.   On: 12/17/2013 11:21   Mr Jodene Nam Head Wo Contrast  01/14/2014   CLINICAL DATA:  80 year old female with decreased level of consciousness. High blood pressure.  EXAM: MRI HEAD WITHOUT CONTRAST  MRA HEAD WITHOUT CONTRAST  TECHNIQUE: Multiplanar, multiecho pulse sequences of the brain and surrounding structures were obtained without intravenous contrast. Angiographic images of the head were obtained using MRA technique without contrast.  COMPARISON:  01/14/2014 CT.  12/17/2013 MR.  FINDINGS: MRI HEAD FINDINGS  New left centrum semiovale nonhemorrhagic infarcts.  Subacute right centrum semiovale infarcts.  Remote left occipital lobe infarct.  Prominent small vessel disease type changes.  No intracranial hemorrhage.  No intracranial mass lesion noted on this unenhanced exam.  Global atrophy without hydrocephalus.  Partially empty non expanded sella incidentally noted.  Partial opacification right mastoid air cells without obstructing mass seen causing eustachian tube dysfunction.  MRA HEAD FINDINGS  Fusiform ectasia pre cavernous segment right internal carotid artery. Mild narrowing and irregularity  Ectatic left internal carotid artery distal vertical cervical segment.  Mild ectasia and slight irregularity with minimal narrowing involving portions of the cavernous segment of the internal carotid artery bilaterally.  Mild narrowing supraclinoid segment left internal carotid artery.  Moderate to marked tandem stenosis A2 segment anterior cerebral artery greater on the right.  Moderate narrowing proximal M2 segment and marked narrowing M3 segment right middle cerebral artery branches. Mild to moderate narrowing left middle cerebral artery branches.  Left vertebral artery ends in a posterior inferior cerebellar artery  distribution.  Ectatic right vertebral artery without significant narrowing.  Nonvisualized right posterior inferior cerebellar artery.  Mild to moderate tandem stenosis basilar artery.  Nonvisualized left anterior inferior cerebellar artery.  Moderate to marked narrowing proximal left superior cerebellar artery.  Moderate to marked tandem stenosis portions of the posterior cerebral artery bilaterally.  No aneurysm visualized.  IMPRESSION: MRI HEAD:  New left centrum semiovale nonhemorrhagic infarcts.  Subacute right centrum semiovale infarcts.  Remote left occipital lobe infarct.  Prominent small vessel disease type changes.  No intracranial hemorrhage.  Partial opacification right mastoid air cells without obstructing mass seen causing eustachian tube dysfunction.  MRA HEAD FINDINGS  Intracranial atherosclerotic type changes most notable involving medium size vessels and branch vessels but also involving the basilar artery and supraclinoid segment left internal internal carotid artery as detailed above.   Electronically Signed  By: Chauncey Cruel M.D.   On: 01/14/2014 20:57   [name] QB Contrast  12/17/2013   ADDENDUM REPORT: 12/17/2013 15:25  ADDENDUM: Study discussed by telephone with Dr. Carmin Muskrat on 12/17/2013 at 15:20 hours.   Electronically Signed   By: Lars Pinks M.D.   On: 12/17/2013 15:25   12/17/2013   CLINICAL DATA:  80 year old female with increasing weakness and confusion. Initial encounter. History of small right inferior cerebellar infarct in 2014.  EXAM: MRI HEAD WITHOUT CONTRAST  MRA HEAD WITHOUT CONTRAST  TECHNIQUE: Multiplanar, multiecho pulse sequences of the brain and surrounding structures were obtained without intravenous contrast. Angiographic images of the head were obtained using MRA technique without contrast.  COMPARISON:  Head CT without contrast 1050 hr today. Brain MRI and MRA 03/22/2013.  FINDINGS: MRI HEAD FINDINGS  Patchy clustered cortical and white matter restricted  diffusion in the right superior frontal gyrus, mostly the pre motor area (see series 5, images 23-28).  No left hemisphere or posterior fossa restricted diffusion identified.  Major intracranial vascular flow voids are stable.  Mild if any associated T2 and FLAIR hyperintensity to correspond with the acute diffusion abnormality. Underlying Patchy and confluent white matter T2 and FLAIR signal changes are stable. No acute intracranial hemorrhage identified. No midline shift, mass effect, or evidence of intracranial mass lesion. No ventriculomegaly. Stable pituitary gland, cervicomedullary junction, and visualized cervical spine. Normal bone marrow signal.  New mild right mastoid effusion. Rightward gaze deviation. Stable paranasal sinuses. Visualized scalp soft tissues are within normal limits.  MRA HEAD FINDINGS  Stable antegrade flow in the posterior circulation with dominant distal right vertebral artery. Widespread basilar artery irregularity does appear progressed along with mild multifocal basilar artery stenosis. The moderate stenosis just proximal to the SCA origins appears stable.  Widespread bilateral PCA irregularity with high-grade distal P2 stenoses appears stable. Posterior communicating arteries are diminutive or absent.  Stable antegrade flow in the left ICA siphon. No left ICA stenosis identified. Left carotid terminus MCA, and left ACA origins remain within normal limits. Increased irregularity of the visualized left ACA branches, but with Stable distal left ACA flow. Moderate irregularity of the left MCA branches is stable, with stable distal left MCA flow.  Stable antegrade flow in the right ICA siphon but increased irregularity in the vertical P each wrists segment now resulting in mild stenosis. Stable right ICA terminus infundibulum. Patent right ICA terminus with stable right MCA and ACA origins.  New high-grade irregularity and attenuation of flow signal in the low right ACA distal A2  segment, with poor distal right ACA flow signal.  Proximal right M2 branch irregularity and stenosis appears not significantly changed since 2014. Right MCA branch flow signal appears stable, with no interval major right MCA branch occlusion identified.  IMPRESSION: 1. Patchy acute infarcts in the right ACA / MCA watershed territory involving the superior frontal gyrus, mostly in the pre motor area. No mass effect or hemorrhage. 2. New distal right ACA occlusion and high-grade irregularity of the A2 segment, concordant with #1. Right MCA M2 branch atherosclerosis and flow appears stable since 2014. 3. Interval mild progression of right ICA siphon and basilar artery atherosclerosis, but no other new or progressed hemodynamically significant stenosis compared to the 2014 MRA.  Electronically Signed: By: Lars Pinks M.D. On: 12/17/2013 15:18   Mr Brain Wo Contrast  01/14/2014   CLINICAL DATA:  80 year old female with decreased level of consciousness. High blood pressure.  EXAM: MRI HEAD  WITHOUT CONTRAST  MRA HEAD WITHOUT CONTRAST  TECHNIQUE: Multiplanar, multiecho pulse sequences of the brain and surrounding structures were obtained without intravenous contrast. Angiographic images of the head were obtained using MRA technique without contrast.  COMPARISON:  01/14/2014 CT.  12/17/2013 MR.  FINDINGS: MRI HEAD FINDINGS  New left centrum semiovale nonhemorrhagic infarcts.  Subacute right centrum semiovale infarcts.  Remote left occipital lobe infarct.  Prominent small vessel disease type changes.  No intracranial hemorrhage.  No intracranial mass lesion noted on this unenhanced exam.  Global atrophy without hydrocephalus.  Partially empty non expanded sella incidentally noted.  Partial opacification right mastoid air cells without obstructing mass seen causing eustachian tube dysfunction.  MRA HEAD FINDINGS  Fusiform ectasia pre cavernous segment right internal carotid artery. Mild narrowing and irregularity  Ectatic  left internal carotid artery distal vertical cervical segment.  Mild ectasia and slight irregularity with minimal narrowing involving portions of the cavernous segment of the internal carotid artery bilaterally.  Mild narrowing supraclinoid segment left internal carotid artery.  Moderate to marked tandem stenosis A2 segment anterior cerebral artery greater on the right.  Moderate narrowing proximal M2 segment and marked narrowing M3 segment right middle cerebral artery branches. Mild to moderate narrowing left middle cerebral artery branches.  Left vertebral artery ends in a posterior inferior cerebellar artery distribution.  Ectatic right vertebral artery without significant narrowing.  Nonvisualized right posterior inferior cerebellar artery.  Mild to moderate tandem stenosis basilar artery.  Nonvisualized left anterior inferior cerebellar artery.  Moderate to marked narrowing proximal left superior cerebellar artery.  Moderate to marked tandem stenosis portions of the posterior cerebral artery bilaterally.  No aneurysm visualized.  IMPRESSION: MRI HEAD:  New left centrum semiovale nonhemorrhagic infarcts.  Subacute right centrum semiovale infarcts.  Remote left occipital lobe infarct.  Prominent small vessel disease type changes.  No intracranial hemorrhage.  Partial opacification right mastoid air cells without obstructing mass seen causing eustachian tube dysfunction.  MRA HEAD FINDINGS  Intracranial atherosclerotic type changes most notable involving medium size vessels and branch vessels but also involving the basilar artery and supraclinoid segment left internal internal carotid artery as detailed above.   Electronically Signed   By: Chauncey Cruel M.D.   On: 01/14/2014 20:57   Mr Brain Wo Contrast  12/17/2013   ADDENDUM REPORT: 12/17/2013 15:25  ADDENDUM: Study discussed by telephone with Dr. Carmin Muskrat on 12/17/2013 at 15:20 hours.   Electronically Signed   By: Lars Pinks M.D.   On: 12/17/2013 15:25    12/17/2013   CLINICAL DATA:  80 year old female with increasing weakness and confusion. Initial encounter. History of small right inferior cerebellar infarct in 2014.  EXAM: MRI HEAD WITHOUT CONTRAST  MRA HEAD WITHOUT CONTRAST  TECHNIQUE: Multiplanar, multiecho pulse sequences of the brain and surrounding structures were obtained without intravenous contrast. Angiographic images of the head were obtained using MRA technique without contrast.  COMPARISON:  Head CT without contrast 1050 hr today. Brain MRI and MRA 03/22/2013.  FINDINGS: MRI HEAD FINDINGS  Patchy clustered cortical and white matter restricted diffusion in the right superior frontal gyrus, mostly the pre motor area (see series 5, images 23-28).  No left hemisphere or posterior fossa restricted diffusion identified.  Major intracranial vascular flow voids are stable.  Mild if any associated T2 and FLAIR hyperintensity to correspond with the acute diffusion abnormality. Underlying Patchy and confluent white matter T2 and FLAIR signal changes are stable. No acute intracranial hemorrhage identified. No midline shift, mass effect, or  evidence of intracranial mass lesion. No ventriculomegaly. Stable pituitary gland, cervicomedullary junction, and visualized cervical spine. Normal bone marrow signal.  New mild right mastoid effusion. Rightward gaze deviation. Stable paranasal sinuses. Visualized scalp soft tissues are within normal limits.  MRA HEAD FINDINGS  Stable antegrade flow in the posterior circulation with dominant distal right vertebral artery. Widespread basilar artery irregularity does appear progressed along with mild multifocal basilar artery stenosis. The moderate stenosis just proximal to the SCA origins appears stable.  Widespread bilateral PCA irregularity with high-grade distal P2 stenoses appears stable. Posterior communicating arteries are diminutive or absent.  Stable antegrade flow in the left ICA siphon. No left ICA stenosis  identified. Left carotid terminus MCA, and left ACA origins remain within normal limits. Increased irregularity of the visualized left ACA branches, but with Stable distal left ACA flow. Moderate irregularity of the left MCA branches is stable, with stable distal left MCA flow.  Stable antegrade flow in the right ICA siphon but increased irregularity in the vertical P each wrists segment now resulting in mild stenosis. Stable right ICA terminus infundibulum. Patent right ICA terminus with stable right MCA and ACA origins.  New high-grade irregularity and attenuation of flow signal in the low right ACA distal A2 segment, with poor distal right ACA flow signal.  Proximal right M2 branch irregularity and stenosis appears not significantly changed since 2014. Right MCA branch flow signal appears stable, with no interval major right MCA branch occlusion identified.  IMPRESSION: 1. Patchy acute infarcts in the right ACA / MCA watershed territory involving the superior frontal gyrus, mostly in the pre motor area. No mass effect or hemorrhage. 2. New distal right ACA occlusion and high-grade irregularity of the A2 segment, concordant with #1. Right MCA M2 branch atherosclerosis and flow appears stable since 2014. 3. Interval mild progression of right ICA siphon and basilar artery atherosclerosis, but no other new or progressed hemodynamically significant stenosis compared to the 2014 MRA.  Electronically Signed: By: Lars Pinks M.D. On: 12/17/2013 15:18   Dg Chest Port 1 View  01/14/2014   CLINICAL DATA:  Altered mental status.  EXAM: PORTABLE CHEST - 1 VIEW  COMPARISON:  12/17/2013  FINDINGS: Patient is rotated to the right. Lungs are adequately inflated without focal consolidation or effusion. Cardiomediastinal silhouette and remainder the exam is unchanged.  IMPRESSION: No active disease.   Electronically Signed   By: Marin Olp M.D.   On: 01/14/2014 16:34    CBC  Recent Labs Lab 01/14/14 1511 01/14/14 1611  01/15/14 0544  WBC 13.5*  --  9.9  HGB 14.3 16.0* 12.3  HCT 43.0 47.0* 37.0  PLT 747*  --  720*  MCV 82.4  --  81.0  MCH 27.4  --  26.9  MCHC 33.3  --  33.2  RDW 17.2*  --  17.2*  LYMPHSABS 1.4  --   --   MONOABS 1.9*  --   --   EOSABS 0.0  --   --   BASOSABS 0.0  --   --     Chemistries   Recent Labs Lab 01/14/14 1511 01/14/14 1611 01/15/14 0544  NA 146 147 149*  K 3.7 3.4* 3.9  CL 104 109 111  CO2 23  --  22  GLUCOSE 126* 133* 112*  BUN 19 19 16   CREATININE 0.49* 0.60 0.42*  CALCIUM 10.4  --  9.3  AST 22  --   --   ALT 8  --   --  ALKPHOS 75  --   --   BILITOT 1.0  --   --    ------------------------------------------------------------------------------------------------------------------ CrCl is unknown because both a height and weight (above a minimum accepted value) are required for this calculation. ------------------------------------------------------------------------------------------------------------------ No results found for this basename: HGBA1C,  in the last 72 hours ------------------------------------------------------------------------------------------------------------------  Recent Labs  01/15/14 0544  CHOL 142  HDL 63  LDLCALC 65  TRIG 70  CHOLHDL 2.3   ------------------------------------------------------------------------------------------------------------------ No results found for this basename: TSH, T4TOTAL, FREET3, T3FREE, THYROIDAB,  in the last 72 hours ------------------------------------------------------------------------------------------------------------------ No results found for this basename: VITAMINB12, FOLATE, FERRITIN, TIBC, IRON, RETICCTPCT,  in the last 72 hours  Coagulation profile  Recent Labs Lab 01/14/14 1511  INR 1.08    No results found for this basename: DDIMER,  in the last 72 hours  Cardiac Enzymes No results found for this basename: CK, CKMB, TROPONINI, MYOGLOBIN,  in the last 168  hours ------------------------------------------------------------------------------------------------------------------ No components found with this basename: POCBNP,     Reg Bircher D.O. on 01/15/2014 at 9:34 AM  Between 7am to 7pm - Pager - [phone]  After 7pm go to [email or web address] - password [email or web address]  And look for the night coverage person covering for me after hours  [facility] Office  [phone]

## 2014-01-15 NOTE — Progress Notes (Signed)
0800 - unable to complete NIH stroke scale due to pt not being able to follow commands. Pt confused. Alert and oriented to self only. Will monitor closely  [name] 01/15/2014 1:04 PM

## 2014-01-15 NOTE — Clinical Social Work Note (Signed)
CSW consulted regarding pt admitted from facility [facility]). CSW attempted to speak with pt's nephew, Mena Goes ([phone]), regarding discharge disposition. CSW has left message with pt's nephew.  CSW to continue to follow and assist with discharge planning.  Lubertha Sayres, [place] [phone]) Licensed Clinical Social Worker Neuroscience [phone]) and Medical ICU (20M)

## 2014-01-15 NOTE — Evaluation (Signed)
Occupational Therapy Evaluation Patient Details Name: Cristina Patton MRN: [id number] DOB: 1926-11-25 Today's Date: 01/15/2014    History of Present Illness Cristina Patton is a 80 y.o. female admitted from [place] with elevated BP and lethargic. Pt was total (A) for all adls and two person (A) for transfer to w/c. suspected PNA aspiration.  MRI (+) New left centrum semiovale nonhemorrhagic infarcts. Subacute right centrum semiovale infarcts. Remote left occipital lobe infarct. PMH: CVA ( rt cerebellar area) HTN.    Clinical Impression   Defer patient to SNF. No apparent immediate acute care OT needs. Patient evaluated by Occupational Therapy with no further acute OT needs identified. All education has been completed and the patient has no further questions. See below for any follow-up Occupational Therapy or equipment needs. OT to sign off. Thank you for referral.        Follow Up Recommendations  SNF;Supervision/Assistance - 24 hour    Equipment Recommendations  Wheelchair (measurements OT);Wheelchair cushion (measurements OT);Hospital bed;Other (comment) (lift)    Recommendations for Other Services       Precautions / Restrictions Precautions Precautions: Fall Restrictions Weight Bearing Restrictions: No      Mobility Bed Mobility Overal bed mobility: +2 for physical assistance;+ 2 for safety/equipment;Needs Assistance Bed Mobility: Supine to Sit;Sit to Supine Rolling: +2 for physical assistance;Total assist Sidelying to sit: +2 for physical assistance;Total assist Supine to sit: Total assist;+2 for physical assistance Sit to supine: +2 for physical assistance;Total assist   General bed mobility comments: no (A) total +2  Transfers                 General transfer comment: not attempted due to poor posture    Balance Overall balance assessment: Needs assistance Sitting-balance support: No upper extremity supported;Feet unsupported Sitting  balance-Leahy Scale: Zero                                      ADL Overall ADL's : Needs assistance/impaired     Grooming: Total assistance   Upper Body Bathing: Total assistance   Lower Body Bathing: Total assistance                       Functional mobility during ADLs:  (no appropriate) General ADL Comments: Pt requires total (A) for bed mobility and max (A) for eob sitting. pt with lateral lean to the Rt. pt with knee flexion, hip flexion, scapula elevation, neck flexion and elbow flexion.       Vision                     Perception     Praxis      Pertinent Vitals/Pain Pain Assessment: Faces Faces Pain Scale: Hurts even more Pain Location: range of bil LE     Hand Dominance Right   Extremity/Trunk Assessment Upper Extremity Assessment Upper Extremity Assessment: Generalized weakness   Lower Extremity Assessment Lower Extremity Assessment: Defer to PT evaluation (appears LT LE is contracted) RLE Deficits / Details: facial grimace with EOB PROM stretch LLE Deficits / Details: facial grimace with PROM unable to fully extend knee   Cervical / Trunk Assessment Cervical / Trunk Assessment: Kyphotic   Communication Communication Communication: Expressive difficulties   Cognition Arousal/Alertness: Awake/alert Behavior During Therapy: Flat affect Overall Cognitive Status: History of cognitive impairments - at baseline Area of Impairment: Orientation;Memory;Attention Orientation Level:  Disoriented to;Person;Place;Time;Situation Current Attention Level: Focused Memory: Decreased short-term memory         General Comments: Pt reports birthday as [birth year]. Pt unaware of currently location and unable to provided reason for admission. pt able to follow simple one step command (show me two fingers, touch your nose and reach for my hand)   General Comments       Exercises       Shoulder Instructions      Home Living  Family/patient expects to be discharged to:: Skilled nursing facility                                 Additional Comments: From [place] [facility]      Prior Functioning/Environment Level of Independence: Needs assistance  Gait / Transfers Assistance Needed: total (A) for transfer  two person (A) pt does not ambulate. W/c bound ADL's / Homemaking Assistance Needed: total (A) for all adls Communication / Swallowing Assistance Needed: requires (A) and cueing for eating to avoid pocketing food per SNF Comments: Pt is poor historian due to decr cognition     OT Diagnosis:     OT Problem List:     OT Treatment/Interventions:      OT Goals(Current goals can be found in the care plan section) Acute Rehab OT Goals Patient Stated Goal: none stated OT Goal Formulation: Patient unable to participate in goal setting Potential to Achieve Goals: Fair  OT Frequency:     Barriers to D/C:            Co-evaluation              End of Session Equipment Utilized During Treatment: Gait belt Nurse Communication: Mobility status;Precautions  Activity Tolerance: Patient tolerated treatment well Patient left: in bed;with call bell/phone within reach;with bed alarm set   Time: 7616-0737 OT Time Calculation (min): 18 min Charges:  OT General Charges $OT Visit: 1 Procedure OT Evaluation $Initial OT Evaluation Tier I: 1 Procedure OT Treatments $Self Care/Home Management : 8-22 mins G-Codes:    Parke Poisson B 01/17/14, 11:26 AM Pager: [phone]

## 2014-01-15 NOTE — Consult Note (Signed)
NEURO HOSPITALIST CONSULT NOTE    Reason for Consult: Altered mental status, stroke  HPI:                                                                                                                                          Cristina Patton is an 80 y.o. female with a past medical history that is relevant for HTN, recent admission to [facility] with acute infarcts in the right ACA / MCA watershed territory and aspiration PNA in August 2015, residual left hemiparesis since November 2014, brought in form a skilled nursing facility for further evaluation and management of altered mental status. Work up revealed new left centrum semiovale nonhemorrhagic infarcts and subacute right centrum semiovale infarcts. MRA  brain showed diffuse ntracranial atherosclerotic disease most notable involving medium size vessels and branch vessels but also involving the basilar artery and supraclinoid segment left internal internal carotid artery.  Patient is alert and awake but confused and thus all clinical information was obtained form the medical record.  Per record review, " pt noted to be progressively more lethargic since earlier in the day, no reported fevers, chills, no noted dyspnea, no abd concerns reported. In ED, pt noted to be lethargic, unable to follow any commands and unable to answer questions". Serologies today significant for mild hypernatremia. At this moment she is awake and follows simple commands inconsistently. Denies HA, vertigo, double vision, difficulty swallowing, slurred speech, language or vision impairment.  Past Medical History  Diagnosis Date  . Hypertension   . CVA (cerebral infarction)   . Stroke     History reviewed. No pertinent past surgical history.  No family history on file.   Social History:  reports that she has never smoked. She does not have any smokeless tobacco history on file. She reports that she does not drink alcohol or use illicit  drugs.  No Known Allergies  MEDICATIONS:                                                                                                                     Scheduled: .  stroke: mapping our early stages of recovery book   Does not apply Once  . amLODipine  10 mg Oral q morning - 10a  . aspirin  300  mg Rectal Daily  . enoxaparin (LOVENOX) injection  40 mg Subcutaneous QHS  . sodium chloride  3 mL Intravenous Q12H     ROS: unable to obtain due to mental status                                                                                                                            History obtained from chart review   Physical exam: pleasant female in no apparent distress. Blood pressure 172/72, pulse 102, temperature 97.7 F (36.5 C), temperature source Axillary, resp. rate 18, SpO2 100.00%. Head: normocephalic. Neck: supple, no bruits, no JVD. Cardiac: no murmurs. Lungs: clear. Abdomen: soft, no tender, no mass. Extremities: no edema. Neurologic Examination:                                                                                                      Limited exam due to patient's confusion. Mental status; alert and awake, oriented to place but doesn't know year-month-day or why she is in the hospital. Comprehension, naming, and repetition seem to be appropriate. Follows simple commands inconsistently. CN 2-12: pupils 2 mm bilaterally, reactive. Can not appreciate visual field deficits. EOM full without nystagmus. Face with mild left facial droop. Tongue central. Motor: patient is in a fetal position and complains of pain when she tries to move, therefore motor testing in the lower extremities is unreliable. She appears to have mild weakness left greater than right UE. Sensory: no tested. Coordination and gait: patient doesn't cooperate for such testing.    Lab Results  Component Value Date/Time   CHOL 142 01/15/2014  5:44 AM    Results for orders placed during the  hospital encounter of 01/14/14 (from the past 48 hour(s))  CBC WITH DIFFERENTIAL     Status: Abnormal   Collection Time    01/14/14  3:11 PM      Result Value Ref Range   WBC 13.5 (*) 4.0 - 10.5 K/uL   RBC 5.22 (*) 3.87 - 5.11 MIL/uL   Hemoglobin 14.3  12.0 - 15.0 g/dL   HCT 43.0  36.0 - 46.0 %   MCV 82.4  78.0 - 100.0 fL   MCH 27.4  26.0 - 34.0 pg   MCHC 33.3  30.0 - 36.0 g/dL   RDW 17.2 (*) 11.5 - 15.5 %   Platelets 747 (*) 150 - 400 K/uL   Neutrophils Relative % 75  43 - 77 %   Neutro Abs 10.2 (*) 1.7 - 7.7 K/uL   Lymphocytes Relative 11 (*) 12 -  46 %   Lymphs Abs 1.4  0.7 - 4.0 K/uL   Monocytes Relative 14 (*) 3 - 12 %   Monocytes Absolute 1.9 (*) 0.1 - 1.0 K/uL   Eosinophils Relative 0  0 - 5 %   Eosinophils Absolute 0.0  0.0 - 0.7 K/uL   Basophils Relative 0  0 - 1 %   Basophils Absolute 0.0  0.0 - 0.1 K/uL  COMPREHENSIVE METABOLIC PANEL     Status: Abnormal   Collection Time    01/14/14  3:11 PM      Result Value Ref Range   Sodium 146  137 - 147 mEq/L   Potassium 3.7  3.7 - 5.3 mEq/L   Chloride 104  96 - 112 mEq/L   CO2 23  19 - 32 mEq/L   Glucose, Bld 126 (*) 70 - 99 mg/dL   BUN 19  6 - 23 mg/dL   Creatinine, Ser 0.49 (*) 0.50 - 1.10 mg/dL   Calcium 10.4  8.4 - 10.5 mg/dL   Total Protein 8.5 (*) 6.0 - 8.3 g/dL   Albumin 3.5  3.5 - 5.2 g/dL   AST 22  0 - 37 U/L   ALT 8  0 - 35 U/L   Alkaline Phosphatase 75  39 - 117 U/L   Total Bilirubin 1.0  0.3 - 1.2 mg/dL   GFR calc non Af Amer 86 (*) >90 mL/min   GFR calc Af Amer >90  >90 mL/min   Comment: (NOTE)     The eGFR has been calculated using the CKD EPI equation.     This calculation has not been validated in all clinical situations.     eGFR's persistently <90 mL/min signify possible Chronic Kidney     Disease.   Anion gap 19 (*) 5 - 15  PROTIME-INR     Status: None   Collection Time    01/14/14  3:11 PM      Result Value Ref Range   Prothrombin Time 14.0  11.6 - 15.2 seconds   INR 1.08  0.00 - 1.49  APTT      Status: None   Collection Time    01/14/14  3:11 PM      Result Value Ref Range   aPTT 35  24 - 37 seconds  I-STAT TROPOININ, ED     Status: None   Collection Time    01/14/14  4:09 PM      Result Value Ref Range   Troponin i, poc 0.01  0.00 - 0.08 ng/mL   Comment 3            Comment: Due to the release kinetics of cTnI,     a negative result within the first hours     of the onset of symptoms does not rule out     myocardial infarction with certainty.     If myocardial infarction is still suspected,     repeat the test at appropriate intervals.  I-STAT CHEM 8, ED     Status: Abnormal   Collection Time    01/14/14  4:11 PM      Result Value Ref Range   Sodium 147  137 - 147 mEq/L   Potassium 3.4 (*) 3.7 - 5.3 mEq/L   Chloride 109  96 - 112 mEq/L   BUN 19  6 - 23 mg/dL   Creatinine, Ser 0.60  0.50 - 1.10 mg/dL   Glucose, Bld 133 (*) 70 - 99 mg/dL  Calcium, Ion 1.25  1.13 - 1.30 mmol/L   TCO2 29  0 - 100 mmol/L   Hemoglobin 16.0 (*) 12.0 - 15.0 g/dL   HCT 47.0 (*) 36.0 - 46.0 %  I-STAT CG4 LACTIC ACID, ED     Status: None   Collection Time    01/14/14  4:11 PM      Result Value Ref Range   Lactic Acid, Venous 1.95  0.5 - 2.2 mmol/L  CBG MONITORING, ED     Status: Abnormal   Collection Time    01/14/14  4:48 PM      Result Value Ref Range   Glucose-Capillary 125 (*) 70 - 99 mg/dL  URINALYSIS, ROUTINE W REFLEX MICROSCOPIC     Status: Abnormal   Collection Time    01/14/14  5:05 PM      Result Value Ref Range   Color, Urine AMBER (*) YELLOW   Comment: BIOCHEMICALS MAY BE AFFECTED BY COLOR   APPearance CLEAR  CLEAR   Specific Gravity, Urine 1.023  1.005 - 1.030   pH 6.0  5.0 - 8.0   Glucose, UA NEGATIVE  NEGATIVE mg/dL   Hgb urine dipstick NEGATIVE  NEGATIVE   Bilirubin Urine MODERATE (*) NEGATIVE   Ketones, ur >80 (*) NEGATIVE mg/dL   Protein, ur 100 (*) NEGATIVE mg/dL   Urobilinogen, UA 0.2  0.0 - 1.0 mg/dL   Nitrite NEGATIVE  NEGATIVE   Leukocytes, UA  NEGATIVE  NEGATIVE  URINE MICROSCOPIC-ADD ON     Status: Abnormal   Collection Time    01/14/14  5:05 PM      Result Value Ref Range   Squamous Epithelial / LPF RARE  RARE   WBC, UA 0-2  <3 WBC/hpf   RBC / HPF 0-2  <3 RBC/hpf   Bacteria, UA FEW (*) RARE  CBC     Status: Abnormal   Collection Time    01/15/14  5:44 AM      Result Value Ref Range   WBC 9.9  4.0 - 10.5 K/uL   RBC 4.57  3.87 - 5.11 MIL/uL   Hemoglobin 12.3  12.0 - 15.0 g/dL   Comment: DELTA CHECK NOTED   HCT 37.0  36.0 - 46.0 %   MCV 81.0  78.0 - 100.0 fL   MCH 26.9  26.0 - 34.0 pg   MCHC 33.2  30.0 - 36.0 g/dL   RDW 17.2 (*) 11.5 - 15.5 %   Platelets 720 (*) 150 - 400 K/uL  BASIC METABOLIC PANEL     Status: Abnormal   Collection Time    01/15/14  5:44 AM      Result Value Ref Range   Sodium 149 (*) 137 - 147 mEq/L   Potassium 3.9  3.7 - 5.3 mEq/L   Chloride 111  96 - 112 mEq/L   CO2 22  19 - 32 mEq/L   Glucose, Bld 112 (*) 70 - 99 mg/dL   BUN 16  6 - 23 mg/dL   Creatinine, Ser 0.42 (*) 0.50 - 1.10 mg/dL   Calcium 9.3  8.4 - 10.5 mg/dL   GFR calc non Af Amer >90  >90 mL/min   GFR calc Af Amer >90  >90 mL/min   Comment: (NOTE)     The eGFR has been calculated using the CKD EPI equation.     This calculation has not been validated in all clinical situations.     eGFR's persistently <90 mL/min signify possible Chronic Kidney  Disease.   Anion gap 16 (*) 5 - 15  LIPID PANEL     Status: None   Collection Time    01/15/14  5:44 AM      Result Value Ref Range   Cholesterol 142  0 - 200 mg/dL   Triglycerides 70  <150 mg/dL   HDL 63  >39 mg/dL   Total CHOL/HDL Ratio 2.3     VLDL 14  0 - 40 mg/dL   LDL Cholesterol 65  0 - 99 mg/dL   Comment:            Total Cholesterol/HDL:CHD Risk     Coronary Heart Disease Risk Table                         Men   Women      1/2 Average Risk   3.4   3.3      Average Risk       5.0   4.4      2 X Average Risk   9.6   7.1      3 X Average Risk  23.4   11.0                 Use the calculated Patient Ratio     above and the CHD Risk Table     to determine the patient's CHD Risk.                ATP III CLASSIFICATION (LDL):      <100     mg/dL   Optimal      100-129  mg/dL   Near or Above                        Optimal      130-159  mg/dL   Borderline      160-189  mg/dL   High      >190     mg/dL   Very High    Ct Head Wo Contrast  01/14/2014   CLINICAL DATA:  Altered mental status. Decreased level of consciousness. Recent infarcts.  EXAM: CT HEAD WITHOUT CONTRAST  TECHNIQUE: Contiguous axial images were obtained from the base of the skull through the vertex without intravenous contrast.  COMPARISON:  MRI brain 12/17/2013  FINDINGS: Asymmetric subcortical white matter hypoattenuation in the high left frontal lobe on images 20 and 21 of series 201 is concerning for an acute nonhemorrhagic infarct. Mild generalized atrophy and white matter disease is otherwise stable. There is no hemorrhage or mass. Remote infarcts of the basal ganglia are noted bilaterally.  The ventricles are proportionate to the degree of atrophy. No significant extra-axial fluid collection is present. Atherosclerotic changes are again noted within the cavernous carotid arteries.  IMPRESSION: 1. Asymmetric focal subcortical area of white matter hypoattenuation in the high left frontal lobe is concerning for an acute nonhemorrhagic infarct. MRI could be used for confirmation. 2. Otherwise stable atrophy and white matter disease. 3. Atherosclerosis.   Electronically Signed   By: Lawrence Santiago M.D.   On: 01/14/2014 16:15   [name] Wo Contrast  01/14/2014   CLINICAL DATA:  80 year old female with decreased level of consciousness. High blood pressure.  EXAM: MRI HEAD WITHOUT CONTRAST  MRA HEAD WITHOUT CONTRAST  TECHNIQUE: Multiplanar, multiecho pulse sequences of the brain and surrounding structures were obtained without intravenous contrast. Angiographic images of the head were obtained  using  MRA technique without contrast.  COMPARISON:  01/14/2014 CT.  12/17/2013 MR.  FINDINGS: MRI HEAD FINDINGS  New left centrum semiovale nonhemorrhagic infarcts.  Subacute right centrum semiovale infarcts.  Remote left occipital lobe infarct.  Prominent small vessel disease type changes.  No intracranial hemorrhage.  No intracranial mass lesion noted on this unenhanced exam.  Global atrophy without hydrocephalus.  Partially empty non expanded sella incidentally noted.  Partial opacification right mastoid air cells without obstructing mass seen causing eustachian tube dysfunction.  MRA HEAD FINDINGS  Fusiform ectasia pre cavernous segment right internal carotid artery. Mild narrowing and irregularity  Ectatic left internal carotid artery distal vertical cervical segment.  Mild ectasia and slight irregularity with minimal narrowing involving portions of the cavernous segment of the internal carotid artery bilaterally.  Mild narrowing supraclinoid segment left internal carotid artery.  Moderate to marked tandem stenosis A2 segment anterior cerebral artery greater on the right.  Moderate narrowing proximal M2 segment and marked narrowing M3 segment right middle cerebral artery branches. Mild to moderate narrowing left middle cerebral artery branches.  Left vertebral artery ends in a posterior inferior cerebellar artery distribution.  Ectatic right vertebral artery without significant narrowing.  Nonvisualized right posterior inferior cerebellar artery.  Mild to moderate tandem stenosis basilar artery.  Nonvisualized left anterior inferior cerebellar artery.  Moderate to marked narrowing proximal left superior cerebellar artery.  Moderate to marked tandem stenosis portions of the posterior cerebral artery bilaterally.  No aneurysm visualized.  IMPRESSION: MRI HEAD:  New left centrum semiovale nonhemorrhagic infarcts.  Subacute right centrum semiovale infarcts.  Remote left occipital lobe infarct.  Prominent small vessel  disease type changes.  No intracranial hemorrhage.  Partial opacification right mastoid air cells without obstructing mass seen causing eustachian tube dysfunction.  MRA HEAD FINDINGS  Intracranial atherosclerotic type changes most notable involving medium size vessels and branch vessels but also involving the basilar artery and supraclinoid segment left internal internal carotid artery as detailed above.   Electronically Signed   By: Chauncey Cruel M.D.   On: 01/14/2014 20:57   Mr Brain Wo Contrast  01/14/2014   CLINICAL DATA:  80 year old female with decreased level of consciousness. High blood pressure.  EXAM: MRI HEAD WITHOUT CONTRAST  MRA HEAD WITHOUT CONTRAST  TECHNIQUE: Multiplanar, multiecho pulse sequences of the brain and surrounding structures were obtained without intravenous contrast. Angiographic images of the head were obtained using MRA technique without contrast.  COMPARISON:  01/14/2014 CT.  12/17/2013 MR.  FINDINGS: MRI HEAD FINDINGS  New left centrum semiovale nonhemorrhagic infarcts.  Subacute right centrum semiovale infarcts.  Remote left occipital lobe infarct.  Prominent small vessel disease type changes.  No intracranial hemorrhage.  No intracranial mass lesion noted on this unenhanced exam.  Global atrophy without hydrocephalus.  Partially empty non expanded sella incidentally noted.  Partial opacification right mastoid air cells without obstructing mass seen causing eustachian tube dysfunction.  MRA HEAD FINDINGS  Fusiform ectasia pre cavernous segment right internal carotid artery. Mild narrowing and irregularity  Ectatic left internal carotid artery distal vertical cervical segment.  Mild ectasia and slight irregularity with minimal narrowing involving portions of the cavernous segment of the internal carotid artery bilaterally.  Mild narrowing supraclinoid segment left internal carotid artery.  Moderate to marked tandem stenosis A2 segment anterior cerebral artery greater on the right.   Moderate narrowing proximal M2 segment and marked narrowing M3 segment right middle cerebral artery branches. Mild to moderate narrowing left middle cerebral artery branches.  Left  vertebral artery ends in a posterior inferior cerebellar artery distribution.  Ectatic right vertebral artery without significant narrowing.  Nonvisualized right posterior inferior cerebellar artery.  Mild to moderate tandem stenosis basilar artery.  Nonvisualized left anterior inferior cerebellar artery.  Moderate to marked narrowing proximal left superior cerebellar artery.  Moderate to marked tandem stenosis portions of the posterior cerebral artery bilaterally.  No aneurysm visualized.  IMPRESSION: MRI HEAD:  New left centrum semiovale nonhemorrhagic infarcts.  Subacute right centrum semiovale infarcts.  Remote left occipital lobe infarct.  Prominent small vessel disease type changes.  No intracranial hemorrhage.  Partial opacification right mastoid air cells without obstructing mass seen causing eustachian tube dysfunction.  MRA HEAD FINDINGS  Intracranial atherosclerotic type changes most notable involving medium size vessels and branch vessels but also involving the basilar artery and supraclinoid segment left internal internal carotid artery as detailed above.   Electronically Signed   By: Chauncey Cruel M.D.   On: 01/14/2014 20:57   Dg Chest Port 1 View  01/14/2014   CLINICAL DATA:  Altered mental status.  EXAM: PORTABLE CHEST - 1 VIEW  COMPARISON:  12/17/2013  FINDINGS: Patient is rotated to the right. Lungs are adequately inflated without focal consolidation or effusion. Cardiomediastinal silhouette and remainder the exam is unchanged.  IMPRESSION: No active disease.   Electronically Signed   By: Marin Olp M.D.   On: 01/14/2014 16:34   Assessment/Plan: 80 y/o recently admitted to this hospital due to right ACA / MCA watershed territory, brought in with altered mental status. MRI brain reveals new left centrum semiovale  nonhemorrhagic infarcts and subacute right centrum semiovale infarcts. MRA  brain showed diffuse intracranial atherosclerotic disease most notable involving medium size vessels and branch vessels but also involving the basilar artery and supraclinoid segment left internal internal carotid artery, hence stroke mechanism could be secondary to symptomatic intracranial atherosclerosis.  On aspirin. She has diffuse symptomatic intracranial atherosclerotic disease and may consider dual antiplatelet therapy with the drawback that it may increase risk of bleeding. Stroke team will follow up tomorrow. Of note, patient had recent extensive stroke work up and will not repeat at this moment.   Dorian Pod, MD 01/15/2014, 8:38 AM [facility].

## 2014-01-15 NOTE — Evaluation (Signed)
Physical Therapy Evaluation Patient Details Name: Cristina Patton MRN: [id number] DOB: 03-04-1927 Today's Date: 01/15/2014   History of Present Illness  Cristina Patton is a 80 y.o. female admitted from [facility] with elevated BP and lethargic. Pt was total (A) for all adls and two person (A) for transfer to w/c. suspected PNA aspiration.  MRI (+) New left centrum semiovale nonhemorrhagic infarcts. Subacute right centrum semiovale infarcts. Remote left occipital lobe infarct. PMH: CVA ( rt cerebellar area) HTN.   Clinical Impression  Pt with severely limited mobility at baseline, but when given extra time, can participate some in her own mobility and seated exercises.  Will take two people to safely attempt standing and transfers.  Pt responded well to LE stretching in side lying (hip flexion tightness is the worst of everything) and did well with stretching first and then mobility.  PT will follow acutely and pt is appropriate for return to SNF at discharge.     Follow Up Recommendations SNF    Equipment Recommendations  None recommended by PT    Recommendations for Other Services   NA    Precautions / Restrictions Precautions Precautions: Fall Precaution Comments: risk for subluxation of LT shoulder, weak all over, but L>R      Mobility  Bed Mobility     Rolling: Total assist   Supine to sit: Total assist Sit to supine: Total assist   General bed mobility comments: Total assist to support both her arms and trunk during transitional movements.  Pt does have use of her right upper extremity (limited-see OT note) and has potential if given enough time and multimodal cues to be able to assist in her own bed mobility  Transfers Overall transfer level: Needs assistance Equipment used: None Transfers: Sit to/from Stand (sit to squat)           General transfer comment: Squat stand x 2 to get pt closer to the [facility] with total assist and bil legs blocked.         Modified Rankin (Stroke Patients Only) Modified Rankin (Stroke Patients Only) Pre-Morbid Rankin Score: Severe disability Modified Rankin: Severe disability     Balance Overall balance assessment: Needs assistance Sitting-balance support: Feet supported;Single extremity supported Sitting balance-Leahy Scale: Poor Sitting balance - Comments: Pt needed mod assist once situated (sacrum sitting with bil legs positoned squrely on the floor and right arm positioned on the rail with manual assist.  Pt uses trunk extension to look up due to severe kyphosis.     Standing balance support: No upper extremity supported Standing balance-Leahy Scale: Zero Standing balance comment: total assist at attempts to squat                             Pertinent Vitals/Pain Pain Assessment: No/denies pain (at rest)    Home Living Family/patient expects to be discharged to:: Skilled nursing facility                 Additional Comments: From [employer]    Prior Function Level of Independence: Needs assistance   Gait / Transfers Assistance Needed: total (A) for transfer  two person (A) pt does not ambulate. W/c bound  ADL's / Homemaking Assistance Needed: total (A) for all adls  Comments: Pt is poor historian due to decr cognition      Hand Dominance   Dominant Hand: Right    Extremity/Trunk Assessment   Upper Extremity Assessment:  Defer to OT evaluation           Lower Extremity Assessment: RLE deficits/detail;LLE deficits/detail RLE Deficits / Details: bil legs with flexion tightness unable to get to full extension bil by at least 20 degrees.  She does tolerate some gentle stretching until facial grimice is noted.  Pt also lacking bil knee extension (left greater than right due to hemiperesis) byu ~15 degrees.  Pt, again, tolerates a passive stretch well (end feels are firm, not hard).  Pt was able to do a partial LAQ in seated EOB and initiate hip flexion quad 3-/5 in  available ROM and hip flexion 2+/5.  LLE Deficits / Details: bil legs with flexion tightness unable to get to full extension bil by at least 20 degrees.  She does tolerate some gentle stretching until facial grimice is noted.  Pt also lacking bil knee extension (left greater than right due to hemiperesis) byu ~15 degrees.  Pt, again, tolerates a passive stretch well (end feels are firm, not hard).  As far as I can tell her left leg is flaccid with no signs of active movement.  It will move when you move her other leg, but more due to muscle tightness and tone than volitional movement.    Cervical / Trunk Assessment: Kyphotic  Communication   Communication: Expressive difficulties;Other (comment) (also had difficulty keeping saliva in mouth)  Cognition Arousal/Alertness: Awake/alert Behavior During Therapy: Flat affect Overall Cognitive Status: History of cognitive impairments - at baseline Area of Impairment: Problem solving;Following commands       Following Commands: Follows one step commands inconsistently;Follows one step commands with increased time     Problem Solving: Decreased initiation;Difficulty sequencing;Requires verbal cues;Requires tactile cues;Slow processing General Comments: Pt is slow to respond to questions if at all.  She is also slow to process commands, but did well in sitting with LE exercises given increased time.     General Comments General comments (skin integrity, edema, etc.): High risk for skin breakdown. Pt checked and she was dry.  Rolled her to the opposite side of where she was when I entered due to pt starting to get pink on the side she was positioned on when therapist entered the room.     Exercises General Exercises - Lower Extremity Long Arc Quad: AROM;PROM;Right;Left;Both;10 reps;Seated Hip Flexion/Marching: AROM;PROM;Right;Left;Both;10 reps;Seated      Assessment/Plan    PT Assessment Patient needs continued PT services  PT Diagnosis  Difficulty walking;Abnormality of gait;Generalized weakness   PT Problem List Decreased strength;Decreased activity tolerance;Decreased balance;Decreased mobility;Decreased cognition;Decreased knowledge of use of DME;Impaired tone  PT Treatment Interventions Functional mobility training;Therapeutic activities;Therapeutic exercise;Balance training;Neuromuscular re-education;Patient/family education   PT Goals (Current goals can be found in the Care Plan section) Acute Rehab PT Goals Patient Stated Goal: unable to state PT Goal Formulation: Patient unable to participate in goal setting Time For Goal Achievement: 01/29/14 Potential to Achieve Goals: Fair    Frequency Min 2X/week    End of Session   Activity Tolerance: Patient tolerated treatment well Patient left: in bed;with call bell/phone within reach;with bed alarm set           Time: 3976-7341 PT Time Calculation (min): 18 min   Charges:   PT Evaluation $Initial PT Evaluation Tier I: 1 Procedure PT Treatments $Therapeutic Activity: 8-22 mins        Elonda Giuliano B. Corson, [place], DPT [phone]   01/15/2014, 4:01 PM

## 2014-01-16 DIAGNOSIS — I6350 Cerebral infarction due to unspecified occlusion or stenosis of unspecified cerebral artery: Principal | ICD-10-CM

## 2014-01-16 DIAGNOSIS — I10 Essential (primary) hypertension: Secondary | ICD-10-CM

## 2014-01-16 LAB — CBC
HEMATOCRIT: 35.5 % — AB (ref 36.0–46.0)
Hemoglobin: 11.7 g/dL — ABNORMAL LOW (ref 12.0–15.0)
MCH: 26.7 pg (ref 26.0–34.0)
MCHC: 33 g/dL (ref 30.0–36.0)
MCV: 80.9 fL (ref 78.0–100.0)
Platelets: 776 10*3/uL — ABNORMAL HIGH (ref 150–400)
RBC: 4.39 MIL/uL (ref 3.87–5.11)
RDW: 17.2 % — AB (ref 11.5–15.5)
WBC: 8.2 10*3/uL (ref 4.0–10.5)

## 2014-01-16 LAB — BASIC METABOLIC PANEL
ANION GAP: 16 — AB (ref 5–15)
BUN: 14 mg/dL (ref 6–23)
CHLORIDE: 110 meq/L (ref 96–112)
CO2: 24 mEq/L (ref 19–32)
Calcium: 9.4 mg/dL (ref 8.4–10.5)
Creatinine, Ser: 0.45 mg/dL — ABNORMAL LOW (ref 0.50–1.10)
GFR calc non Af Amer: 89 mL/min — ABNORMAL LOW (ref >90)
Glucose, Bld: 94 mg/dL (ref 70–99)
Potassium: 3 mEq/L — ABNORMAL LOW (ref 3.7–5.3)
Sodium: 150 mEq/L — ABNORMAL HIGH (ref 137–147)

## 2014-01-16 MED ORDER — METOPROLOL TARTRATE 50 MG PO TABS
50.0000 mg | ORAL_TABLET | Freq: Two times a day (BID) | ORAL | Status: DC
  Administered 2014-01-17 – 2014-01-19 (×4): 50 mg via ORAL
  Filled 2014-01-16 (×4): qty 1

## 2014-01-16 MED ORDER — METOPROLOL TARTRATE 25 MG PO TABS
25.0000 mg | ORAL_TABLET | Freq: Two times a day (BID) | ORAL | Status: DC
  Administered 2014-01-16 (×2): 25 mg via ORAL
  Filled 2014-01-16 (×2): qty 1

## 2014-01-16 MED ORDER — POTASSIUM CHLORIDE CRYS ER 20 MEQ PO TBCR
40.0000 meq | EXTENDED_RELEASE_TABLET | Freq: Two times a day (BID) | ORAL | Status: AC
  Administered 2014-01-16 (×2): 40 meq via ORAL
  Filled 2014-01-16 (×2): qty 2

## 2014-01-16 MED ORDER — CLOPIDOGREL BISULFATE 75 MG PO TABS
75.0000 mg | ORAL_TABLET | Freq: Every day | ORAL | Status: DC
  Administered 2014-01-17: 75 mg via ORAL
  Filled 2014-01-16: qty 1

## 2014-01-16 MED ORDER — ASPIRIN EC 325 MG PO TBEC: 325.0000 mg | DELAYED_RELEASE_TABLET | Freq: Every morning | ORAL | Status: DC

## 2014-01-16 MED ORDER — ASPIRIN EC 81 MG PO TBEC
81.0000 mg | DELAYED_RELEASE_TABLET | Freq: Every day | ORAL | Status: DC
  Administered 2014-01-17 – 2014-01-19 (×3): 81 mg via ORAL
  Filled 2014-01-16 (×3): qty 1

## 2014-01-16 NOTE — Clinical Social Work Psychosocial (Signed)
Clinical Social Work Department BRIEF PSYCHOSOCIAL ASSESSMENT 01/16/2014  Patient:  Cristina Patton, Cristina Patton     Account Number:  [id number]     Admit date:  01/14/2014  Clinical Social Worker:  Delrae Sawyers  Date/Time:  01/16/2014 12:06 PM  Referred by:  Physician  Date Referred:  01/16/2014 Referred for  SNF Placement   Other Referral:   none.   Interview type:  Family Other interview type:   CSW spoke with pt's nephew, Mena Goes, regarding discharge disposition.    PSYCHOSOCIAL DATA Living Status:  FACILITY Admitted from facility:  [facility] Level of care:  [place] Primary support name:  Mena Goes Primary support relationship to patient:  FAMILY Degree of support available:   Strong support system.    CURRENT CONCERNS Current Concerns  Post-Acute Placement   Other Concerns:   none.    SOCIAL WORK ASSESSMENT / PLAN CSW consulted regarding pt admitted to [facility] from facility. CSW spoke with pt's nephew regarding pt returning to SNF at time of discharge. Pt's nephew confirmed pt is from [facility] and pt's family would like for pt to return once medically stable for discharge.    CSW to continue to follow and assist with discharge planning needs.   Assessment/plan status:  Psychosocial Support/Ongoing Assessment of Needs Other assessment/ plan:   none.   Information/referral to community resources:   Pt to return to [facility]    PATIENT'S/FAMILY'S RESPONSE TO PLAN OF CARE: Pt's nephew understanding and agreeable to CSW plan of care. Pt's nephew expressed no further questions or concerns at this time.       Lubertha Sayres, [place] [phone]) Licensed Clinical Social Worker Neuroscience [phone]) and Medical ICU (72M)

## 2014-01-16 NOTE — Progress Notes (Signed)
0835 - unable to complete NIH stroke scale due to pt not being able to follow commands. Pt confused. Alert. Will monitor closely  [name] 01/16/2014 8:55 AM

## 2014-01-16 NOTE — Progress Notes (Signed)
[facility]                                                                              Patient Demographics  Cristina Patton, is a 80 y.o. female, DOB - 1927-02-18, ZOX:[id number]  Admit date - 01/14/2014   Admitting Physician Robbie Lis, MD  Outpatient Primary MD for the patient is PROVIDER NOT Rudolph  LOS - 2   Chief Complaint  Patient presents with  . Altered Mental Status      HPI on 01/14/2014 80 y.o. 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 Please note that patient was obtunded at the time of the arrival to the [facility] and was unable to provide any history, most of the details were obtained from ED doctor and available records from SNF. Per record review, patient noted to be progressively more lethargic since earlier in the day, no reported fevers, chills, no noted dyspnea, no abd concerns reported.   In ED, patient noted to be lethargic, unable to follow any commands and unable to answer questions. CT head notable for asymmetric focal subcortical area of white matter hypoattenuation in the high left frontal lobe, concerning for an acute nonhemorrhagic infarct. MRI requested and currently pending. VSS with low grade fever 99.3 F. Blood work notable for mild leukocytosis 13.5, K 3.4. Neurology consulted by ED doctor and [place] asked to admit for further evaluation. Telemetry bed requested.    Assessment & Plan   Acute Encephalopathy secondary to Acute CVA -CT head :Asymmetric focal subcortical area of white matter hypoattenuation   in the high left frontal lobe is concerning for an acute  nonhemorrhagic infarct -MRI head: New left centrum semiovale nonhemorrhagic infarcts -Neurology consulted and appreciated, pending further recommendations -LDL 65, hemoglobin A1c 5.7. -Echocardiogram 12/19/2013: EF 55-60% -Carotid  doppler 12/20/2013: 1-39% ICA stenosis bilaterally. Antegrade vertebral flow. -Currently on Aspirin. Will follow neuro recommendation regarding resuming Plavix.  -As an outpatient was Aspirin 325mg  and plavix 75mg  daily -need SNF.  -Started on Dysphagia 1 diet 9-15.  Hypernatremia: will encourage to drink water, discussed with nurse staff. Will discussed with neurology if we can start IV fluids, D 5.   Sinus Tachycardia: will resume home dose metoprolol.   Hypokalemia: will replete with 40 meq times 2.   Severe protein calorie malnutrition -From underlying CVA and dementia -Last speech recommendation from prior hospitalization recommended dysphagia 1 diet -continue aspiration precautions -Dysphagia 1 diet.   Hypertension -Continue Norvasc and metoprolol once patient passes swallow eval -hydralazine when necessary  Leukocytosis with low-grade fever -Resolved -UA and chest x-ray show no infectious etiology -Continue to monitor  Functional quadriplegia -Complicated by failure to thrive and deconditioning -PT and OT consulted   Thrombocytosis, chronic. On aspirin.   Code Status: Full  Family Communication: None at bedside   Disposition Plan: SNF when hypernatremia improved.   Time Spent in minutes   30 minutes  Procedures  None  Consults   Neurology  DVT Prophylaxis  Lovenox  Lab Results  Component Value Date   PLT 776* 01/16/2014  Medications  Scheduled Meds: .  stroke: mapping our early stages of recovery book   Does not apply Once  . amLODipine  10 mg Oral q morning - 10a  . aspirin  300 mg Rectal Daily  . enoxaparin (LOVENOX) injection  40 mg Subcutaneous QHS  . potassium chloride  40 mEq Oral BID  . sodium chloride  3 mL Intravenous Q12H   Continuous Infusions:   PRN Meds:.sodium chloride, hydrALAZINE, sodium chloride  Antibiotics    Anti-infectives   None      Subjective:   very slow to respond. Denies chest pain or shortness of breath  at this time.  She denies abdominal pain. She relates feeling well.   Objective:   Filed Vitals:   01/15/14 2144 01/16/14 0125 01/16/14 0540 01/16/14 0901  BP: 158/63 156/68 157/61 147/57  Pulse: 108 107 113 115  Temp: 97.8 F (36.6 C) 97.6 F (36.4 C) 97.9 F (36.6 C) 98 F (36.7 C)  TempSrc: Oral Oral Oral Axillary  Resp: 20 20 20 20   SpO2: 98% 98% 98% 97%    Wt Readings from Last 3 Encounters:  12/17/13 52.2 kg (115 lb 1.3 oz)  09/01/13 52.164 kg (115 lb)  04/03/13 56 kg (123 lb 7.3 oz)     Intake/Output Summary (Last 24 hours) at 01/16/14 1035 Last data filed at 01/16/14 0844  Gross per 24 hour  Intake    143 ml  Output      0 ml  Net    143 ml    Exam  General: NAD, appears stated age  HEENT: NCAT, PERRLA, Anicteic Sclera,   Neck: Supple, no JVD, no masses  Cardiovascular: S1 S2 auscultated, no rubs, murmurs or gallops. Regular rate and rhythm.  Respiratory: Clear to auscultation bilaterally with equal chest rise  Abdomen: Soft, nontender, nondistended, + bowel sounds  Extremities: warm dry without cyanosis clubbing or edema  Neuro: AAOx3, not oriented to time.  Cannot follow commands consistently. Has mild left facial droop. Strength testing unable to complete.  Data Review   Micro Results Recent Results (from the past 240 hour(s))  CULTURE, BLOOD (ROUTINE X 2)     Status: None   Collection Time    01/14/14  3:36 PM      Result Value Ref Range Status   Specimen Description BLOOD ARM RIGHT   Final   Special Requests BOTTLES DRAWN AEROBIC AND ANAEROBIC 5CC   Final   Culture  Setup Time     Final   Value: 01/14/2014 20:22     Performed at [facility]   Culture     Final   Value:        BLOOD CULTURE RECEIVED NO GROWTH TO DATE CULTURE WILL BE HELD FOR 5 DAYS BEFORE ISSUING A FINAL NEGATIVE REPORT     Performed at [facility]   Report Status PENDING   Incomplete  CULTURE, BLOOD (ROUTINE X 2)     Status: None   Collection Time     01/14/14  3:49 PM      Result Value Ref Range Status   Specimen Description BLOOD LEFT FOREARM   Final   Special Requests BOTTLES DRAWN AEROBIC AND ANAEROBIC 5CC   Final   Culture  Setup Time     Final   Value: 01/14/2014 20:22     Performed at [facility]   Culture     Final   Value:        BLOOD  CULTURE RECEIVED NO GROWTH TO DATE CULTURE WILL BE HELD FOR 5 DAYS BEFORE ISSUING A FINAL NEGATIVE REPORT     Performed at [facility]   Report Status PENDING   Incomplete    Radiology Reports Dg Chest 2 View  12/17/2013   CLINICAL DATA:  Altered mental status, history stroke, hypertension  EXAM: CHEST  2 VIEW  COMPARISON:  04/02/2013  FINDINGS: Upper normal heart size.  Normal mediastinal contours and pulmonary vascularity.  Rotation to the LEFT.  Mild bibasilar atelectasis.  LEFT upper lobe infiltrate present.  Remaining lungs clear.  No pleural effusion or pneumothorax.  Bones demineralized.  Atherosclerotic calcification aortic arch.  IMPRESSION: LEFT upper lobe infiltrate and mild bibasilar atelectasis.   Electronically Signed   By: Lavonia Dana M.D.   On: 12/17/2013 11:20   Ct Head Wo Contrast  01/14/2014   CLINICAL DATA:  Altered mental status. Decreased level of consciousness. Recent infarcts.  EXAM: CT HEAD WITHOUT CONTRAST  TECHNIQUE: Contiguous axial images were obtained from the base of the skull through the vertex without intravenous contrast.  COMPARISON:  MRI brain 12/17/2013  FINDINGS: Asymmetric subcortical white matter hypoattenuation in the high left frontal lobe on images 20 and 21 of series 201 is concerning for an acute nonhemorrhagic infarct. Mild generalized atrophy and white matter disease is otherwise stable. There is no hemorrhage or mass. Remote infarcts of the basal ganglia are noted bilaterally.  The ventricles are proportionate to the degree of atrophy. No significant extra-axial fluid collection is present. Atherosclerotic changes are again noted within  the cavernous carotid arteries.  IMPRESSION: 1. Asymmetric focal subcortical area of white matter hypoattenuation in the high left frontal lobe is concerning for an acute nonhemorrhagic infarct. MRI could be used for confirmation. 2. Otherwise stable atrophy and white matter disease. 3. Atherosclerosis.   Electronically Signed   By: Lawrence Santiago M.D.   On: 01/14/2014 16:15   Ct Head Wo Contrast  12/17/2013   CLINICAL DATA:  Subjective decrease in overall activity, with increasing LEFT-sided weakness. History of prior stroke. History of hypertension.  EXAM: CT HEAD WITHOUT CONTRAST  TECHNIQUE: Contiguous axial images were obtained from the base of the skull through the vertex without contrast.  COMPARISON:  MR head 03/22/2013.  CT head 03/20/2013.  FINDINGS: No evidence for acute infarction, hemorrhage, mass lesion, hydrocephalus, or extra-axial fluid. Generalized cerebral and cerebellar atrophy. Chronic microvascular ischemic change affects the periventricular and subcortical white matter. There is a remote RIGHT cerebellar infarct which is difficult to visualize. The calvarium is intact. Vascular calcification. There is no acute sinus disease. Trace RIGHT mastoid effusion. When technique differences are considered, similar appearance to priors.  IMPRESSION: No acute findings.  Age-related changes as described.   Electronically Signed   By: Rolla Flatten M.D.   On: 12/17/2013 11:21   Mr Jodene Nam Head Wo Contrast  01/14/2014   CLINICAL DATA:  80 year old female with decreased level of consciousness. High blood pressure.  EXAM: MRI HEAD WITHOUT CONTRAST  MRA HEAD WITHOUT CONTRAST  TECHNIQUE: Multiplanar, multiecho pulse sequences of the brain and surrounding structures were obtained without intravenous contrast. Angiographic images of the head were obtained using MRA technique without contrast.  COMPARISON:  01/14/2014 CT.  12/17/2013 MR.  FINDINGS: MRI HEAD FINDINGS  New left centrum semiovale nonhemorrhagic  infarcts.  Subacute right centrum semiovale infarcts.  Remote left occipital lobe infarct.  Prominent small vessel disease type changes.  No intracranial hemorrhage.  No intracranial mass lesion noted  on this unenhanced exam.  Global atrophy without hydrocephalus.  Partially empty non expanded sella incidentally noted.  Partial opacification right mastoid air cells without obstructing mass seen causing eustachian tube dysfunction.  MRA HEAD FINDINGS  Fusiform ectasia pre cavernous segment right internal carotid artery. Mild narrowing and irregularity  Ectatic left internal carotid artery distal vertical cervical segment.  Mild ectasia and slight irregularity with minimal narrowing involving portions of the cavernous segment of the internal carotid artery bilaterally.  Mild narrowing supraclinoid segment left internal carotid artery.  Moderate to marked tandem stenosis A2 segment anterior cerebral artery greater on the right.  Moderate narrowing proximal M2 segment and marked narrowing M3 segment right middle cerebral artery branches. Mild to moderate narrowing left middle cerebral artery branches.  Left vertebral artery ends in a posterior inferior cerebellar artery distribution.  Ectatic right vertebral artery without significant narrowing.  Nonvisualized right posterior inferior cerebellar artery.  Mild to moderate tandem stenosis basilar artery.  Nonvisualized left anterior inferior cerebellar artery.  Moderate to marked narrowing proximal left superior cerebellar artery.  Moderate to marked tandem stenosis portions of the posterior cerebral artery bilaterally.  No aneurysm visualized.  IMPRESSION: MRI HEAD:  New left centrum semiovale nonhemorrhagic infarcts.  Subacute right centrum semiovale infarcts.  Remote left occipital lobe infarct.  Prominent small vessel disease type changes.  No intracranial hemorrhage.  Partial opacification right mastoid air cells without obstructing mass seen causing eustachian tube  dysfunction.  MRA HEAD FINDINGS  Intracranial atherosclerotic type changes most notable involving medium size vessels and branch vessels but also involving the basilar artery and supraclinoid segment left internal internal carotid artery as detailed above.   Electronically Signed   By: Chauncey Cruel M.D.   On: 01/14/2014 20:57   [name] XL Contrast  12/17/2013   ADDENDUM REPORT: 12/17/2013 15:25  ADDENDUM: Study discussed by telephone with Dr. Carmin Muskrat on 12/17/2013 at 15:20 hours.   Electronically Signed   By: Lars Pinks M.D.   On: 12/17/2013 15:25   12/17/2013   CLINICAL DATA:  80 year old female with increasing weakness and confusion. Initial encounter. History of small right inferior cerebellar infarct in 2014.  EXAM: MRI HEAD WITHOUT CONTRAST  MRA HEAD WITHOUT CONTRAST  TECHNIQUE: Multiplanar, multiecho pulse sequences of the brain and surrounding structures were obtained without intravenous contrast. Angiographic images of the head were obtained using MRA technique without contrast.  COMPARISON:  Head CT without contrast 1050 hr today. Brain MRI and MRA 03/22/2013.  FINDINGS: MRI HEAD FINDINGS  Patchy clustered cortical and white matter restricted diffusion in the right superior frontal gyrus, mostly the pre motor area (see series 5, images 23-28).  No left hemisphere or posterior fossa restricted diffusion identified.  Major intracranial vascular flow voids are stable.  Mild if any associated T2 and FLAIR hyperintensity to correspond with the acute diffusion abnormality. Underlying Patchy and confluent white matter T2 and FLAIR signal changes are stable. No acute intracranial hemorrhage identified. No midline shift, mass effect, or evidence of intracranial mass lesion. No ventriculomegaly. Stable pituitary gland, cervicomedullary junction, and visualized cervical spine. Normal bone marrow signal.  New mild right mastoid effusion. Rightward gaze deviation. Stable paranasal sinuses. Visualized scalp  soft tissues are within normal limits.  MRA HEAD FINDINGS  Stable antegrade flow in the posterior circulation with dominant distal right vertebral artery. Widespread basilar artery irregularity does appear progressed along with mild multifocal basilar artery stenosis. The moderate stenosis just proximal to the SCA origins appears stable.  Widespread bilateral PCA irregularity with high-grade distal P2 stenoses appears stable. Posterior communicating arteries are diminutive or absent.  Stable antegrade flow in the left ICA siphon. No left ICA stenosis identified. Left carotid terminus MCA, and left ACA origins remain within normal limits. Increased irregularity of the visualized left ACA branches, but with Stable distal left ACA flow. Moderate irregularity of the left MCA branches is stable, with stable distal left MCA flow.  Stable antegrade flow in the right ICA siphon but increased irregularity in the vertical P each wrists segment now resulting in mild stenosis. Stable right ICA terminus infundibulum. Patent right ICA terminus with stable right MCA and ACA origins.  New high-grade irregularity and attenuation of flow signal in the low right ACA distal A2 segment, with poor distal right ACA flow signal.  Proximal right M2 branch irregularity and stenosis appears not significantly changed since 2014. Right MCA branch flow signal appears stable, with no interval major right MCA branch occlusion identified.  IMPRESSION: 1. Patchy acute infarcts in the right ACA / MCA watershed territory involving the superior frontal gyrus, mostly in the pre motor area. No mass effect or hemorrhage. 2. New distal right ACA occlusion and high-grade irregularity of the A2 segment, concordant with #1. Right MCA M2 branch atherosclerosis and flow appears stable since 2014. 3. Interval mild progression of right ICA siphon and basilar artery atherosclerosis, but no other new or progressed hemodynamically significant stenosis compared to  the 2014 MRA.  Electronically Signed: By: Lars Pinks M.D. On: 12/17/2013 15:18   Mr Brain Wo Contrast  01/14/2014   CLINICAL DATA:  80 year old female with decreased level of consciousness. High blood pressure.  EXAM: MRI HEAD WITHOUT CONTRAST  MRA HEAD WITHOUT CONTRAST  TECHNIQUE: Multiplanar, multiecho pulse sequences of the brain and surrounding structures were obtained without intravenous contrast. Angiographic images of the head were obtained using MRA technique without contrast.  COMPARISON:  01/14/2014 CT.  12/17/2013 MR.  FINDINGS: MRI HEAD FINDINGS  New left centrum semiovale nonhemorrhagic infarcts.  Subacute right centrum semiovale infarcts.  Remote left occipital lobe infarct.  Prominent small vessel disease type changes.  No intracranial hemorrhage.  No intracranial mass lesion noted on this unenhanced exam.  Global atrophy without hydrocephalus.  Partially empty non expanded sella incidentally noted.  Partial opacification right mastoid air cells without obstructing mass seen causing eustachian tube dysfunction.  MRA HEAD FINDINGS  Fusiform ectasia pre cavernous segment right internal carotid artery. Mild narrowing and irregularity  Ectatic left internal carotid artery distal vertical cervical segment.  Mild ectasia and slight irregularity with minimal narrowing involving portions of the cavernous segment of the internal carotid artery bilaterally.  Mild narrowing supraclinoid segment left internal carotid artery.  Moderate to marked tandem stenosis A2 segment anterior cerebral artery greater on the right.  Moderate narrowing proximal M2 segment and marked narrowing M3 segment right middle cerebral artery branches. Mild to moderate narrowing left middle cerebral artery branches.  Left vertebral artery ends in a posterior inferior cerebellar artery distribution.  Ectatic right vertebral artery without significant narrowing.  Nonvisualized right posterior inferior cerebellar artery.  Mild to moderate  tandem stenosis basilar artery.  Nonvisualized left anterior inferior cerebellar artery.  Moderate to marked narrowing proximal left superior cerebellar artery.  Moderate to marked tandem stenosis portions of the posterior cerebral artery bilaterally.  No aneurysm visualized.  IMPRESSION: MRI HEAD:  New left centrum semiovale nonhemorrhagic infarcts.  Subacute right centrum semiovale infarcts.  Remote left occipital lobe infarct.  Prominent  small vessel disease type changes.  No intracranial hemorrhage.  Partial opacification right mastoid air cells without obstructing mass seen causing eustachian tube dysfunction.  MRA HEAD FINDINGS  Intracranial atherosclerotic type changes most notable involving medium size vessels and branch vessels but also involving the basilar artery and supraclinoid segment left internal internal carotid artery as detailed above.   Electronically Signed   By: Chauncey Cruel M.D.   On: 01/14/2014 20:57   Mr Brain Wo Contrast  12/17/2013   ADDENDUM REPORT: 12/17/2013 15:25  ADDENDUM: Study discussed by telephone with Dr. Carmin Muskrat on 12/17/2013 at 15:20 hours.   Electronically Signed   By: Lars Pinks M.D.   On: 12/17/2013 15:25   12/17/2013   CLINICAL DATA:  80 year old female with increasing weakness and confusion. Initial encounter. History of small right inferior cerebellar infarct in 2014.  EXAM: MRI HEAD WITHOUT CONTRAST  MRA HEAD WITHOUT CONTRAST  TECHNIQUE: Multiplanar, multiecho pulse sequences of the brain and surrounding structures were obtained without intravenous contrast. Angiographic images of the head were obtained using MRA technique without contrast.  COMPARISON:  Head CT without contrast 1050 hr today. Brain MRI and MRA 03/22/2013.  FINDINGS: MRI HEAD FINDINGS  Patchy clustered cortical and white matter restricted diffusion in the right superior frontal gyrus, mostly the pre motor area (see series 5, images 23-28).  No left hemisphere or posterior fossa restricted  diffusion identified.  Major intracranial vascular flow voids are stable.  Mild if any associated T2 and FLAIR hyperintensity to correspond with the acute diffusion abnormality. Underlying Patchy and confluent white matter T2 and FLAIR signal changes are stable. No acute intracranial hemorrhage identified. No midline shift, mass effect, or evidence of intracranial mass lesion. No ventriculomegaly. Stable pituitary gland, cervicomedullary junction, and visualized cervical spine. Normal bone marrow signal.  New mild right mastoid effusion. Rightward gaze deviation. Stable paranasal sinuses. Visualized scalp soft tissues are within normal limits.  MRA HEAD FINDINGS  Stable antegrade flow in the posterior circulation with dominant distal right vertebral artery. Widespread basilar artery irregularity does appear progressed along with mild multifocal basilar artery stenosis. The moderate stenosis just proximal to the SCA origins appears stable.  Widespread bilateral PCA irregularity with high-grade distal P2 stenoses appears stable. Posterior communicating arteries are diminutive or absent.  Stable antegrade flow in the left ICA siphon. No left ICA stenosis identified. Left carotid terminus MCA, and left ACA origins remain within normal limits. Increased irregularity of the visualized left ACA branches, but with Stable distal left ACA flow. Moderate irregularity of the left MCA branches is stable, with stable distal left MCA flow.  Stable antegrade flow in the right ICA siphon but increased irregularity in the vertical P each wrists segment now resulting in mild stenosis. Stable right ICA terminus infundibulum. Patent right ICA terminus with stable right MCA and ACA origins.  New high-grade irregularity and attenuation of flow signal in the low right ACA distal A2 segment, with poor distal right ACA flow signal.  Proximal right M2 branch irregularity and stenosis appears not significantly changed since 2014. Right MCA  branch flow signal appears stable, with no interval major right MCA branch occlusion identified.  IMPRESSION: 1. Patchy acute infarcts in the right ACA / MCA watershed territory involving the superior frontal gyrus, mostly in the pre motor area. No mass effect or hemorrhage. 2. New distal right ACA occlusion and high-grade irregularity of the A2 segment, concordant with #1. Right MCA M2 branch atherosclerosis and flow appears stable since 2014.  3. Interval mild progression of right ICA siphon and basilar artery atherosclerosis, but no other new or progressed hemodynamically significant stenosis compared to the 2014 MRA.  Electronically Signed: By: Lars Pinks M.D. On: 12/17/2013 15:18   Dg Chest Port 1 View  01/14/2014   CLINICAL DATA:  Altered mental status.  EXAM: PORTABLE CHEST - 1 VIEW  COMPARISON:  12/17/2013  FINDINGS: Patient is rotated to the right. Lungs are adequately inflated without focal consolidation or effusion. Cardiomediastinal silhouette and remainder the exam is unchanged.  IMPRESSION: No active disease.   Electronically Signed   By: Marin Olp M.D.   On: 01/14/2014 16:34    CBC  Recent Labs Lab 01/14/14 1511 01/14/14 1611 01/15/14 0544 01/16/14 0628  WBC 13.5*  --  9.9 8.2  HGB 14.3 16.0* 12.3 11.7*  HCT 43.0 47.0* 37.0 35.5*  PLT 747*  --  720* 776*  MCV 82.4  --  81.0 80.9  MCH 27.4  --  26.9 26.7  MCHC 33.3  --  33.2 33.0  RDW 17.2*  --  17.2* 17.2*  LYMPHSABS 1.4  --   --   --   MONOABS 1.9*  --   --   --   EOSABS 0.0  --   --   --   BASOSABS 0.0  --   --   --     Chemistries   Recent Labs Lab 01/14/14 1511 01/14/14 1611 01/15/14 0544 01/16/14 0628  NA 146 147 149* 150*  K 3.7 3.4* 3.9 3.0*  CL 104 109 111 110  CO2 23  --  22 24  GLUCOSE 126* 133* 112* 94  BUN 19 19 16 14   CREATININE 0.49* 0.60 0.42* 0.45*  CALCIUM 10.4  --  9.3 9.4  AST 22  --   --   --   ALT 8  --   --   --   ALKPHOS 75  --   --   --   BILITOT 1.0  --   --   --     ------------------------------------------------------------------------------------------------------------------ CrCl is unknown because both a height and weight (above a minimum accepted value) are required for this calculation. ------------------------------------------------------------------------------------------------------------------  Recent Labs  01/15/14 0544  HGBA1C 5.7*   ------------------------------------------------------------------------------------------------------------------  Recent Labs  01/15/14 0544  CHOL 142  HDL 63  LDLCALC 65  TRIG 70  CHOLHDL 2.3   ------------------------------------------------------------------------------------------------------------------ No results found for this basename: TSH, T4TOTAL, FREET3, T3FREE, THYROIDAB,  in the last 72 hours ------------------------------------------------------------------------------------------------------------------ No results found for this basename: VITAMINB12, FOLATE, FERRITIN, TIBC, IRON, RETICCTPCT,  in the last 72 hours  Coagulation profile  Recent Labs Lab 01/14/14 1511  INR 1.08    No results found for this basename: DDIMER,  in the last 72 hours  Cardiac Enzymes No results found for this basename: CK, CKMB, TROPONINI, MYOGLOBIN,  in the last 168 hours ------------------------------------------------------------------------------------------------------------------ No components found with this basename: POCBNP,     [facility] A D.O. on 01/16/2014 at 10:35 AM  Between 7am to 7pm - Pager - [phone]  After 7pm go to [email or web address] - password [email or web address]  And look for the night coverage person covering for me after hours  [facility] Office  [phone]

## 2014-01-16 NOTE — Plan of Care (Signed)
Problem: Acute Treatment Outcomes Goal: Neuro exam at baseline or improved Outcome: Progressing Pt alert

## 2014-01-16 NOTE — Progress Notes (Signed)
STROKE TEAM PROGRESS NOTE   HISTORY Cristina Patton is an 80 y.o. 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 Work up revealed new left centrum semiovale nonhemorrhagic infarcts and subacute right centrum semiovale infarcts. MRA brain showed diffuse ntracranial atherosclerotic disease most notable involving medium size vessels and branch vessels but also involving the basilar artery and supraclinoid segment left internal internal carotid artery.  Patient is alert and awake but confused and thus all clinical information was obtained form the medical record.  Per record review, " pt noted to be progressively more lethargic since earlier in the day, no reported fevers, chills, no noted dyspnea, no abd concerns reported. In ED, pt noted to be lethargic, unable to follow any commands and unable to answer questions".  Serologies today significant for mild hypernatremia.  At this moment she is awake and follows simple commands inconsistently.  Denies HA, vertigo, double vision, difficulty swallowing, slurred speech, language or vision impairment.  SUBJECTIVE (INTERVAL HISTORY) No one is at the bedside. Her nephew is the healthcare surrogate. She came from [facility]. Not able to give history. Was told by primary team that before admission, she was sitting in wheelchair in NH and fell forward from wheelchair and was found to have AMS which prompted this admission.  OBJECTIVE Temp:  [97.6 F (36.4 C)-99.2 F (37.3 C)] 99.2 F (37.3 C) (09/16 2120) Pulse Rate:  [97-115] 99 (09/16 2120) Cardiac Rhythm:  [-] Sinus tachycardia (09/16 0833) Resp:  [16-20] 18 (09/16 2120) BP: (147-162)/(57-68) 162/66 mmHg (09/16 2120) SpO2:   [96 %-99 %] 96 % (09/16 2120)   Recent Labs Lab 01/14/14 1648  GLUCAP 125*    Recent Labs Lab 01/14/14 1511 01/14/14 1611 01/15/14 0544 01/16/14 0628  NA 146 147 149* 150*  K 3.7 3.4* 3.9 3.0*  CL 104 109 111 110  CO2 23  --  22 24  GLUCOSE 126* 133* 112* 94  BUN 19 19 16 14   CREATININE 0.49* 0.60 0.42* 0.45*  CALCIUM 10.4  --  9.3 9.4    Recent Labs Lab 01/14/14 1511  AST 22  ALT 8  ALKPHOS 75  BILITOT 1.0  PROT 8.5*  ALBUMIN 3.5    Recent Labs Lab 01/14/14 1511 01/14/14 1611 01/15/14 0544 01/16/14 0628  WBC 13.5*  --  9.9 8.2  NEUTROABS 10.2*  --   --   --   HGB 14.3 16.0* 12.3 11.7*  HCT 43.0 47.0* 37.0 35.5*  MCV 82.4  --  81.0 80.9  PLT 747*  --  720* 776*   No results found for this basename: CKTOTAL, CKMB, CKMBINDEX, TROPONINI,  in the last 168 hours  Recent Labs  01/14/14 1511  LABPROT 14.0  INR 1.08    Recent Labs  01/14/14 1705  COLORURINE AMBER*  LABSPEC 1.023  PHURINE 6.0  GLUCOSEU NEGATIVE  HGBUR NEGATIVE  BILIRUBINUR MODERATE*  KETONESUR >80*  PROTEINUR 100*  UROBILINOGEN 0.2  NITRITE NEGATIVE  LEUKOCYTESUR NEGATIVE       Component Value Date/Time   CHOL 142 01/15/2014 0544   TRIG 70 01/15/2014 0544   HDL 63 01/15/2014 0544   CHOLHDL 2.3 01/15/2014 0544   VLDL 14 01/15/2014 0544   LDLCALC 65 01/15/2014 0544  Lab Results  Component Value Date   HGBA1C 5.7* 01/15/2014      Component Value Date/Time   LABOPIA NONE DETECTED 12/17/2013 1310   COCAINSCRNUR NONE DETECTED 12/17/2013 1310   LABBENZ NONE DETECTED 12/17/2013 1310   AMPHETMU NONE DETECTED 12/17/2013 1310   THCU NONE DETECTED 12/17/2013 1310   LABBARB NONE DETECTED 12/17/2013 1310    No results found for this basename: ETH,  in the last 168 hours   Ct Head Wo Contrast  01/14/2014  IMPRESSION: 1. Asymmetric focal subcortical area of white matter hypoattenuation in the high left frontal lobe is concerning for an acute nonhemorrhagic infarct. MRI could be used for  confirmation. 2. Otherwise stable atrophy and white matter disease. 3. Atherosclerosis.     Mri and Mra Head Wo Contrast  01/14/2014   IMPRESSION: MRI HEAD:  New left centrum semiovale nonhemorrhagic infarcts.  Subacute right centrum semiovale infarcts.  Remote left occipital lobe infarct.  Prominent small vessel disease type changes.  No intracranial hemorrhage.  Partial opacification right mastoid air cells without obstructing mass seen causing eustachian tube dysfunction.  MRA HEAD FINDINGS  Intracranial atherosclerotic type changes most notable involving medium size vessels and branch vessels but also involving the basilar artery and supraclinoid segment left internal internal carotid artery as detailed above.     CUS 12/2013 - Bilateral: 1-39% ICA stenosis. Vertebral artery flow is antegrade.  2D ehco 12/2013 - - Left ventricle: The cavity size was normal. Systolic function was normal. The estimated ejection fraction was in the range of 55% to 60%. Wall motion was normal; there were no regional wall motion abnormalities. - Aortic valve: Moderate thickening and calcification, consistent with sclerosis. There was trivial regurgitation. - Mitral valve: Severely calcified annulus. There was trivial regurgitation. - Tricuspid valve: There was trivial regurgitation. - Pulmonic valve: There was mild regurgitation.    PHYSICAL EXAM General - thin, well developed, fetal position in bed. She was able to answer some simple question and followed some simple commands.  Ophthalmologic - not cooperative for exam.  Cardiovascular - Regular rate and rhythm with no murmur.  Mental Status:  Awake alert, very lethargic and dysarthric, orientated only to self, not to place, time or people. She was able to answer some simple questiosn and followed some simple commands. Grossly intact repeating, naming, expression and comprehension, but very lethargic.  Cranial Nerves:  II: visual field full  III,IV, VI:  EOMI, PERRL.  V,VII: mild left facial droop.  VIII: hearing grossly symmetrical bilaterally  IX,X: gag reflex present  XI: bilateral shoulder shrug no tested  XII: tongue in middle on protrusion  Motor:  left UE 2/5, and BLE 3/5 with painful stimulation, right UE spontaneous movement against gravity. BLE increased muscle tone.  Sensory: symmetrical  Deep Tendon Reflexes:  1+ all over  Plantars:  Right: downgoing Left: mute  Cerebellar:  Grossly intact FTN RUE.  Gait:  Not tested for safety   ASSESSMENT/PLAN  Ms. ALVINE MOSTAFA is a 80 y.o. female with history of HTN, chronic left occipital stroks was just discharged from [facility] in 12/2013 due to a new right A2 distribution scattered infarcts. She was admitted this time with AMS found to have subacute right and acute left semi ovale stroke. Stroke work up underway.  Stroke - acute left and subacute right semiovale, chronic left occipital infarcts felt to be likely thrombotic secondary to diffuse advanced small vessel disease. However, embolic stroke not able to completely excluded. At this time, she was  not felt to be a good candidate for anticoagulation due to overall medical condition and fall risk even in NH  aspirin 81 mg orally every day and clopidogrel 75 mg orally every day prior to admission, now on aspirin 325 mg orally every day - would like to continue ASA and plavix for another 2 months and then plavix alone.  MRI  Acute left and subacute right semiovale strokes.  MRA  Moderate to marked tandem stenosis A2 segment anterior cerebral artery greater on the right. Moderate narrowing proximal M2 segment and marked narrowing M3 segment right middle cerebral artery branches. Mild to moderate narrowing left middle cerebral artery branches.   Carotid Doppler unremarkable in 12/2013  2D Echo unremarkable in 12/2013  LDL 65, no statin necessary as meeting goal of LDL <100  HgbA1c 5.7 WNL  lovenox for VTE  prophylaxis  Dysphagia .   Activity as tolerated  Hypertension   Home meds:  norvasc and metoprolol. Resumed in hospital  Increase metoprolol to 50mg  bid  BP 147-170 past 24h (01/16/2014 @ 10:19 PM)  SBP goal normotensive   Unstable  Patient counseled to be compliant with her blood pressure medications  Hyperlipidemia  Home meds:  lipitor. Resumed in hospital  LDL 65   LDL goal < 100  Other Stroke Risk Factors Advanced age   Hospital day # 2  Rosalin Hawking, MD PhD Stroke Neurology 01/16/2014 10:55 PM    To contact Stroke Continuity provider, please refer to [email or web address] After hours, contact General Neurology

## 2014-01-17 DIAGNOSIS — E870 Hyperosmolality and hypernatremia: Secondary | ICD-10-CM

## 2014-01-17 DIAGNOSIS — E860 Dehydration: Secondary | ICD-10-CM

## 2014-01-17 DIAGNOSIS — Z8673 Personal history of transient ischemic attack (TIA), and cerebral infarction without residual deficits: Secondary | ICD-10-CM

## 2014-01-17 DIAGNOSIS — I10 Essential (primary) hypertension: Secondary | ICD-10-CM

## 2014-01-17 LAB — BASIC METABOLIC PANEL
ANION GAP: 16 — AB (ref 5–15)
Anion gap: 13 (ref 5–15)
BUN: 15 mg/dL (ref 6–23)
BUN: 14 mg/dL (ref 6–23)
CO2: 25 mEq/L (ref 19–32)
CO2: 26 meq/L (ref 19–32)
Calcium: 9.3 mg/dL (ref 8.4–10.5)
Calcium: 8.9 mg/dL (ref 8.4–10.5)
Chloride: 114 mEq/L — ABNORMAL HIGH (ref 96–112)
Chloride: 115 mEq/L — ABNORMAL HIGH (ref 96–112)
Creatinine, Ser: 0.47 mg/dL — ABNORMAL LOW (ref 0.50–1.10)
Creatinine, Ser: 0.42 mg/dL — ABNORMAL LOW (ref 0.50–1.10)
GFR calc non Af Amer: 88 mL/min — ABNORMAL LOW (ref >90)
GFR calc non Af Amer: >90 mL/min (ref >90)
Glucose, Bld: 123 mg/dL — ABNORMAL HIGH (ref 70–99)
Glucose, Bld: 111 mg/dL — ABNORMAL HIGH (ref 70–99)
POTASSIUM: 3.4 meq/L — AB (ref 3.7–5.3)
POTASSIUM: 3.9 meq/L (ref 3.7–5.3)
Sodium: 155 mEq/L — ABNORMAL HIGH (ref 137–147)
Sodium: 154 mEq/L — ABNORMAL HIGH (ref 137–147)

## 2014-01-17 MED ORDER — COUMADIN BOOK
Freq: Once | Status: AC
  Administered 2014-01-18: 12:00:00
  Filled 2014-01-17 (×3): qty 1

## 2014-01-17 MED ORDER — POTASSIUM CHLORIDE 10 MEQ/100ML IV SOLN
10.0000 meq | INTRAVENOUS | Status: AC
  Administered 2014-01-17 (×3): 10 meq via INTRAVENOUS
  Filled 2014-01-17 (×2): qty 100

## 2014-01-17 MED ORDER — WARFARIN VIDEO: Freq: Once | Status: DC

## 2014-01-17 MED ORDER — SODIUM CHLORIDE 0.45 % IV SOLN
INTRAVENOUS | Status: DC
  Administered 2014-01-17 – 2014-01-18 (×3): via INTRAVENOUS

## 2014-01-17 MED ORDER — ENSURE PUDDING PO PUDG
1.0000 | Freq: Three times a day (TID) | ORAL | Status: DC
Start: 2014-01-17 — End: 2014-01-19
  Administered 2014-01-18 (×2): 1 via ORAL

## 2014-01-17 MED ORDER — WARFARIN SODIUM 5 MG PO TABS: 5.0000 mg | ORAL_TABLET | Freq: Once | ORAL | Status: DC

## 2014-01-17 NOTE — Progress Notes (Signed)
Speech Language Pathology Treatment: Dysphagia  Patient Details Name: CANDA PODGORSKI MRN: [id number] DOB: October 24, 1926 Today's Date: 01/17/2014 Time: 3664-4034 SLP Time Calculation (min): 20 min  Assessment / Plan / Recommendation Clinical Impression  Pt continues to demonstrte prolonged oral holding and multiple swallows. Given maximal tactile cueing (dry spoon) she is inconsistently able to trigger a swallow. Her oral intake is inadequate due to the protracted amout of time it takes her to finish a few bitesl. Recommend a consult with pallitative care as pt's prognosis for improvement in the short term is poor. SLP discussed short term methods of improving hydration to improve mental status and swallow function  with pt's nurse. SLP will follow up with further diet checks and staff education.   HPI HPI: ARBELL WYCOFF is a 80 y.o. female with a past medical history of stroke, hypertension, which appears to be very poorly controlled, who resides at home. Her stroke was in November of 2014 involving the right inferior cerebellar area. There was a nonhemorrhagic infarct. Following this stroke she spent some time in a nursing facility. Subsequently, she was able to go back home. She presents to the hospital after she was brought in by family due to increasing weakness over the last few days. Patient appears to have some degree of confusion and is unable to provide much history.  Previous notes reveal that patient  was able to move her left upper and lower extremities, despite the stroke, but seems unable to do so now. Her nursing home reports that she is dysphagic and has been less arouseable. She could not eat, even with speech therapy assisting. Food had to removed with spoon. Pt suspect for aspiration pneumonia. MRI shows new left centrum semiovale nonhemorrhagic infarcts, subacute right centrum semiovale infarcts, remote left occipital lobe infarct, prominent small vessel disease type changes,  partial opacification right mastoid air cells without obstructing, mass seen causing eustachian tube dysfunction.    Pertinent Vitals Pain Assessment: No/denies pain  SLP Plan  Continue with current plan of care    Recommendations Diet recommendations: Dysphagia 1 (puree);Thin liquid Liquids provided via: Cup;Teaspoon Medication Administration: Crushed with puree Supervision: Full supervision/cueing for compensatory strategies;Trained caregiver to feed patient Postural Changes and/or Swallow Maneuvers: Seated upright 90 degrees              Oral Care Recommendations: Oral care BID Follow up Recommendations: 24 hour supervision/assistance Plan: Continue with current plan of care    GO     Eden Emms 01/17/2014, 8:44 AM

## 2014-01-17 NOTE — Progress Notes (Signed)
[facility]                                                                              Patient Demographics  Cristina Patton, is a 80 y.o. female, DOB - 01/11/1927, [id number]  Admit date - 01/14/2014   Admitting Physician Robbie Lis, MD  Outpatient Primary MD for the patient is PROVIDER NOT [place]  LOS - 3   Chief Complaint  Patient presents with  . Altered Mental Status      HPI on 01/14/2014 80 y.o. female with dementia, HTN, recent admission fro CVA and aspiration PNA in August 2015, residual left hemiparesis since November 2014, presented from [facility] for evaluation of worsening lethargy. Please note that patient was obtunded at the time of the arrival to the [facility] and was unable to provide any history, most of the details were obtained from ED doctor and available records from SNF. Per record review, patient noted to be progressively more lethargic since earlier in the day, no reported fevers, chills, no noted dyspnea, no abd concerns reported.   In ED, patient noted to be lethargic, unable to follow any commands and unable to answer questions. CT head notable for asymmetric focal subcortical area of white matter hypoattenuation in the high left frontal lobe, concerning for an acute nonhemorrhagic infarct. MRI requested and currently pending. VSS with low grade fever 99.3 F. Blood work notable for mild leukocytosis 13.5, K 3.4. Neurology consulted by ED doctor and [place] asked to admit for further evaluation.    Assessment & Plan   Acute Encephalopathy secondary to Acute CVA -CT head :Asymmetric focal subcortical area of white matter hypoattenuation   in the high left frontal lobe is concerning for an acute  nonhemorrhagic infarct -MRI head: New left centrum semiovale nonhemorrhagic infarcts -Neurology consulted and appreciated, pending further recommendations -LDL 65, hemoglobin A1c 5.7. -Echocardiogram 12/19/2013: EF 55-60% -Carotid doppler 12/20/2013: 1-39%  ICA stenosis bilaterally. Antegrade vertebral flow. -Dr Erlinda Hong discussed with nephew Barnabas Lister, plan to start coumadin per pharmacy to dose. Discontinue aspirin when INR at goal.  -As an outpatient was Aspirin 325mg  and plavix 75mg  daily -Started on Dysphagia 1 diet 9-15.  Hypernatremia; patient not drinking enough, not eating well, she hold food in her mouth. I will start half NS, would not start D 5 IV fluid due to recent stroke. I  discussed with nephew Mena Goes POA, option to speak with palliative care for goals of care, discussed about MOST form, nutrition...  Sinus Tachycardia: will resume home dose metoprolol. Improved.   Hypokalemia: replete IV.   Severe protein calorie malnutrition -From underlying CVA and dementia -Last speech recommendation from prior hospitalization recommended dysphagia 1 diet -continue aspiration precautions -Dysphagia 1 diet.   Hypertension -Continue Norvasc and metoprolol.  -hydralazine when necessary  Leukocytosis with low-grade fever -Resolved -UA and chest x-ray show no infectious etiology -Continue to monitor  Functional quadriplegia -Complicated by failure to thrive and deconditioning -PT and OT consulted  Thrombocytosis, chronic. On aspirin.   Code Status: DNR.   Family Communication: nephew Mena Goes.   Disposition Plan: SNF when hypernatremia improved.   Time Spent in minutes   30 minutes  Procedures  None  Consults   Neurology  DVT Prophylaxis  Lovenox  Lab Results  Component Value Date   PLT 776* 01/16/2014    Medications  Scheduled Meds: .  stroke: mapping our early stages of recovery book   Does not apply Once  . amLODipine  10 mg Oral q morning - 10a  . aspirin EC  81 mg Oral Daily  . clopidogrel  75 mg Oral Daily  . enoxaparin (LOVENOX) injection  40 mg Subcutaneous QHS  . feeding supplement (ENSURE)  1 Container Oral TID BM  . metoprolol tartrate  50 mg Oral BID  . potassium chloride  10 mEq Intravenous Q1 Hr x  3  . sodium chloride  3 mL Intravenous Q12H   Continuous Infusions: . sodium chloride 75 mL/hr at 01/17/14 1431   PRN Meds:.sodium chloride, hydrALAZINE, sodium chloride  Antibiotics    Anti-infectives   None      Subjective:   very slow to respond. Denies chest pain or shortness of breath at this time.  She denies abdominal pain. Not very talkative.   Objective:   Filed Vitals:   01/17/14 0150 01/17/14 0547 01/17/14 0937 01/17/14 1350  BP: 156/87 168/61 156/69 154/61  Pulse: 94 100 101 110  Temp: 98.6 F (37 C) 98.2 F (36.8 C) 97.7 F (36.5 C) 97.7 F (36.5 C)  TempSrc: Oral Oral Oral Oral  Resp: 18 18 18 18   SpO2: 98% 98% 100% 97%    Wt Readings from Last 3 Encounters:  12/17/13 52.2 kg (115 lb 1.3 oz)  09/01/13 52.164 kg (115 lb)  04/03/13 56 kg (123 lb 7.3 oz)     Intake/Output Summary (Last 24 hours) at 01/17/14 1620 Last data filed at 01/17/14 1300  Gross per 24 hour  Intake    360 ml  Output      0 ml  Net    360 ml    Exam  General: NAD, appears stated age  HEENT: NCAT, PERRLA, Anicteic Sclera,   Neck: Supple, no JVD, no masses  Cardiovascular: S1 S2 auscultated, no rubs, murmurs or gallops. Regular rate and rhythm.  Respiratory: Clear to auscultation bilaterally with equal chest rise  Abdomen: Soft, nontender, nondistended, + bowel sounds  Extremities: warm dry without cyanosis clubbing or edema  Neuro: AAOx3, not oriented to time.  Cannot follow commands consistently. Has mild left facial droop. Strength testing unable to complete.  Data Review   Micro Results Recent Results (from the past 240 hour(s))  CULTURE, BLOOD (ROUTINE X 2)     Status: None   Collection Time    01/14/14  3:36 PM      Result Value Ref Range Status   Specimen Description BLOOD ARM RIGHT   Final   Special Requests BOTTLES DRAWN AEROBIC AND ANAEROBIC 5CC   Final   Culture  Setup Time     Final   Value: 01/14/2014 20:22     Performed at [facility]   Culture     Final   Value:        BLOOD CULTURE RECEIVED NO GROWTH TO DATE CULTURE WILL BE HELD FOR 5 DAYS BEFORE ISSUING A FINAL NEGATIVE REPORT     Performed at [facility]   Report Status PENDING   Incomplete  CULTURE, BLOOD (ROUTINE X 2)     Status: None   Collection Time    01/14/14  3:49 PM      Result Value Ref Range Status  Specimen Description BLOOD LEFT FOREARM   Final   Special Requests BOTTLES DRAWN AEROBIC AND ANAEROBIC 5CC   Final   Culture  Setup Time     Final   Value: 01/14/2014 20:22     Performed at [facility]   Culture     Final   Value:        BLOOD CULTURE RECEIVED NO GROWTH TO DATE CULTURE WILL BE HELD FOR 5 DAYS BEFORE ISSUING A FINAL NEGATIVE REPORT     Performed at [facility]   Report Status PENDING   Incomplete    Radiology Reports Dg Chest 2 View  12/17/2013   CLINICAL DATA:  Altered mental status, history stroke, hypertension  EXAM: CHEST  2 VIEW  COMPARISON:  04/02/2013  FINDINGS: Upper normal heart size.  Normal mediastinal contours and pulmonary vascularity.  Rotation to the LEFT.  Mild bibasilar atelectasis.  LEFT upper lobe infiltrate present.  Remaining lungs clear.  No pleural effusion or pneumothorax.  Bones demineralized.  Atherosclerotic calcification aortic arch.  IMPRESSION: LEFT upper lobe infiltrate and mild bibasilar atelectasis.   Electronically Signed   By: Lavonia Dana M.D.   On: 12/17/2013 11:20   Ct Head Wo Contrast  01/14/2014   CLINICAL DATA:  Altered mental status. Decreased level of consciousness. Recent infarcts.  EXAM: CT HEAD WITHOUT CONTRAST  TECHNIQUE: Contiguous axial images were obtained from the base of the skull through the vertex without intravenous contrast.  COMPARISON:  MRI brain 12/17/2013  FINDINGS: Asymmetric subcortical white matter hypoattenuation in the high left frontal lobe on images 20 and 21 of series 201 is concerning for an acute nonhemorrhagic infarct. Mild generalized  atrophy and white matter disease is otherwise stable. There is no hemorrhage or mass. Remote infarcts of the basal ganglia are noted bilaterally.  The ventricles are proportionate to the degree of atrophy. No significant extra-axial fluid collection is present. Atherosclerotic changes are again noted within the cavernous carotid arteries.  IMPRESSION: 1. Asymmetric focal subcortical area of white matter hypoattenuation in the high left frontal lobe is concerning for an acute nonhemorrhagic infarct. MRI could be used for confirmation. 2. Otherwise stable atrophy and white matter disease. 3. Atherosclerosis.   Electronically Signed   By: Lawrence Santiago M.D.   On: 01/14/2014 16:15   Ct Head Wo Contrast  12/17/2013   CLINICAL DATA:  Subjective decrease in overall activity, with increasing LEFT-sided weakness. History of prior stroke. History of hypertension.  EXAM: CT HEAD WITHOUT CONTRAST  TECHNIQUE: Contiguous axial images were obtained from the base of the skull through the vertex without contrast.  COMPARISON:  MR head 03/22/2013.  CT head 03/20/2013.  FINDINGS: No evidence for acute infarction, hemorrhage, mass lesion, hydrocephalus, or extra-axial fluid. Generalized cerebral and cerebellar atrophy. Chronic microvascular ischemic change affects the periventricular and subcortical white matter. There is a remote RIGHT cerebellar infarct which is difficult to visualize. The calvarium is intact. Vascular calcification. There is no acute sinus disease. Trace RIGHT mastoid effusion. When technique differences are considered, similar appearance to priors.  IMPRESSION: No acute findings.  Age-related changes as described.   Electronically Signed   By: Rolla Flatten M.D.   On: 12/17/2013 11:21   Mr Jodene Nam Head Wo Contrast  01/14/2014   CLINICAL DATA:  80 year old female with decreased level of consciousness. High blood pressure.  EXAM: MRI HEAD WITHOUT CONTRAST  MRA HEAD WITHOUT CONTRAST  TECHNIQUE: Multiplanar,  multiecho pulse sequences of the brain and surrounding structures were  obtained without intravenous contrast. Angiographic images of the head were obtained using MRA technique without contrast.  COMPARISON:  01/14/2014 CT.  12/17/2013 MR.  FINDINGS: MRI HEAD FINDINGS  New left centrum semiovale nonhemorrhagic infarcts.  Subacute right centrum semiovale infarcts.  Remote left occipital lobe infarct.  Prominent small vessel disease type changes.  No intracranial hemorrhage.  No intracranial mass lesion noted on this unenhanced exam.  Global atrophy without hydrocephalus.  Partially empty non expanded sella incidentally noted.  Partial opacification right mastoid air cells without obstructing mass seen causing eustachian tube dysfunction.  MRA HEAD FINDINGS  Fusiform ectasia pre cavernous segment right internal carotid artery. Mild narrowing and irregularity  Ectatic left internal carotid artery distal vertical cervical segment.  Mild ectasia and slight irregularity with minimal narrowing involving portions of the cavernous segment of the internal carotid artery bilaterally.  Mild narrowing supraclinoid segment left internal carotid artery.  Moderate to marked tandem stenosis A2 segment anterior cerebral artery greater on the right.  Moderate narrowing proximal M2 segment and marked narrowing M3 segment right middle cerebral artery branches. Mild to moderate narrowing left middle cerebral artery branches.  Left vertebral artery ends in a posterior inferior cerebellar artery distribution.  Ectatic right vertebral artery without significant narrowing.  Nonvisualized right posterior inferior cerebellar artery.  Mild to moderate tandem stenosis basilar artery.  Nonvisualized left anterior inferior cerebellar artery.  Moderate to marked narrowing proximal left superior cerebellar artery.  Moderate to marked tandem stenosis portions of the posterior cerebral artery bilaterally.  No aneurysm visualized.  IMPRESSION: MRI HEAD:   New left centrum semiovale nonhemorrhagic infarcts.  Subacute right centrum semiovale infarcts.  Remote left occipital lobe infarct.  Prominent small vessel disease type changes.  No intracranial hemorrhage.  Partial opacification right mastoid air cells without obstructing mass seen causing eustachian tube dysfunction.  MRA HEAD FINDINGS  Intracranial atherosclerotic type changes most notable involving medium size vessels and branch vessels but also involving the basilar artery and supraclinoid segment left internal internal carotid artery as detailed above.   Electronically Signed   By: Chauncey Cruel M.D.   On: 01/14/2014 20:57   [name] VF Contrast  12/17/2013   ADDENDUM REPORT: 12/17/2013 15:25  ADDENDUM: Study discussed by telephone with Dr. Carmin Muskrat on 12/17/2013 at 15:20 hours.   Electronically Signed   By: Lars Pinks M.D.   On: 12/17/2013 15:25   12/17/2013   CLINICAL DATA:  80 year old female with increasing weakness and confusion. Initial encounter. History of small right inferior cerebellar infarct in 2014.  EXAM: MRI HEAD WITHOUT CONTRAST  MRA HEAD WITHOUT CONTRAST  TECHNIQUE: Multiplanar, multiecho pulse sequences of the brain and surrounding structures were obtained without intravenous contrast. Angiographic images of the head were obtained using MRA technique without contrast.  COMPARISON:  Head CT without contrast 1050 hr today. Brain MRI and MRA 03/22/2013.  FINDINGS: MRI HEAD FINDINGS  Patchy clustered cortical and white matter restricted diffusion in the right superior frontal gyrus, mostly the pre motor area (see series 5, images 23-28).  No left hemisphere or posterior fossa restricted diffusion identified.  Major intracranial vascular flow voids are stable.  Mild if any associated T2 and FLAIR hyperintensity to correspond with the acute diffusion abnormality. Underlying Patchy and confluent white matter T2 and FLAIR signal changes are stable. No acute intracranial hemorrhage  identified. No midline shift, mass effect, or evidence of intracranial mass lesion. No ventriculomegaly. Stable pituitary gland, cervicomedullary junction, and visualized cervical spine. Normal bone marrow  signal.  New mild right mastoid effusion. Rightward gaze deviation. Stable paranasal sinuses. Visualized scalp soft tissues are within normal limits.  MRA HEAD FINDINGS  Stable antegrade flow in the posterior circulation with dominant distal right vertebral artery. Widespread basilar artery irregularity does appear progressed along with mild multifocal basilar artery stenosis. The moderate stenosis just proximal to the SCA origins appears stable.  Widespread bilateral PCA irregularity with high-grade distal P2 stenoses appears stable. Posterior communicating arteries are diminutive or absent.  Stable antegrade flow in the left ICA siphon. No left ICA stenosis identified. Left carotid terminus MCA, and left ACA origins remain within normal limits. Increased irregularity of the visualized left ACA branches, but with Stable distal left ACA flow. Moderate irregularity of the left MCA branches is stable, with stable distal left MCA flow.  Stable antegrade flow in the right ICA siphon but increased irregularity in the vertical P each wrists segment now resulting in mild stenosis. Stable right ICA terminus infundibulum. Patent right ICA terminus with stable right MCA and ACA origins.  New high-grade irregularity and attenuation of flow signal in the low right ACA distal A2 segment, with poor distal right ACA flow signal.  Proximal right M2 branch irregularity and stenosis appears not significantly changed since 2014. Right MCA branch flow signal appears stable, with no interval major right MCA branch occlusion identified.  IMPRESSION: 1. Patchy acute infarcts in the right ACA / MCA watershed territory involving the superior frontal gyrus, mostly in the pre motor area. No mass effect or hemorrhage. 2. New distal right ACA  occlusion and high-grade irregularity of the A2 segment, concordant with #1. Right MCA M2 branch atherosclerosis and flow appears stable since 2014. 3. Interval mild progression of right ICA siphon and basilar artery atherosclerosis, but no other new or progressed hemodynamically significant stenosis compared to the 2014 MRA.  Electronically Signed: By: Lars Pinks M.D. On: 12/17/2013 15:18   Mr Brain Wo Contrast  01/14/2014   CLINICAL DATA:  80 year old female with decreased level of consciousness. High blood pressure.  EXAM: MRI HEAD WITHOUT CONTRAST  MRA HEAD WITHOUT CONTRAST  TECHNIQUE: Multiplanar, multiecho pulse sequences of the brain and surrounding structures were obtained without intravenous contrast. Angiographic images of the head were obtained using MRA technique without contrast.  COMPARISON:  01/14/2014 CT.  12/17/2013 MR.  FINDINGS: MRI HEAD FINDINGS  New left centrum semiovale nonhemorrhagic infarcts.  Subacute right centrum semiovale infarcts.  Remote left occipital lobe infarct.  Prominent small vessel disease type changes.  No intracranial hemorrhage.  No intracranial mass lesion noted on this unenhanced exam.  Global atrophy without hydrocephalus.  Partially empty non expanded sella incidentally noted.  Partial opacification right mastoid air cells without obstructing mass seen causing eustachian tube dysfunction.  MRA HEAD FINDINGS  Fusiform ectasia pre cavernous segment right internal carotid artery. Mild narrowing and irregularity  Ectatic left internal carotid artery distal vertical cervical segment.  Mild ectasia and slight irregularity with minimal narrowing involving portions of the cavernous segment of the internal carotid artery bilaterally.  Mild narrowing supraclinoid segment left internal carotid artery.  Moderate to marked tandem stenosis A2 segment anterior cerebral artery greater on the right.  Moderate narrowing proximal M2 segment and marked narrowing M3 segment right middle  cerebral artery branches. Mild to moderate narrowing left middle cerebral artery branches.  Left vertebral artery ends in a posterior inferior cerebellar artery distribution.  Ectatic right vertebral artery without significant narrowing.  Nonvisualized right posterior inferior cerebellar artery.  Mild  to moderate tandem stenosis basilar artery.  Nonvisualized left anterior inferior cerebellar artery.  Moderate to marked narrowing proximal left superior cerebellar artery.  Moderate to marked tandem stenosis portions of the posterior cerebral artery bilaterally.  No aneurysm visualized.  IMPRESSION: MRI HEAD:  New left centrum semiovale nonhemorrhagic infarcts.  Subacute right centrum semiovale infarcts.  Remote left occipital lobe infarct.  Prominent small vessel disease type changes.  No intracranial hemorrhage.  Partial opacification right mastoid air cells without obstructing mass seen causing eustachian tube dysfunction.  MRA HEAD FINDINGS  Intracranial atherosclerotic type changes most notable involving medium size vessels and branch vessels but also involving the basilar artery and supraclinoid segment left internal internal carotid artery as detailed above.   Electronically Signed   By: Chauncey Cruel M.D.   On: 01/14/2014 20:57   Mr Brain Wo Contrast  12/17/2013   ADDENDUM REPORT: 12/17/2013 15:25  ADDENDUM: Study discussed by telephone with Dr. Carmin Muskrat on 12/17/2013 at 15:20 hours.   Electronically Signed   By: Lars Pinks M.D.   On: 12/17/2013 15:25   12/17/2013   CLINICAL DATA:  80 year old female with increasing weakness and confusion. Initial encounter. History of small right inferior cerebellar infarct in 2014.  EXAM: MRI HEAD WITHOUT CONTRAST  MRA HEAD WITHOUT CONTRAST  TECHNIQUE: Multiplanar, multiecho pulse sequences of the brain and surrounding structures were obtained without intravenous contrast. Angiographic images of the head were obtained using MRA technique without contrast.   COMPARISON:  Head CT without contrast 1050 hr today. Brain MRI and MRA 03/22/2013.  FINDINGS: MRI HEAD FINDINGS  Patchy clustered cortical and white matter restricted diffusion in the right superior frontal gyrus, mostly the pre motor area (see series 5, images 23-28).  No left hemisphere or posterior fossa restricted diffusion identified.  Major intracranial vascular flow voids are stable.  Mild if any associated T2 and FLAIR hyperintensity to correspond with the acute diffusion abnormality. Underlying Patchy and confluent white matter T2 and FLAIR signal changes are stable. No acute intracranial hemorrhage identified. No midline shift, mass effect, or evidence of intracranial mass lesion. No ventriculomegaly. Stable pituitary gland, cervicomedullary junction, and visualized cervical spine. Normal bone marrow signal.  New mild right mastoid effusion. Rightward gaze deviation. Stable paranasal sinuses. Visualized scalp soft tissues are within normal limits.  MRA HEAD FINDINGS  Stable antegrade flow in the posterior circulation with dominant distal right vertebral artery. Widespread basilar artery irregularity does appear progressed along with mild multifocal basilar artery stenosis. The moderate stenosis just proximal to the SCA origins appears stable.  Widespread bilateral PCA irregularity with high-grade distal P2 stenoses appears stable. Posterior communicating arteries are diminutive or absent.  Stable antegrade flow in the left ICA siphon. No left ICA stenosis identified. Left carotid terminus MCA, and left ACA origins remain within normal limits. Increased irregularity of the visualized left ACA branches, but with Stable distal left ACA flow. Moderate irregularity of the left MCA branches is stable, with stable distal left MCA flow.  Stable antegrade flow in the right ICA siphon but increased irregularity in the vertical P each wrists segment now resulting in mild stenosis. Stable right ICA terminus  infundibulum. Patent right ICA terminus with stable right MCA and ACA origins.  New high-grade irregularity and attenuation of flow signal in the low right ACA distal A2 segment, with poor distal right ACA flow signal.  Proximal right M2 branch irregularity and stenosis appears not significantly changed since 2014. Right MCA branch flow signal appears stable, with  no interval major right MCA branch occlusion identified.  IMPRESSION: 1. Patchy acute infarcts in the right ACA / MCA watershed territory involving the superior frontal gyrus, mostly in the pre motor area. No mass effect or hemorrhage. 2. New distal right ACA occlusion and high-grade irregularity of the A2 segment, concordant with #1. Right MCA M2 branch atherosclerosis and flow appears stable since 2014. 3. Interval mild progression of right ICA siphon and basilar artery atherosclerosis, but no other new or progressed hemodynamically significant stenosis compared to the 2014 MRA.  Electronically Signed: By: Lars Pinks M.D. On: 12/17/2013 15:18   Dg Chest Port 1 View  01/14/2014   CLINICAL DATA:  Altered mental status.  EXAM: PORTABLE CHEST - 1 VIEW  COMPARISON:  12/17/2013  FINDINGS: Patient is rotated to the right. Lungs are adequately inflated without focal consolidation or effusion. Cardiomediastinal silhouette and remainder the exam is unchanged.  IMPRESSION: No active disease.   Electronically Signed   By: Marin Olp M.D.   On: 01/14/2014 16:34    CBC  Recent Labs Lab 01/14/14 1511 01/14/14 1611 01/15/14 0544 01/16/14 0628  WBC 13.5*  --  9.9 8.2  HGB 14.3 16.0* 12.3 11.7*  HCT 43.0 47.0* 37.0 35.5*  PLT 747*  --  720* 776*  MCV 82.4  --  81.0 80.9  MCH 27.4  --  26.9 26.7  MCHC 33.3  --  33.2 33.0  RDW 17.2*  --  17.2* 17.2*  LYMPHSABS 1.4  --   --   --   MONOABS 1.9*  --   --   --   EOSABS 0.0  --   --   --   BASOSABS 0.0  --   --   --     Chemistries   Recent Labs Lab 01/14/14 1511 01/14/14 1611 01/15/14 0544  01/16/14 0628 01/17/14 0938  NA 146 147 149* 150* 155*  K 3.7 3.4* 3.9 3.0* 3.4*  CL 104 109 111 110 114*  CO2 23  --  22 24 25   GLUCOSE 126* 133* 112* 94 123*  BUN 19 19 16 14 15   CREATININE 0.49* 0.60 0.42* 0.45* 0.47*  CALCIUM 10.4  --  9.3 9.4 9.3  AST 22  --   --   --   --   ALT 8  --   --   --   --   ALKPHOS 75  --   --   --   --   BILITOT 1.0  --   --   --   --    ------------------------------------------------------------------------------------------------------------------ CrCl is unknown because both a height and weight (above a minimum accepted value) are required for this calculation. ------------------------------------------------------------------------------------------------------------------  Recent Labs  01/15/14 0544  HGBA1C 5.7*   ------------------------------------------------------------------------------------------------------------------  Recent Labs  01/15/14 0544  CHOL 142  HDL 63  LDLCALC 65  TRIG 70  CHOLHDL 2.3   ------------------------------------------------------------------------------------------------------------------ No results found for this basename: TSH, T4TOTAL, FREET3, T3FREE, THYROIDAB,  in the last 72 hours ------------------------------------------------------------------------------------------------------------------ No results found for this basename: VITAMINB12, FOLATE, FERRITIN, TIBC, IRON, RETICCTPCT,  in the last 72 hours  Coagulation profile  Recent Labs Lab 01/14/14 1511  INR 1.08    No results found for this basename: DDIMER,  in the last 72 hours  Cardiac Enzymes No results found for this basename: CK, CKMB, TROPONINI, MYOGLOBIN,  in the last 168 hours ------------------------------------------------------------------------------------------------------------------ No components found with this basename: POCBNP,     Kadie Balestrieri A D.O. on 01/17/2014  at 4:20 PM [phone] Between 7am to 7pm -  Pager - [phone]  After 7pm go to [email or web address] - password [email or web address]  And look for the night coverage person covering for me after hours  [facility] Office  [phone]

## 2014-01-17 NOTE — Progress Notes (Signed)
ANTICOAGULATION CONSULT NOTE - Initial Consult  Pharmacy Consult for Warfarin Indication: stroke prevention  No Known Allergies  Patient Measurements:     Vital Signs: Temp: 97.7 F (36.5 C) (09/17 1350) Temp src: Oral (09/17 1350) BP: 154/61 mmHg (09/17 1350) Pulse Rate: 110 (09/17 1350)  Labs:  Recent Labs  01/15/14 0544 01/16/14 0628 01/17/14 0938  HGB 12.3 11.7*  --   HCT 37.0 35.5*  --   PLT 720* 776*  --   CREATININE 0.42* 0.45* 0.47*    The CrCl is unknown because both a height and weight (above a minimum accepted value) are required for this calculation.   Medical History: Past Medical History  Diagnosis Date  . Hypertension   . CVA (cerebral infarction)   . Stroke     Medications:  Prescriptions prior to admission  Medication Sig Dispense Refill  . amLODipine (NORVASC) 10 MG tablet Take 10 mg by mouth every morning.      Marland Kitchen aspirin 325 MG EC tablet Take 325 mg by mouth every morning.      Marland Kitchen atorvastatin (LIPITOR) 20 MG tablet Take 1 tablet (20 mg total) by mouth daily at 6 PM.      . clopidogrel (PLAVIX) 75 MG tablet Take 1 tablet (75 mg total) by mouth daily.      . metoprolol tartrate (LOPRESSOR) 25 MG tablet Take 1 tablet (25 mg total) by mouth 2 (two) times daily.      . polyethylene glycol (MIRALAX / GLYCOLAX) packet Take 17 g by mouth daily.       Scheduled:  .  stroke: mapping our early stages of recovery book   Does not apply Once  . amLODipine  10 mg Oral q morning - 10a  . aspirin EC  81 mg Oral Daily  . enoxaparin (LOVENOX) injection  40 mg Subcutaneous QHS  . feeding supplement (ENSURE)  1 Container Oral TID BM  . metoprolol tartrate  50 mg Oral BID  . potassium chloride  10 mEq Intravenous Q1 Hr x 3  . sodium chloride  3 mL Intravenous Q12H   Infusions:  . sodium chloride 75 mL/hr at 01/17/14 1431    Assessment: 80yo female with recent admission for aspiration PNA and CVA in August 2015 presents from SNF with AMS. Pharmacy is  consulted to dose warfarin for stroke prevention. H/H are slightly low, Plts elevated. INR was 1.08 on 01/14/14.  Goal of Therapy:  INR 2-3 Monitor platelets by anticoagulation protocol: Yes   Plan:  -Start warfarin 5mg  PO tonight x1 -Will bridge with ASA per neurology note -Daily INR/CBC  Cristina Patton. Diona Foley, PharmD Clinical Pharmacist Pager [phone] 01/17/2014,4:30 PM

## 2014-01-17 NOTE — Care Management Note (Addendum)
  Page 1 of 1   01/17/2014     10:49:47 AM CARE MANAGEMENT NOTE 01/17/2014  Patient:  CIIN, BRAZZEL   Account Number:  [id number]  Date Initiated:  01/15/2014  Documentation initiated by:  Lorne Skeens  Subjective/Objective Assessment:   Patient was admitted with CVA. Was recently discharged from the hospital to [facility].     Action/Plan:   Will follow for discharge needs pending PT/Ot evals and physician orders   Anticipated DC Date:     Anticipated DC Plan:  SKILLED NURSING FACILITY  In-house referral  Clinical Social Worker         Choice offered to / List presented to:             Status of service:   Medicare Important Message given?  YES (If response is "NO", the following Medicare IM given date fields will be blank) Date Medicare IM given:  01/17/2014 Medicare IM given by:  Lorne Skeens Date Additional Medicare IM given:   Additional Medicare IM given by:    Discharge Disposition:    Per UR Regulation:  Reviewed for med. necessity/level of care/duration of stay  If discussed at [place] of Stay Meetings, dates discussed:    Comments:  01/17/14 [place], MSN, CM- Medicare IM letter provided.

## 2014-01-17 NOTE — Progress Notes (Signed)
INITIAL NUTRITION ASSESSMENT   Patient meets the criteria for severe MALNUTRITION in the context of chronic illness with PO intake <75% of estimated needs and severe subcutaneous fat and muscle depletion.   DOCUMENTATION CODES Per approved criteria  -Severe malnutrition in the context of chronic illness   INTERVENTION: -Ensure Pudding po TID, each supplement provides 170 kcal and 4 grams of protein -Encourage adequate PO intake.   NUTRITION DIAGNOSIS: Inadequate oral intake related to swallowing difficulty as evidenced by minimal intake of meals.   Goal: Patient will meet >/=90% of estimated nutrition needs  Monitor:  PO intake, weight trends, labs  Reason for Assessment: Low [place]  80 y.o. female  Admitting Dx: Stroke  ASSESSMENT: 80 year old female with history of stroke, hypertension, which appears to be poorly controlled. Presents from [facility] with worsening lethargy.   Unable to obtain history from patient due to lethargy. She has a history of dysphagia, currently on Dysphagia 1, thin-liquid diet. Per SLP, patient inconsistently able to trigger a swallow. Increased time to eat resulting in inadequate oral intake.  Her weight is down 12% in 10 months, which is not significant over the time frame.  Nutrition Focused Physical Exam:  Subcutaneous Fat:  Orbital Region: WNL Upper Arm Region: moderate depletion Thoracic and Lumbar Region: N/A  Muscle:  Temple Region: moderate depletion Clavicle Bone Region: severe depletion Clavicle and Acromion Bone Region: severe depletion Scapular Bone Region: N/A Dorsal Hand: severe depletion Patellar Region: severe depletion Anterior Thigh Region: severe depletion Posterior Calf Region: severe depletion  Edema: none  Height: Ht Readings from Last 1 Encounters:  12/17/13 5' 6.14" (1.68 m)    Weight: Wt Readings from Last 1 Encounters:  12/17/13 115 lb 1.3 oz (52.2 kg)    Ideal Body Weight: 130 pounds  % Ideal  Body Weight: 88%  Wt Readings from Last 10 Encounters:  12/17/13 115 lb 1.3 oz (52.2 kg)  09/01/13 115 lb (52.164 kg)  04/03/13 123 lb 7.3 oz (56 kg)  03/23/13 130 lb 1.1 oz (59 kg)    Usual Body Weight: 130 pounds  % Usual Body Weight: 88%  BMI:  There is no weight on file to calculate BMI.  Estimated Nutritional Needs: Kcal: 1400-1600  Protein: 65-75 gm  Fluid: > 1.5 L  Skin: Intact  Diet Order: Dysphagia 1, thin-liquids  EDUCATION NEEDS: -No education needs identified at this time   Intake/Output Summary (Last 24 hours) at 01/17/14 1234 Last data filed at 01/17/14 0900  Gross per 24 hour  Intake    120 ml  Output      0 ml  Net    120 ml    Last BM: PTA   Labs:   Recent Labs Lab 01/15/14 0544 01/16/14 0628 01/17/14 0938  NA 149* 150* 155*  K 3.9 3.0* 3.4*  CL 111 110 114*  CO2 22 24 25   BUN 16 14 15   CREATININE 0.42* 0.45* 0.47*  CALCIUM 9.3 9.4 9.3  GLUCOSE 112* 94 123*    CBG (last 3)   Recent Labs  01/14/14 1648  GLUCAP 125*    Scheduled Meds: .  stroke: mapping our early stages of recovery book   Does not apply Once  . amLODipine  10 mg Oral q morning - 10a  . aspirin EC  81 mg Oral Daily  . clopidogrel  75 mg Oral Daily  . enoxaparin (LOVENOX) injection  40 mg Subcutaneous QHS  . metoprolol tartrate  50 mg Oral  BID  . sodium chloride  3 mL Intravenous Q12H    Continuous Infusions:   Past Medical History  Diagnosis Date  . Hypertension   . CVA (cerebral infarction)   . Stroke     History reviewed. No pertinent past surgical history.  Larey Seat, RD, LDN Pager #: [phone] After-Hours Pager #: [phone]

## 2014-01-17 NOTE — Progress Notes (Signed)
Supervised and reviewed by Tawni Melkonian MA CCC-SLP  

## 2014-01-17 NOTE — Progress Notes (Addendum)
STROKE TEAM PROGRESS NOTE   HISTORY Cristina Patton is an 80 y.o. female with a past medical history that is relevant for HTN, recent admission to [facility] with acute infarcts in the right ACA / MCA watershed territory and aspiration PNA in August 2015, residual left hemiparesis since November 2014, brought in form a skilled nursing facility for further evaluation and management of altered mental status. Work up revealed new left centrum semiovale nonhemorrhagic infarcts and subacute right centrum semiovale infarcts. MRA brain showed diffuse ntracranial atherosclerotic disease most notable involving medium size vessels and branch vessels but also involving the basilar artery and supraclinoid segment left internal internal carotid artery.  Patient is alert and awake but confused and thus all clinical information was obtained form the medical record.  Per record review, " pt noted to be progressively more lethargic since earlier in the day, no reported fevers, chills, no noted dyspnea, no abd concerns reported. In ED, pt noted to be lethargic, unable to follow any commands and unable to answer questions".  Serologies today significant for mild hypernatremia.  At this moment she is awake and follows simple commands inconsistently.  Denies HA, vertigo, double vision, difficulty swallowing, slurred speech, language or vision impairment.  SUBJECTIVE (INTERVAL HISTORY) Pt's POA Mr. Mena Goes is at bedside. Pt is more awake alert today and eating with assistance. Discussed with POA about coumadin and he is OK with that. Will start coumadin and bridge with ASA.  OBJECTIVE Temp:  [97.7 F (36.5 C)-99.2 F (37.3 C)] 97.7 F (36.5 C) (09/17 1350) Pulse Rate:  [94-110] 110 (09/17 1350) Cardiac Rhythm:  [-] Normal sinus rhythm (09/17 0830) Resp:  [16-18] 18 (09/17 1350) BP: (154-168)/(61-87) 154/61 mmHg (09/17 1350) SpO2:  [96 %-100 %] 97 % (09/17 1350)   Recent Labs Lab 01/14/14 1648  GLUCAP 125*     Recent Labs Lab 01/14/14 1511 01/14/14 1611 01/15/14 0544 01/16/14 0628 01/17/14 0938  NA 146 147 149* 150* 155*  K 3.7 3.4* 3.9 3.0* 3.4*  CL 104 109 111 110 114*  CO2 23  --  22 24 25   GLUCOSE 126* 133* 112* 94 123*  BUN 19 19 16 14 15   CREATININE 0.49* 0.60 0.42* 0.45* 0.47*  CALCIUM 10.4  --  9.3 9.4 9.3    Recent Labs Lab 01/14/14 1511  AST 22  ALT 8  ALKPHOS 75  BILITOT 1.0  PROT 8.5*  ALBUMIN 3.5    Recent Labs Lab 01/14/14 1511 01/14/14 1611 01/15/14 0544 01/16/14 0628  WBC 13.5*  --  9.9 8.2  NEUTROABS 10.2*  --   --   --   HGB 14.3 16.0* 12.3 11.7*  HCT 43.0 47.0* 37.0 35.5*  MCV 82.4  --  81.0 80.9  PLT 747*  --  720* 776*   No results found for this basename: CKTOTAL, CKMB, CKMBINDEX, TROPONINI,  in the last 168 hours No results found for this basename: LABPROT, INR,  in the last 72 hours  Recent Labs  01/14/14 1705  COLORURINE AMBER*  LABSPEC 1.023  PHURINE 6.0  GLUCOSEU NEGATIVE  HGBUR NEGATIVE  BILIRUBINUR MODERATE*  KETONESUR >80*  PROTEINUR 100*  UROBILINOGEN 0.2  NITRITE NEGATIVE  LEUKOCYTESUR NEGATIVE       Component Value Date/Time   CHOL 142 01/15/2014 0544   TRIG 70 01/15/2014 0544   HDL 63 01/15/2014 0544   CHOLHDL 2.3 01/15/2014 0544   VLDL 14 01/15/2014 0544   LDLCALC 65 01/15/2014 0544   Lab Results  Component Value Date   HGBA1C 5.7* 01/15/2014      Component Value Date/Time   LABOPIA NONE DETECTED 12/17/2013 1310   COCAINSCRNUR NONE DETECTED 12/17/2013 1310   LABBENZ NONE DETECTED 12/17/2013 1310   AMPHETMU NONE DETECTED 12/17/2013 1310   THCU NONE DETECTED 12/17/2013 1310   LABBARB NONE DETECTED 12/17/2013 1310    No results found for this basename: ETH,  in the last 168 hours   Ct Head Wo Contrast  01/14/2014  IMPRESSION: 1. Asymmetric focal subcortical area of white matter hypoattenuation in the high left frontal lobe is concerning for an acute nonhemorrhagic infarct. MRI could be used for confirmation. 2.  Otherwise stable atrophy and white matter disease. 3. Atherosclerosis.     Mri and Mra Head Wo Contrast  01/14/2014   IMPRESSION: MRI HEAD:  New left centrum semiovale nonhemorrhagic infarcts.  Subacute right centrum semiovale infarcts.  Remote left occipital lobe infarct.  Prominent small vessel disease type changes.  No intracranial hemorrhage.  Partial opacification right mastoid air cells without obstructing mass seen causing eustachian tube dysfunction.  MRA HEAD FINDINGS  Intracranial atherosclerotic type changes most notable involving medium size vessels and branch vessels but also involving the basilar artery and supraclinoid segment left internal internal carotid artery as detailed above.     CUS 12/2013 - Bilateral: 1-39% ICA stenosis. Vertebral artery flow is antegrade.  2D ehco 12/2013 - - Left ventricle: The cavity size was normal. Systolic function was normal. The estimated ejection fraction was in the range of 55% to 60%. Wall motion was normal; there were no regional wall motion abnormalities. - Aortic valve: Moderate thickening and calcification, consistent with sclerosis. There was trivial regurgitation. - Mitral valve: Severely calcified annulus. There was trivial regurgitation. - Tricuspid valve: There was trivial regurgitation. - Pulmonic valve: There was mild regurgitation.    PHYSICAL EXAM General - thin, well developed, fetal position in bed. She was able to answer some simple question and followed some simple commands.  Ophthalmologic - not cooperative for exam.  Cardiovascular - Regular rate and rhythm with no murmur.  Mental Status:  Awake alert, very lethargic and dysarthric, orientated only to self, not to place, time or people. She was able to answer some simple questiosn and followed some simple commands. Grossly intact repeating, naming, expression and comprehension, but very lethargic.  Cranial Nerves:  II: visual field full  III,IV, VI: EOMI, PERRL.   V,VII: mild left facial droop.  VIII: hearing grossly symmetrical bilaterally  IX,X: gag reflex present  XI: bilateral shoulder shrug no tested  XII: tongue in middle on protrusion  Motor:  left UE 2/5, and BLE 3/5 with painful stimulation, right UE spontaneous movement against gravity. BLE increased muscle tone.  Sensory: symmetrical  Deep Tendon Reflexes:  1+ all over  Plantars:  Right: downgoing Left: mute  Cerebellar:  Grossly intact FTN RUE.  Gait:  Not tested for safety   ASSESSMENT/PLAN  Cristina Patton is a 80 y.o. female with history of HTN, chronic left occipital stroks was just discharged from [facility] in 12/2013 due to a new right A2 distribution scattered infarcts. She was admitted this time with AMS found to have subacute right and acute left semi ovale stroke. Stroke work up underway.  Stroke - acute left and subacute right semiovale, chronic left occipital infarcts felt to be likely embolic stroke although thrombotic secondary to diffuse advanced small vessel disease can not be excluded. Talked with POA Mr. Mena Goes and he is  in agreement to start coumadin for stroke prevention.   aspirin 81 mg orally every day and clopidogrel 75 mg orally every day prior to admission, now on aspirin 81 and plavix orally every day will start coumadin for stroke prevention. Once INR 2-3, ASA and plavix as well as lovenox subq will need to be discontinued.  MRI  Acute left and subacute right semiovale strokes as well as old left occipital infarcts.  MRA  Moderate to marked tandem stenosis A2 segment anterior cerebral artery greater on the right. Moderate narrowing proximal M2 segment and marked narrowing M3 segment right middle cerebral artery branches. Mild to moderate narrowing left middle cerebral artery branches.   Carotid Doppler unremarkable in 12/2013  2D Echo unremarkable in 12/2013  LDL 65, no statin necessary as meeting goal of LDL <100  HgbA1c 5.7 WNL  lovenox for  VTE prophylaxis  Dysphagia .   Activity as tolerated  Therapy recommendations - SNF  Hypertension   Home meds:  norvasc and metoprolol. Resumed in hospital  Increase metoprolol to 50mg  bid  BP 147-170 past 24h (01/17/2014 @ 4:12 PM)  SBP goal normotensive   Unstable  Patient counseled to be compliant with her blood pressure medications  Hyperlipidemia  Home meds:  lipitor. Resumed in hospital  LDL 65   LDL goal < 100  Continue lipitor for stroke prevention  Other Stroke Risk Factors Advanced age Hypernatremia - fluid infusion - as per primary team   Hospital day # 3  Neurology will sign off for now. Please do not hesitate to call with questions. Thanks much for the consult.  Rosalin Hawking, MD PhD Stroke Neurology 01/17/2014 4:12 PM    To contact Stroke Continuity provider, please refer to [email or web address] After hours, contact General Neurology

## 2014-01-18 DIAGNOSIS — R1310 Dysphagia, unspecified: Secondary | ICD-10-CM

## 2014-01-18 DIAGNOSIS — G9340 Encephalopathy, unspecified: Secondary | ICD-10-CM

## 2014-01-18 DIAGNOSIS — R531 Weakness: Secondary | ICD-10-CM

## 2014-01-18 DIAGNOSIS — Z515 Encounter for palliative care: Secondary | ICD-10-CM

## 2014-01-18 DIAGNOSIS — R5383 Other fatigue: Secondary | ICD-10-CM

## 2014-01-18 DIAGNOSIS — R5381 Other malaise: Secondary | ICD-10-CM

## 2014-01-18 LAB — BASIC METABOLIC PANEL
Anion gap: 15 (ref 5–15)
BUN: 12 mg/dL (ref 6–23)
CO2: 24 meq/L (ref 19–32)
Calcium: 8.9 mg/dL (ref 8.4–10.5)
Chloride: 109 mEq/L (ref 96–112)
Creatinine, Ser: 0.4 mg/dL — ABNORMAL LOW (ref 0.50–1.10)
GFR calc Af Amer: >90 mL/min (ref >90)
GFR calc non Af Amer: >90 mL/min (ref >90)
Glucose, Bld: 92 mg/dL (ref 70–99)
POTASSIUM: 3.2 meq/L — AB (ref 3.7–5.3)
SODIUM: 148 meq/L — AB (ref 137–147)

## 2014-01-18 LAB — CBC
HCT: 34 % — ABNORMAL LOW (ref 36.0–46.0)
HEMOGLOBIN: 11 g/dL — AB (ref 12.0–15.0)
MCH: 27 pg (ref 26.0–34.0)
MCHC: 32.4 g/dL (ref 30.0–36.0)
MCV: 83.5 fL (ref 78.0–100.0)
Platelets: 669 10*3/uL — ABNORMAL HIGH (ref 150–400)
RBC: 4.07 MIL/uL (ref 3.87–5.11)
RDW: 17.3 % — ABNORMAL HIGH (ref 11.5–15.5)
WBC: 9.2 10*3/uL (ref 4.0–10.5)

## 2014-01-18 LAB — PROTIME-INR
INR: 1.17 (ref 0.00–1.49)
Prothrombin Time: 14.9 seconds (ref 11.6–15.2)

## 2014-01-18 MED ORDER — POTASSIUM CHLORIDE 10 MEQ/100ML IV SOLN
10.0000 meq | INTRAVENOUS | Status: AC
  Administered 2014-01-18 (×3): 10 meq via INTRAVENOUS
  Filled 2014-01-18 (×3): qty 100

## 2014-01-18 MED ORDER — WARFARIN - PHARMACIST DOSING INPATIENT
Freq: Every day | Status: DC
  Administered 2014-01-18: 18:00:00

## 2014-01-18 MED ORDER — WARFARIN SODIUM 5 MG PO TABS
5.0000 mg | ORAL_TABLET | Freq: Once | ORAL | Status: AC
  Administered 2014-01-18: 5 mg via ORAL
  Filled 2014-01-18: qty 1

## 2014-01-18 NOTE — Progress Notes (Signed)
[facility]                                                                              Patient Demographics  Cristina Patton, is a 80 y.o. female, DOB - October 27, 1926, DJM:[id number]  Admit date - 01/14/2014   Admitting Physician Robbie Lis, MD  Outpatient Primary MD for the patient is PROVIDER NOT [place]  LOS - 4   Chief Complaint  Patient presents with  . Altered Mental Status      HPI on 01/14/2014 80 y.o. 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 Please note that patient was obtunded at the time of the arrival to the [facility] and was unable to provide any history, most of the details were obtained from ED doctor and available records from SNF. Per record review, patient noted to be progressively more lethargic since earlier in the day, no reported fevers, chills, no noted dyspnea, no abd concerns reported.   In ED, patient noted to be lethargic, unable to follow any commands and unable to answer questions. CT head notable for asymmetric focal subcortical area of white matter hypoattenuation in the high left frontal lobe, concerning for an acute nonhemorrhagic infarct. MRI requested and currently pending. VSS with low grade fever 99.3 F. Blood work notable for mild leukocytosis 13.5, K 3.4. Neurology consulted by ED doctor and [place] asked to admit for further evaluation.    Assessment & Plan   Acute Encephalopathy secondary to Acute CVA -CT head: Asymmetric focal subcortical area of white matter hypoattenuation  in the high left frontal lobe is concerning for an acute  nonhemorrhagic infarct -MRI head: New left centrum semiovale nonhemorrhagic infarcts -Neurology consulted and appreciated, pending further recommendations -LDL 65, hemoglobin A1c 5.7. -Echocardiogram 12/19/2013: EF 55-60% -Carotid doppler 12/20/2013: 1-39%  ICA stenosis bilaterally. Antegrade vertebral flow. -Dr Erlinda Hong discussed with nephew Barnabas Lister, plan to start coumadin per pharmacy to dose. Discontinue aspirin when INR at goal.  -As an outpatient was Aspirin 325mg  and plavix 75mg  daily -Started on Dysphagia 1 diet 9-15. Started on coumadin, per neuro recommendation.   Hypernatremia; patient not drinking enough, not eating well, she hold food in her mouth. Continue with  half NS, would not start D 5 IV fluid due to recent stroke. Improving.   Sinus Tachycardia: will resume home dose metoprolol. Improved.   Hypokalemia: replete IV.   Severe protein calorie malnutrition -From underlying CVA and dementia -Last speech recommendation from prior hospitalization recommended dysphagia 1 diet -continue aspiration precautions -Dysphagia 1 diet.   Hypertension -Continue Norvasc and metoprolol.  -hydralazine when necessary  Leukocytosis with low-grade fever -Resolved -UA and chest x-ray show no infectious etiology -Continue to monitor  Functional quadriplegia -Complicated by failure to thrive and deconditioning -PT and OT consulted  Thrombocytosis, chronic. On aspirin.   Code Status: DNR.   Family Communication: nephew Mena Goes.   Disposition Plan: SNF when hypernatremia improved.   Time Spent in minutes   30 minutes  Procedures  None  Consults   Neurology  DVT Prophylaxis  Lovenox  Lab  Results  Component Value Date   PLT 669* 01/18/2014    Medications  Scheduled Meds: .  stroke: mapping our early stages of recovery book   Does not apply Once  . amLODipine  10 mg Oral q morning - 10a  . aspirin EC  81 mg Oral Daily  . enoxaparin (LOVENOX) injection  40 mg Subcutaneous QHS  . feeding supplement (ENSURE)  1 Container Oral TID BM  . metoprolol tartrate  50 mg Oral BID  . sodium chloride  3 mL Intravenous Q12H  . warfarin  5 mg Oral ONCE-1800  . warfarin   Does not apply Once  . Warfarin - Pharmacist Dosing Inpatient    Does not apply q1800   Continuous Infusions: . sodium chloride 100 mL/hr at 01/18/14 0651   PRN Meds:.sodium chloride, hydrALAZINE, sodium chloride  Antibiotics    Anti-infectives   None      Subjective:  More alert today. No complaints.    Objective:   Filed Vitals:   01/18/14 0108 01/18/14 0601 01/18/14 1005 01/18/14 1354  BP: 161/71 171/75 170/85 156/65  Pulse: 106 107 95 87  Temp: 98.6 F (37 C) 99.3 F (37.4 C) 98.2 F (36.8 C) 98.8 F (37.1 C)  TempSrc: Axillary Oral Oral Oral  Resp: 20 20 20 20   Height:   5' 7.2" (1.707 m)   Weight:   52.2 kg (115 lb 1.3 oz)   SpO2: 97% 98% 98% 99%    Wt Readings from Last 3 Encounters:  01/18/14 52.2 kg (115 lb 1.3 oz)  12/17/13 52.2 kg (115 lb 1.3 oz)  09/01/13 52.164 kg (115 lb)     Intake/Output Summary (Last 24 hours) at 01/18/14 1642 Last data filed at 01/18/14 1300  Gross per 24 hour  Intake    180 ml  Output      0 ml  Net    180 ml    Exam  General: NAD, appears stated age  HEENT: NCAT, PERRLA, Anicteic Sclera,   Neck: Supple, no JVD, no masses  Cardiovascular: S1 S2 auscultated, no rubs, murmurs or gallops. Regular rate and rhythm.  Respiratory: Clear to auscultation bilaterally with equal chest rise  Abdomen: Soft, nontender, nondistended, + bowel sounds  Extremities: warm dry without cyanosis clubbing or edema  Neuro: AAOx3, not oriented to time.  Cannot follow commands consistently. Has mild left facial droop. Strength testing unable to complete.  Data Review   Micro Results Recent Results (from the past 240 hour(s))  CULTURE, BLOOD (ROUTINE X 2)     Status: None   Collection Time    01/14/14  3:36 PM      Result Value Ref Range Status   Specimen Description BLOOD ARM RIGHT   Final   Special Requests BOTTLES DRAWN AEROBIC AND ANAEROBIC 5CC   Final   Culture  Setup Time     Final   Value: 01/14/2014 20:22     Performed at [facility]   Culture     Final   Value:        BLOOD  CULTURE RECEIVED NO GROWTH TO DATE CULTURE WILL BE HELD FOR 5 DAYS BEFORE ISSUING A FINAL NEGATIVE REPORT     Performed at [facility]   Report Status PENDING   Incomplete  CULTURE, BLOOD (ROUTINE X 2)     Status: None   Collection Time    01/14/14  3:49 PM      Result Value Ref Range Status  Specimen Description BLOOD LEFT FOREARM   Final   Special Requests BOTTLES DRAWN AEROBIC AND ANAEROBIC 5CC   Final   Culture  Setup Time     Final   Value: 01/14/2014 20:22     Performed at [facility]   Culture     Final   Value:        BLOOD CULTURE RECEIVED NO GROWTH TO DATE CULTURE WILL BE HELD FOR 5 DAYS BEFORE ISSUING A FINAL NEGATIVE REPORT     Performed at [facility]   Report Status PENDING   Incomplete    Radiology Reports Dg Chest 2 View  12/17/2013   CLINICAL DATA:  Altered mental status, history stroke, hypertension  EXAM: CHEST  2 VIEW  COMPARISON:  04/02/2013  FINDINGS: Upper normal heart size.  Normal mediastinal contours and pulmonary vascularity.  Rotation to the LEFT.  Mild bibasilar atelectasis.  LEFT upper lobe infiltrate present.  Remaining lungs clear.  No pleural effusion or pneumothorax.  Bones demineralized.  Atherosclerotic calcification aortic arch.  IMPRESSION: LEFT upper lobe infiltrate and mild bibasilar atelectasis.   Electronically Signed   By: Lavonia Dana M.D.   On: 12/17/2013 11:20   Ct Head Wo Contrast  01/14/2014   CLINICAL DATA:  Altered mental status. Decreased level of consciousness. Recent infarcts.  EXAM: CT HEAD WITHOUT CONTRAST  TECHNIQUE: Contiguous axial images were obtained from the base of the skull through the vertex without intravenous contrast.  COMPARISON:  MRI brain 12/17/2013  FINDINGS: Asymmetric subcortical white matter hypoattenuation in the high left frontal lobe on images 20 and 21 of series 201 is concerning for an acute nonhemorrhagic infarct. Mild generalized atrophy and white matter disease is otherwise stable.  There is no hemorrhage or mass. Remote infarcts of the basal ganglia are noted bilaterally.  The ventricles are proportionate to the degree of atrophy. No significant extra-axial fluid collection is present. Atherosclerotic changes are again noted within the cavernous carotid arteries.  IMPRESSION: 1. Asymmetric focal subcortical area of white matter hypoattenuation in the high left frontal lobe is concerning for an acute nonhemorrhagic infarct. MRI could be used for confirmation. 2. Otherwise stable atrophy and white matter disease. 3. Atherosclerosis.   Electronically Signed   By: Lawrence Santiago M.D.   On: 01/14/2014 16:15   Ct Head Wo Contrast  12/17/2013   CLINICAL DATA:  Subjective decrease in overall activity, with increasing LEFT-sided weakness. History of prior stroke. History of hypertension.  EXAM: CT HEAD WITHOUT CONTRAST  TECHNIQUE: Contiguous axial images were obtained from the base of the skull through the vertex without contrast.  COMPARISON:  MR head 03/22/2013.  CT head 03/20/2013.  FINDINGS: No evidence for acute infarction, hemorrhage, mass lesion, hydrocephalus, or extra-axial fluid. Generalized cerebral and cerebellar atrophy. Chronic microvascular ischemic change affects the periventricular and subcortical white matter. There is a remote RIGHT cerebellar infarct which is difficult to visualize. The calvarium is intact. Vascular calcification. There is no acute sinus disease. Trace RIGHT mastoid effusion. When technique differences are considered, similar appearance to priors.  IMPRESSION: No acute findings.  Age-related changes as described.   Electronically Signed   By: Rolla Flatten M.D.   On: 12/17/2013 11:21   Mr Jodene Nam Head Wo Contrast  01/14/2014   CLINICAL DATA:  80 year old female with decreased level of consciousness. High blood pressure.  EXAM: MRI HEAD WITHOUT CONTRAST  MRA HEAD WITHOUT CONTRAST  TECHNIQUE: Multiplanar, multiecho pulse sequences of the brain and surrounding  structures were  obtained without intravenous contrast. Angiographic images of the head were obtained using MRA technique without contrast.  COMPARISON:  01/14/2014 CT.  12/17/2013 MR.  FINDINGS: MRI HEAD FINDINGS  New left centrum semiovale nonhemorrhagic infarcts.  Subacute right centrum semiovale infarcts.  Remote left occipital lobe infarct.  Prominent small vessel disease type changes.  No intracranial hemorrhage.  No intracranial mass lesion noted on this unenhanced exam.  Global atrophy without hydrocephalus.  Partially empty non expanded sella incidentally noted.  Partial opacification right mastoid air cells without obstructing mass seen causing eustachian tube dysfunction.  MRA HEAD FINDINGS  Fusiform ectasia pre cavernous segment right internal carotid artery. Mild narrowing and irregularity  Ectatic left internal carotid artery distal vertical cervical segment.  Mild ectasia and slight irregularity with minimal narrowing involving portions of the cavernous segment of the internal carotid artery bilaterally.  Mild narrowing supraclinoid segment left internal carotid artery.  Moderate to marked tandem stenosis A2 segment anterior cerebral artery greater on the right.  Moderate narrowing proximal M2 segment and marked narrowing M3 segment right middle cerebral artery branches. Mild to moderate narrowing left middle cerebral artery branches.  Left vertebral artery ends in a posterior inferior cerebellar artery distribution.  Ectatic right vertebral artery without significant narrowing.  Nonvisualized right posterior inferior cerebellar artery.  Mild to moderate tandem stenosis basilar artery.  Nonvisualized left anterior inferior cerebellar artery.  Moderate to marked narrowing proximal left superior cerebellar artery.  Moderate to marked tandem stenosis portions of the posterior cerebral artery bilaterally.  No aneurysm visualized.  IMPRESSION: MRI HEAD:  New left centrum semiovale nonhemorrhagic infarcts.   Subacute right centrum semiovale infarcts.  Remote left occipital lobe infarct.  Prominent small vessel disease type changes.  No intracranial hemorrhage.  Partial opacification right mastoid air cells without obstructing mass seen causing eustachian tube dysfunction.  MRA HEAD FINDINGS  Intracranial atherosclerotic type changes most notable involving medium size vessels and branch vessels but also involving the basilar artery and supraclinoid segment left internal internal carotid artery as detailed above.   Electronically Signed   By: Chauncey Cruel M.D.   On: 01/14/2014 20:57   [name] ZO Contrast  12/17/2013   ADDENDUM REPORT: 12/17/2013 15:25  ADDENDUM: Study discussed by telephone with Dr. Carmin Muskrat on 12/17/2013 at 15:20 hours.   Electronically Signed   By: Lars Pinks M.D.   On: 12/17/2013 15:25   12/17/2013   CLINICAL DATA:  80 year old female with increasing weakness and confusion. Initial encounter. History of small right inferior cerebellar infarct in 2014.  EXAM: MRI HEAD WITHOUT CONTRAST  MRA HEAD WITHOUT CONTRAST  TECHNIQUE: Multiplanar, multiecho pulse sequences of the brain and surrounding structures were obtained without intravenous contrast. Angiographic images of the head were obtained using MRA technique without contrast.  COMPARISON:  Head CT without contrast 1050 hr today. Brain MRI and MRA 03/22/2013.  FINDINGS: MRI HEAD FINDINGS  Patchy clustered cortical and white matter restricted diffusion in the right superior frontal gyrus, mostly the pre motor area (see series 5, images 23-28).  No left hemisphere or posterior fossa restricted diffusion identified.  Major intracranial vascular flow voids are stable.  Mild if any associated T2 and FLAIR hyperintensity to correspond with the acute diffusion abnormality. Underlying Patchy and confluent white matter T2 and FLAIR signal changes are stable. No acute intracranial hemorrhage identified. No midline shift, mass effect, or evidence of  intracranial mass lesion. No ventriculomegaly. Stable pituitary gland, cervicomedullary junction, and visualized cervical spine. Normal bone marrow  signal.  New mild right mastoid effusion. Rightward gaze deviation. Stable paranasal sinuses. Visualized scalp soft tissues are within normal limits.  MRA HEAD FINDINGS  Stable antegrade flow in the posterior circulation with dominant distal right vertebral artery. Widespread basilar artery irregularity does appear progressed along with mild multifocal basilar artery stenosis. The moderate stenosis just proximal to the SCA origins appears stable.  Widespread bilateral PCA irregularity with high-grade distal P2 stenoses appears stable. Posterior communicating arteries are diminutive or absent.  Stable antegrade flow in the left ICA siphon. No left ICA stenosis identified. Left carotid terminus MCA, and left ACA origins remain within normal limits. Increased irregularity of the visualized left ACA branches, but with Stable distal left ACA flow. Moderate irregularity of the left MCA branches is stable, with stable distal left MCA flow.  Stable antegrade flow in the right ICA siphon but increased irregularity in the vertical P each wrists segment now resulting in mild stenosis. Stable right ICA terminus infundibulum. Patent right ICA terminus with stable right MCA and ACA origins.  New high-grade irregularity and attenuation of flow signal in the low right ACA distal A2 segment, with poor distal right ACA flow signal.  Proximal right M2 branch irregularity and stenosis appears not significantly changed since 2014. Right MCA branch flow signal appears stable, with no interval major right MCA branch occlusion identified.  IMPRESSION: 1. Patchy acute infarcts in the right ACA / MCA watershed territory involving the superior frontal gyrus, mostly in the pre motor area. No mass effect or hemorrhage. 2. New distal right ACA occlusion and high-grade irregularity of the A2 segment,  concordant with #1. Right MCA M2 branch atherosclerosis and flow appears stable since 2014. 3. Interval mild progression of right ICA siphon and basilar artery atherosclerosis, but no other new or progressed hemodynamically significant stenosis compared to the 2014 MRA.  Electronically Signed: By: Lars Pinks M.D. On: 12/17/2013 15:18   Mr Brain Wo Contrast  01/14/2014   CLINICAL DATA:  80 year old female with decreased level of consciousness. High blood pressure.  EXAM: MRI HEAD WITHOUT CONTRAST  MRA HEAD WITHOUT CONTRAST  TECHNIQUE: Multiplanar, multiecho pulse sequences of the brain and surrounding structures were obtained without intravenous contrast. Angiographic images of the head were obtained using MRA technique without contrast.  COMPARISON:  01/14/2014 CT.  12/17/2013 MR.  FINDINGS: MRI HEAD FINDINGS  New left centrum semiovale nonhemorrhagic infarcts.  Subacute right centrum semiovale infarcts.  Remote left occipital lobe infarct.  Prominent small vessel disease type changes.  No intracranial hemorrhage.  No intracranial mass lesion noted on this unenhanced exam.  Global atrophy without hydrocephalus.  Partially empty non expanded sella incidentally noted.  Partial opacification right mastoid air cells without obstructing mass seen causing eustachian tube dysfunction.  MRA HEAD FINDINGS  Fusiform ectasia pre cavernous segment right internal carotid artery. Mild narrowing and irregularity  Ectatic left internal carotid artery distal vertical cervical segment.  Mild ectasia and slight irregularity with minimal narrowing involving portions of the cavernous segment of the internal carotid artery bilaterally.  Mild narrowing supraclinoid segment left internal carotid artery.  Moderate to marked tandem stenosis A2 segment anterior cerebral artery greater on the right.  Moderate narrowing proximal M2 segment and marked narrowing M3 segment right middle cerebral artery branches. Mild to moderate narrowing left  middle cerebral artery branches.  Left vertebral artery ends in a posterior inferior cerebellar artery distribution.  Ectatic right vertebral artery without significant narrowing.  Nonvisualized right posterior inferior cerebellar artery.  Mild  to moderate tandem stenosis basilar artery.  Nonvisualized left anterior inferior cerebellar artery.  Moderate to marked narrowing proximal left superior cerebellar artery.  Moderate to marked tandem stenosis portions of the posterior cerebral artery bilaterally.  No aneurysm visualized.  IMPRESSION: MRI HEAD:  New left centrum semiovale nonhemorrhagic infarcts.  Subacute right centrum semiovale infarcts.  Remote left occipital lobe infarct.  Prominent small vessel disease type changes.  No intracranial hemorrhage.  Partial opacification right mastoid air cells without obstructing mass seen causing eustachian tube dysfunction.  MRA HEAD FINDINGS  Intracranial atherosclerotic type changes most notable involving medium size vessels and branch vessels but also involving the basilar artery and supraclinoid segment left internal internal carotid artery as detailed above.   Electronically Signed   By: Chauncey Cruel M.D.   On: 01/14/2014 20:57   Mr Brain Wo Contrast  12/17/2013   ADDENDUM REPORT: 12/17/2013 15:25  ADDENDUM: Study discussed by telephone with Dr. Carmin Muskrat on 12/17/2013 at 15:20 hours.   Electronically Signed   By: Lars Pinks M.D.   On: 12/17/2013 15:25   12/17/2013   CLINICAL DATA:  80 year old female with increasing weakness and confusion. Initial encounter. History of small right inferior cerebellar infarct in 2014.  EXAM: MRI HEAD WITHOUT CONTRAST  MRA HEAD WITHOUT CONTRAST  TECHNIQUE: Multiplanar, multiecho pulse sequences of the brain and surrounding structures were obtained without intravenous contrast. Angiographic images of the head were obtained using MRA technique without contrast.  COMPARISON:  Head CT without contrast 1050 hr today. Brain MRI and  MRA 03/22/2013.  FINDINGS: MRI HEAD FINDINGS  Patchy clustered cortical and white matter restricted diffusion in the right superior frontal gyrus, mostly the pre motor area (see series 5, images 23-28).  No left hemisphere or posterior fossa restricted diffusion identified.  Major intracranial vascular flow voids are stable.  Mild if any associated T2 and FLAIR hyperintensity to correspond with the acute diffusion abnormality. Underlying Patchy and confluent white matter T2 and FLAIR signal changes are stable. No acute intracranial hemorrhage identified. No midline shift, mass effect, or evidence of intracranial mass lesion. No ventriculomegaly. Stable pituitary gland, cervicomedullary junction, and visualized cervical spine. Normal bone marrow signal.  New mild right mastoid effusion. Rightward gaze deviation. Stable paranasal sinuses. Visualized scalp soft tissues are within normal limits.  MRA HEAD FINDINGS  Stable antegrade flow in the posterior circulation with dominant distal right vertebral artery. Widespread basilar artery irregularity does appear progressed along with mild multifocal basilar artery stenosis. The moderate stenosis just proximal to the SCA origins appears stable.  Widespread bilateral PCA irregularity with high-grade distal P2 stenoses appears stable. Posterior communicating arteries are diminutive or absent.  Stable antegrade flow in the left ICA siphon. No left ICA stenosis identified. Left carotid terminus MCA, and left ACA origins remain within normal limits. Increased irregularity of the visualized left ACA branches, but with Stable distal left ACA flow. Moderate irregularity of the left MCA branches is stable, with stable distal left MCA flow.  Stable antegrade flow in the right ICA siphon but increased irregularity in the vertical P each wrists segment now resulting in mild stenosis. Stable right ICA terminus infundibulum. Patent right ICA terminus with stable right MCA and ACA  origins.  New high-grade irregularity and attenuation of flow signal in the low right ACA distal A2 segment, with poor distal right ACA flow signal.  Proximal right M2 branch irregularity and stenosis appears not significantly changed since 2014. Right MCA branch flow signal appears stable, with  no interval major right MCA branch occlusion identified.  IMPRESSION: 1. Patchy acute infarcts in the right ACA / MCA watershed territory involving the superior frontal gyrus, mostly in the pre motor area. No mass effect or hemorrhage. 2. New distal right ACA occlusion and high-grade irregularity of the A2 segment, concordant with #1. Right MCA M2 branch atherosclerosis and flow appears stable since 2014. 3. Interval mild progression of right ICA siphon and basilar artery atherosclerosis, but no other new or progressed hemodynamically significant stenosis compared to the 2014 MRA.  Electronically Signed: By: Lars Pinks M.D. On: 12/17/2013 15:18   Dg Chest Port 1 View  01/14/2014   CLINICAL DATA:  Altered mental status.  EXAM: PORTABLE CHEST - 1 VIEW  COMPARISON:  12/17/2013  FINDINGS: Patient is rotated to the right. Lungs are adequately inflated without focal consolidation or effusion. Cardiomediastinal silhouette and remainder the exam is unchanged.  IMPRESSION: No active disease.   Electronically Signed   By: Marin Olp M.D.   On: 01/14/2014 16:34    CBC  Recent Labs Lab 01/14/14 1511 01/14/14 1611 01/15/14 0544 01/16/14 0628 01/18/14 0721  WBC 13.5*  --  9.9 8.2 9.2  HGB 14.3 16.0* 12.3 11.7* 11.0*  HCT 43.0 47.0* 37.0 35.5* 34.0*  PLT 747*  --  720* 776* 669*  MCV 82.4  --  81.0 80.9 83.5  MCH 27.4  --  26.9 26.7 27.0  MCHC 33.3  --  33.2 33.0 32.4  RDW 17.2*  --  17.2* 17.2* 17.3*  LYMPHSABS 1.4  --   --   --   --   MONOABS 1.9*  --   --   --   --   EOSABS 0.0  --   --   --   --   BASOSABS 0.0  --   --   --   --     Chemistries   Recent Labs Lab 01/14/14 1511  01/15/14 0544  01/16/14 0628 01/17/14 0938 01/17/14 1820 01/18/14 0721  NA 146  < > 149* 150* 155* 154* 148*  K 3.7  < > 3.9 3.0* 3.4* 3.9 3.2*  CL 104  < > 111 110 114* 115* 109  CO2 23  --  22 24 25 26 24   GLUCOSE 126*  < > 112* 94 123* 111* 92  BUN 19  < > 16 14 15 14 12   CREATININE 0.49*  < > 0.42* 0.45* 0.47* 0.42* 0.40*  CALCIUM 10.4  --  9.3 9.4 9.3 8.9 8.9  AST 22  --   --   --   --   --   --   ALT 8  --   --   --   --   --   --   ALKPHOS 75  --   --   --   --   --   --   BILITOT 1.0  --   --   --   --   --   --   < > = values in this interval not displayed. ------------------------------------------------------------------------------------------------------------------ estimated creatinine clearance is 42.4 ml/min (by C-G formula based on Cr of 0.4). ------------------------------------------------------------------------------------------------------------------ No results found for this basename: HGBA1C,  in the last 72 hours ------------------------------------------------------------------------------------------------------------------ No results found for this basename: CHOL, HDL, LDLCALC, TRIG, CHOLHDL, LDLDIRECT,  in the last 72 hours ------------------------------------------------------------------------------------------------------------------ No results found for this basename: TSH, T4TOTAL, FREET3, T3FREE, THYROIDAB,  in the last 72 hours ------------------------------------------------------------------------------------------------------------------ No results found for this basename:  VITAMINB12, FOLATE, FERRITIN, TIBC, IRON, RETICCTPCT,  in the last 72 hours  Coagulation profile  Recent Labs Lab 01/14/14 1511 01/18/14 0721  INR 1.08 1.17    No results found for this basename: DDIMER,  in the last 72 hours  Cardiac Enzymes No results found for this basename: CK, CKMB, TROPONINI, MYOGLOBIN,  in the last 168  hours ------------------------------------------------------------------------------------------------------------------ No components found with this basename: POCBNP,     Jesua Tamblyn A D.O. on 01/18/2014 at 4:42 PM [phone] Between 7am to 7pm - Pager - [phone]  After 7pm go to [email or web address] - password [email or web address]  And look for the night coverage person covering for me after hours  [facility] Office  [phone]

## 2014-01-18 NOTE — Consult Note (Signed)
Patient AC:Cristina Patton      DOB: 1926-06-28      KZS:[id number]     Consult Note from the Palliative Medicine Team at [place] Requested by: Dr. Tyrell Antonio     PCP: PROVIDER NOT [place] Reason for Consultation: GOC/options.     Phone Number:None  Assessment of patients Current state: I met today with Cristina Patton and her nephew, Mena Goes, at bedside. Mr. Eulas Post has been present and helps assist Cristina Patton with her needs and decisions. He says he has siblings that help with this as well and he discusses with them. Mr. 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 She was also an avid Firefighter. He tells me that she was consistently declined any aggressive care such as operations, feeding tube, ACLS and has always been very practical with her decisions for her life. He says that he continues to include her in these conversations and helps her make her own decisions. She is very alert and awake this morning telling me "good morning," waving and answering my questions if they are simply answered yes/no. We discussed the most worrisome issue will likely be her nutrition/hydration status. I am very concerned that she will not be able to maintain adequate hydration and nutrition after this stroke and worsening swallow function. Mr. Eulas Post agrees this should be followed closely at SNF and he agrees to the assistance of palliative care to follow at discharge to help with decisions regarding rehospitalization/etc. He seems very practical in recognizing medical limitations and weighting risk/benefit of the decisions they may be faced with but he continues to want these to be her decisions. I have provided him with Hard Choices booklet and MOST form and explained MOST. He is not prepared to complete MOST form but will further discuss with [name] and palliative  may assist with this at SNF as well.    Goals of Care: 1.  Code Status: DNR   2. Scope of Treatment: Focus is on comfort. DNR confirmed. No feeding tube desired. They are considering rehospitalization and wish to be able to discuss this decision with [name].    4. Disposition: SNF with palliative.    3. Symptom Management:   1. Dysphagia: SLP following. Encourage intake when awake/alert and must be patient with her to eat. Palliative to follow intake and tolerance and amount to help with decisions at SNF.  2. Weakness: PT/OT to follow at SNF. Continue medical management.  3. Bowel Regimen: Recommend dulcolax supp x 1 (LBM unknown - prior to admission) and then daily prn.   4. Psychosocial: Emotional support provided to patient and family.     Patient Documents Completed or Given: Document Given Completed  Advanced Directives Pkt    MOST yes   DNR    Gone from My Sight    Hard Choices yes     Brief HPI: 80 yo female admitted with new stroke. PMH significant for recent stroke, residual left hemiparesis since Nov 2014, worsening, dementia, HTN, functional quadriplegia. She is on dysphagia I thin liquid diet but is having increasing difficulty swallowing and concern about adequate nutrition and hydration.    ROS: Denies pain, anxiety, sleep disturbance.     PMH:  Past Medical History  Diagnosis Date  . Hypertension   . CVA (cerebral infarction)   . Stroke  JFH:LKTGYBW reviewed. No pertinent past surgical history. I have reviewed the [place] and SH and  If appropriate update it with new information. No Known Allergies Scheduled Meds: .  stroke: mapping our early stages of recovery book   Does not apply Once  . amLODipine  10 mg Oral q morning - 10a  . aspirin EC  81 mg Oral Daily  . coumadin book   Does not apply Once  . enoxaparin (LOVENOX) injection  40 mg Subcutaneous QHS  . feeding supplement (ENSURE)  1 Container Oral TID BM  . metoprolol tartrate  50  mg Oral BID  . sodium chloride  3 mL Intravenous Q12H  . warfarin  5 mg Oral ONCE-1800  . warfarin   Does not apply Once   Continuous Infusions: . sodium chloride 100 mL/hr at 01/18/14 0651   PRN Meds:.sodium chloride, hydrALAZINE, sodium chloride    BP 170/85  Pulse 95  Temp(Src) 98.2 F (36.8 C) (Oral)  Resp 20  Ht 5' 7.2" (1.707 m)  Wt 52.2 kg (115 lb 1.3 oz)  BMI 17.91 kg/m2  SpO2 98%   PPS: 20%   Intake/Output Summary (Last 24 hours) at 01/18/14 1115 Last data filed at 01/18/14 1100  Gross per 24 hour  Intake    360 ml  Output      0 ml  Net    360 ml   LBM: Unknown  Physical Exam:  General: NAD, fetal position, pleasant HEENT: [place]/AT, no JVD, moist mucous membranes Chest:  CTA throughout, no labored breathing, symmetric CVS: RRR, S1 S2 Abdomen: Soft, NT, ND, +BS Ext: No edema, warm to touch Neuro: Awake, alert, answers questions appropriately, oriented to person/place not to time and somewhat to situation  Labs: CBC    Component Value Date/Time   WBC 9.2 01/18/2014 0721   RBC 4.07 01/18/2014 0721   HGB 11.0* 01/18/2014 0721   HCT 34.0* 01/18/2014 0721   PLT 669* 01/18/2014 0721   MCV 83.5 01/18/2014 0721   MCH 27.0 01/18/2014 0721   MCHC 32.4 01/18/2014 0721   RDW 17.3* 01/18/2014 0721   LYMPHSABS 1.4 01/14/2014 1511   MONOABS 1.9* 01/14/2014 1511   EOSABS 0.0 01/14/2014 1511   BASOSABS 0.0 01/14/2014 1511    BMET    Component Value Date/Time   NA 148* 01/18/2014 0721   K 3.2* 01/18/2014 0721   CL 109 01/18/2014 0721   CO2 24 01/18/2014 0721   GLUCOSE 92 01/18/2014 0721   BUN 12 01/18/2014 0721   CREATININE 0.40* 01/18/2014 0721   CALCIUM 8.9 01/18/2014 0721   GFRNONAA >90 01/18/2014 0721   GFRAA >90 01/18/2014 0721    CMP     Component Value Date/Time   NA 148* 01/18/2014 0721   K 3.2* 01/18/2014 0721   CL 109 01/18/2014 0721   CO2 24 01/18/2014 0721   GLUCOSE 92 01/18/2014 0721   BUN 12 01/18/2014 0721   CREATININE 0.40* 01/18/2014 0721   CALCIUM 8.9  01/18/2014 0721   PROT 8.5* 01/14/2014 1511   ALBUMIN 3.5 01/14/2014 1511   AST 22 01/14/2014 1511   ALT 8 01/14/2014 1511   ALKPHOS 75 01/14/2014 1511   BILITOT 1.0 01/14/2014 1511   GFRNONAA >90 01/18/2014 0721   GFRAA >90 01/18/2014 0721     Time In Time Out Total Time Spent with Patient Total Overall Time  1020 1110 53mn 538m    Greater than 50%  of this time was spent counseling and coordinating care related  to the above assessment and plan.  Vinie Sill, NP Palliative Medicine Team Pager # [phone] (M-F 8a-5p) Team Phone # [phone] (Nights/Weekends)

## 2014-01-18 NOTE — Progress Notes (Signed)
Patient Cristina Patton      DOB: 1926-06-03      KSH:[id number]  PMT consult received will schedule as soon as possible. Please do not hesitate to call if we can assist via phone with symptom management until we are able to complete full consult   Tima Curet L. Lovena Le, MD MBA The Palliative Medicine Team at [facility] Phone: [phone] Pager: [phone] ( Use team phone after hours)

## 2014-01-18 NOTE — Consult Note (Signed)
I have reviewed this case with our NP and agree with the Assessment and Plan as stated.  Seville Brick L. Alessia Gonsalez, MD MBA The Palliative Medicine Team at [place] Team Phone: [phone] Pager: [phone]   

## 2014-01-18 NOTE — Progress Notes (Signed)
Physical Therapy Treatment Patient Details Name: PHYLLISTINE DOMINGOS MRN: [id number] DOB: 08-04-1926 Today's Date: 01/18/2014    History of Present Illness [place] ALLAHNA HUSBAND is a 80 y.o. female admitted from [facility] with elevated BP and lethargic. Pt was total (A) for all adls and two person (A) for transfer to w/c. suspected PNA aspiration.  MRI (+) New left centrum semiovale nonhemorrhagic infarcts. Subacute right centrum semiovale infarcts. Remote left occipital lobe infarct. PMH: CVA ( rt cerebellar area) HTN.     PT Comments    Pt not very participatory in session today. No resistance noted but decr ability to follow commands. Worked on ROM of LEs and sitting EOB. If progress continues to lack, may want to consider communicating with family with regards to [place] and goals.   Follow Up Recommendations  SNF     Equipment Recommendations  None recommended by PT    Recommendations for Other Services       Precautions / Restrictions Precautions Precautions: Fall Precaution Comments: risk for subluxation of LT shoulder, weak all over, but L>R Restrictions Weight Bearing Restrictions: No    Mobility  Bed Mobility Overal bed mobility: +2 for physical assistance;Needs Assistance Bed Mobility: Supine to Sit;Sit to Supine     Supine to sit: Total assist;+2 for physical assistance Sit to supine: +2 for physical assistance;Total assist   General bed mobility comments: pt very stiff and kyphotic; requires 2 person  to bring upright to sitting position and then can hold herself in sitting position but with fatigue begins to fwd flex and shift anteriorly  Transfers                 General transfer comment: did not assess today due to lack of participation in sitting EOB ; can be OOB with nursing with use of lift  Ambulation/Gait                 Stairs            Wheelchair Mobility    Modified Rankin (Stroke Patients Only) Modified Rankin (Stroke  Patients Only) Pre-Morbid Rankin Score: Severe disability Modified Rankin: Severe disability     Balance Overall balance assessment: Needs assistance Sitting-balance support: Feet supported;Bilateral upper extremity supported Sitting balance-Leahy Scale: Poor Sitting balance - Comments: pt very kyphotic and rounded shoulders/fwd head; able to sit EOB with UE support but tends to lean very far anteriorly and unsafe; tolerated sitting EOB ~10 min to work on increasing arousal and work on sitting balance; pt not participatory in activities and very few 'yes"/ "no" responses  Postural control: Other (comment) (anteriorly/fwd flexed )                          Cognition Arousal/Alertness: Awake/alert Behavior During Therapy: Flat affect Overall Cognitive Status: No family/caregiver present to determine baseline cognitive functioning                 General Comments: difficult to assess due to no family present and very few verbalizations; responding 'yes" and "no" but inconsistent     Exercises Low Level/ICU Exercises Ankle Circles/Pumps: PROM;Both;10 reps;Supine Heel Slides: PROM;5 reps;10 reps;Supine Other Exercises Other Exercises: worked on hamstring and hip flexor stretching to limit contractures; pt grimace in obvious pain with range of Rt LE     General Comments General comments (skin integrity, edema, etc.): pt repositioned in bed with heels elevated; high risk for pressure sures due to incontinence  and decr mobility       Pertinent Vitals/Pain Pain Assessment: Faces Faces Pain Scale: Hurts even more Pain Location: with AROM of Rt LE  Pain Descriptors / Indicators:  (did not state) Pain Intervention(s): Repositioned;Monitored during session    Home Living                      Prior Function            PT Goals (current goals can now be found in the care plan section) Acute Rehab PT Goals Patient Stated Goal: none stated  PT Goal Formulation:  Patient unable to participate in goal setting Time For Goal Achievement: 01/29/14 Potential to Achieve Goals: Fair Progress towards PT goals: Not progressing toward goals - comment (decreased participation)    Frequency  Min 2X/week    PT Plan Current plan remains appropriate    Co-evaluation             End of Session   Activity Tolerance: Other (comment);Patient limited by fatigue (limited by arousal a) Patient left: in bed;with call bell/phone within reach;with bed alarm set     Time: 1405-1420 PT Time Calculation (min): 15 min  Charges:  $Therapeutic Activity: 8-22 mins                    G CodesGustavus Bryant , [place]  01/18/2014, 4:09 PM

## 2014-01-18 NOTE — Clinical Social Work Note (Signed)
Pt has bed available at [facility] once medically stable for discharge (including throughout weekend). CSW to continue to follow and assist with discharge as needed.  Lubertha Sayres, [place] [phone]) Licensed Clinical Social Worker Neuroscience [phone]) and Medical ICU (37M)

## 2014-01-18 NOTE — Progress Notes (Signed)
UR complete.  Sherby Moncayo RN, MSN 

## 2014-01-18 NOTE — Progress Notes (Signed)
ANTICOAGULATION CONSULT NOTE - Initial Consult  Pharmacy Consult for Warfarin Indication: stroke prevention  No Known Allergies  Patient Measurements: Height: 5' 7.2" (170.7 cm) Weight: 115 lb 1.3 oz (52.2 kg) IBW/kg (Calculated) : 62.06   Vital Signs: Temp: 98.2 F (36.8 C) (09/18 1005) Temp src: Oral (09/18 1005) BP: 170/85 mmHg (09/18 1005) Pulse Rate: 95 (09/18 1005)  Labs:  Recent Labs  01/16/14 0628 01/17/14 0938 01/17/14 1820 01/18/14 0721  HGB 11.7*  --   --  11.0*  HCT 35.5*  --   --  34.0*  PLT 776*  --   --  669*  LABPROT  --   --   --  14.9  INR  --   --   --  1.17  CREATININE 0.45* 0.47* 0.42* 0.40*    Estimated Creatinine Clearance: 42.4 ml/min (by C-G formula based on Cr of 0.4).   Medical History: Past Medical History  Diagnosis Date  . Hypertension   . CVA (cerebral infarction)   . Stroke     Medications:  Prescriptions prior to admission  Medication Sig Dispense Refill  . amLODipine (NORVASC) 10 MG tablet Take 10 mg by mouth every morning.      Marland Kitchen aspirin 325 MG EC tablet Take 325 mg by mouth every morning.      Marland Kitchen atorvastatin (LIPITOR) 20 MG tablet Take 1 tablet (20 mg total) by mouth daily at 6 PM.      . clopidogrel (PLAVIX) 75 MG tablet Take 1 tablet (75 mg total) by mouth daily.      . metoprolol tartrate (LOPRESSOR) 25 MG tablet Take 1 tablet (25 mg total) by mouth 2 (two) times daily.      . polyethylene glycol (MIRALAX / GLYCOLAX) packet Take 17 g by mouth daily.       Scheduled:  .  stroke: mapping our early stages of recovery book   Does not apply Once  . amLODipine  10 mg Oral q morning - 10a  . aspirin EC  81 mg Oral Daily  . coumadin book   Does not apply Once  . enoxaparin (LOVENOX) injection  40 mg Subcutaneous QHS  . feeding supplement (ENSURE)  1 Container Oral TID BM  . metoprolol tartrate  50 mg Oral BID  . potassium chloride  10 mEq Intravenous Q1 Hr x 3  . sodium chloride  3 mL Intravenous Q12H  . warfarin  5 mg  Oral ONCE-1800  . warfarin   Does not apply Once   Infusions:  . sodium chloride 100 mL/hr at 01/18/14 7741    Assessment: 80yo female with recent admission for aspiration PNA and CVA in August 2015 presents from SNF with AMS. Coumadin was started on 9/17 for stroke prevention. INR 1.17, dose wasn't given last night, d/t pt. refused any PO meds. H/H are slightly low, Plts elevated. Pt is also on prophylaxis dose lovenox.   Goal of Therapy:  INR 2-3 Monitor platelets by anticoagulation protocol: Yes   Plan:  -Start warfarin 5mg  PO tonight x1 -Daily INR/CBC  Maryanna Shape, PharmD, BCPS  Clinical Pharmacist  Pager: [phone]  01/18/2014,12:26 PM

## 2014-01-18 NOTE — Progress Notes (Signed)
Full note to follow:  I met today with Ms. Eberwein and her nephew, Mena Goes, at bedside. Mr. Eulas Post has been present and helps assist Ms. Gassner with her needs and decisions. He says he has siblings that help with this as well and he discusses with them. Mr. Eulas Post is a very intelligent man who tells me about Ms. 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 He tells me that she was consistently declined any aggressive care such as operations, feeding tube, ACLS and has always been very practical with her decisions for her life. He says that he continues to include her in these conversations and helps her make her own decisions. She is very alert and awake this morning telling me "good morning," waving and answering my questions if they are simply answered yes/no. We discussed the most worrisome issue will likely be her nutrition/hydration status. I am very concerned that she will not be able to maintain adequate hydration and nutrition after this stroke and worsening swallow function. Mr. Eulas Post agrees this should be followed closely at SNF and he agrees to the assistance of palliative care to follow at discharge to help with decisions regarding rehospitalization/etc. He seems very practical in recognizing medical limitations and weighting risk/benefit of the decisions they may be faced with but he continues to want these to be her decisions. I have provided him with Hard Choices booklet and MOST form and explained MOST. He is not prepared to complete MOST form but will further discuss with [name] and palliative may assist with this at SNF as well.  Vinie Sill, NP Palliative Medicine Team Pager # [phone] (M-F 8a-5p) Team Phone # [phone] (Nights/Weekends)

## 2014-01-19 LAB — CBC
HCT: 36.4 % (ref 36.0–46.0)
Hemoglobin: 11.9 g/dL — ABNORMAL LOW (ref 12.0–15.0)
MCH: 26.4 pg (ref 26.0–34.0)
MCHC: 32.7 g/dL (ref 30.0–36.0)
MCV: 80.9 fL (ref 78.0–100.0)
PLATELETS: 765 10*3/uL — AB (ref 150–400)
RBC: 4.5 MIL/uL (ref 3.87–5.11)
RDW: 16.8 % — ABNORMAL HIGH (ref 11.5–15.5)
WBC: 12.3 10*3/uL — ABNORMAL HIGH (ref 4.0–10.5)

## 2014-01-19 LAB — BASIC METABOLIC PANEL
Anion gap: 19 — ABNORMAL HIGH (ref 5–15)
BUN: 9 mg/dL (ref 6–23)
CO2: 21 meq/L (ref 19–32)
Calcium: 9.2 mg/dL (ref 8.4–10.5)
Chloride: 104 mEq/L (ref 96–112)
Creatinine, Ser: 0.38 mg/dL — ABNORMAL LOW (ref 0.50–1.10)
GFR calc Af Amer: >90 mL/min (ref >90)
GLUCOSE: 88 mg/dL (ref 70–99)
POTASSIUM: 3.4 meq/L — AB (ref 3.7–5.3)
Sodium: 144 mEq/L (ref 137–147)

## 2014-01-19 LAB — PROTIME-INR
INR: 1.14 (ref 0.00–1.49)
PROTHROMBIN TIME: 14.6 s (ref 11.6–15.2)

## 2014-01-19 MED ORDER — WARFARIN SODIUM 5 MG PO TABS: 5.0000 mg | ORAL_TABLET | Freq: Once | ORAL | Status: DC

## 2014-01-19 MED ORDER — ENSURE PUDDING PO PUDG: 1.0000 | Freq: Three times a day (TID) | ORAL | Status: DC

## 2014-01-19 MED ORDER — POTASSIUM CHLORIDE ER 20 MEQ PO TBCR: 20.0000 meq | EXTENDED_RELEASE_TABLET | Freq: Every day | ORAL | Status: AC

## 2014-01-19 MED ORDER — WARFARIN SODIUM 5 MG PO TABS: 5.0000 mg | ORAL_TABLET | Freq: Every day | ORAL | Status: DC

## 2014-01-19 MED ORDER — POTASSIUM CHLORIDE 10 MEQ/100ML IV SOLN
10.0000 meq | INTRAVENOUS | Status: AC
  Administered 2014-01-19 (×3): 10 meq via INTRAVENOUS
  Filled 2014-01-19 (×3): qty 100

## 2014-01-19 MED ORDER — ASPIRIN 81 MG PO TBEC: 81.0000 mg | DELAYED_RELEASE_TABLET | Freq: Every day | ORAL | Status: AC

## 2014-01-19 NOTE — Progress Notes (Signed)
Patient is being sent d/c to SNF, report called to the receiving nurse [facility]. Condition fair.

## 2014-01-19 NOTE — Discharge Summary (Signed)
Physician Discharge Summary  Cristina Patton DGU:[id number] DOB: [birth year] DOA: 01/14/2014  PCP: PROVIDER NOT IN SYSTEM  Admit date: 01/14/2014 Discharge date: 01/19/2014  Time spent: 35 minutes  Recommendations for Outpatient Follow-up:  1. Need daily INR, adjust coumadin as need. When INR at goal please discontinue aspirin.  2. Need B-met to follow sodium level. Please encourage patient  to drink fluid. Needs further conversation with palliative care.  3. Needs cbc to follow WBC.   Discharge Diagnoses:  Active Problems:   Stroke   Dysphagia, unspecified(787.20)   Weakness generalized   Palliative care encounter   Discharge Condition: Stable.   Diet recommendation: Dysphagia 1 diet.   Filed Weights   01/18/14 1005  Weight: 52.2 kg (115 lb 1.3 oz)    History of present illness:  80 y.o. female with dementia, HTN, recent admission fro CVA and aspiration PNA in August 2015, residual left hemiparesis since November 2014, presented from [facility] for evaluation of worsening lethargy. Please note that pt is obtunded at the time of the arrival to the [facility] and currently unable to provide any history, most of the details obtained from ED doctor and available records from SNF. Per record review, pt noted to be progressively more lethargic since earlier in the day, no reported fevers, chills, no noted dyspnea, no abd concerns reported.  In ED, pt noted to be lethargic, unable to follow any commands and unable to answer questions. CT head notable for ssymmetric focal subcortical area of white matter hypoattenuation in the high left frontal lobe is concerning for an acute nonhemorrhagic infarct. MRI requested and currently pending. VSS with low grade fever 99.3 F. Blood work notable for mild leukocytosis 13.5, K 3.4. Neurology consulted by ED doctor and [place] asked to admit for further evaluation. Telemetry bed requested.    Hospital Course:  Acute Encephalopathy secondary to Acute CVA ;  admitted with recurrent stroke.  -CT head: Asymmetric focal subcortical area of white matter hypoattenuation in the high left frontal lobe is concerning for an acute nonhemorrhagic infarct  -MRI head: New left centrum semiovale nonhemorrhagic infarcts  -Neurology consulted and appreciated, pending further recommendations  -LDL 65, hemoglobin A1c 5.7.  -Echocardiogram 12/19/2013: EF 55-60%  -Carotid doppler 12/20/2013: 1-39% ICA stenosis bilaterally. Antegrade vertebral flow.  -Dr Erlinda Hong discussed with nephew Barnabas Lister, plan to start coumadin per pharmacy to dose. Discontinue aspirin when INR at goal.  -As an outpatient was Aspirin 366m and plavix 790mdaily  -Started on Dysphagia 1 diet 9-15.  -Started on coumadin, per neuro recommendation.  - Hypernatremia; patient not drinking enough, not eating well, she hold food in her mouth. Resolved with half NS, would not start D 5 IV fluid due to recent stroke. Resolved. Encourage patient to drink. Need further discussion with palliative care.   Sinus Tachycardia: will resume home dose metoprolol. Improved.   Hypokalemia: replete IV. Oral supplement.   Severe protein calorie malnutrition  -From underlying CVA and dementia  -Last speech recommendation from prior hospitalization recommended dysphagia 1 diet  -continue aspiration precautions  -Dysphagia 1 diet.   Hypertension  -Continue Norvasc and metoprolol.  -hydralazine when necessary   Leukocytosis no fevers.  -Resolved  -UA and chest x-ray show no infectious etiology  -Continue to monitor   Functional quadriplegia  -Complicated by failure to thrive and deconditioning  -PT and OT consulted   Thrombocytosis, chronic. On aspirin.    Procedures:  none  Consultations:  Neurology  Palliative care,   Discharge Exam:  Filed Vitals:   01/19/14 1018  BP: 168/77  Pulse: 94  Temp: 98.4 F (36.9 C)  Resp: 20    General: No distress.  Cardiovascular: S 1, S 2 RRR Respiratory:  CTA  Discharge Instructions You were cared for by a hospitalist during your hospital stay. If you have any questions about your discharge medications or the care you received while you were in the hospital after you are discharged, you can call the unit and asked to speak with the hospitalist on call if the hospitalist that took care of you is not available. Once you are discharged, your primary care physician will handle any further medical issues. Please note that NO REFILLS for any discharge medications will be authorized once you are discharged, as it is imperative that you return to your primary care physician (or establish a relationship with a primary care physician if you do not have one) for your aftercare needs so that they can reassess your need for medications and monitor your lab values.  Discharge Instructions   Diet - low sodium heart healthy    Complete by:  As directed      Increase activity slowly    Complete by:  As directed           Current Discharge Medication List    START taking these medications   Details  feeding supplement, ENSURE, (ENSURE) PUDG Take 1 Container by mouth 3 (three) times daily between meals. Qty: 30 Can, Refills: 0    potassium chloride 20 MEQ TBCR Take 20 mEq by mouth daily. Qty: 5 tablet, Refills: 0    warfarin (COUMADIN) 5 MG tablet Take 1 tablet (5 mg total) by mouth daily. Qty: 30 tablet, Refills: 0      CONTINUE these medications which have CHANGED   Details  aspirin EC 81 MG EC tablet Take 1 tablet (81 mg total) by mouth daily. Qty: 30 tablet, Refills: 0      CONTINUE these medications which have NOT CHANGED   Details  amLODipine (NORVASC) 10 MG tablet Take 10 mg by mouth every morning.    atorvastatin (LIPITOR) 20 MG tablet Take 1 tablet (20 mg total) by mouth daily at 6 PM.    metoprolol tartrate (LOPRESSOR) 25 MG tablet Take 1 tablet (25 mg total) by mouth 2 (two) times daily.    polyethylene glycol (MIRALAX / GLYCOLAX)  packet Take 17 g by mouth daily.      STOP taking these medications     clopidogrel (PLAVIX) 75 MG tablet        No Known Allergies Follow-up Information   Follow up with Xu,Jindong, MD In 2 months.   Specialty:  Neurology   Contact information:   [address] [place] [address] [phone]        The results of significant diagnostics from this hospitalization (including imaging, microbiology, ancillary and laboratory) are listed below for reference.    Significant Diagnostic Studies: Ct Head Wo Contrast  01/14/2014   CLINICAL DATA:  Altered mental status. Decreased level of consciousness. Recent infarcts.  EXAM: CT HEAD WITHOUT CONTRAST  TECHNIQUE: Contiguous axial images were obtained from the base of the skull through the vertex without intravenous contrast.  COMPARISON:  MRI brain 12/17/2013  FINDINGS: Asymmetric subcortical white matter hypoattenuation in the high left frontal lobe on images 20 and 21 of series 201 is concerning for an acute nonhemorrhagic infarct. Mild generalized atrophy and white matter disease is otherwise stable. There is  no hemorrhage or mass. Remote infarcts of the basal ganglia are noted bilaterally.  The ventricles are proportionate to the degree of atrophy. No significant extra-axial fluid collection is present. Atherosclerotic changes are again noted within the cavernous carotid arteries.  IMPRESSION: 1. Asymmetric focal subcortical area of white matter hypoattenuation in the high left frontal lobe is concerning for an acute nonhemorrhagic infarct. MRI could be used for confirmation. 2. Otherwise stable atrophy and white matter disease. 3. Atherosclerosis.   Electronically Signed   By: Lawrence Santiago M.D.   On: 01/14/2014 16:15   [name] Wo Contrast  01/14/2014   CLINICAL DATA:  80 year old female with decreased level of consciousness. High blood pressure.  EXAM: MRI HEAD WITHOUT CONTRAST  MRA HEAD WITHOUT CONTRAST  TECHNIQUE:  Multiplanar, multiecho pulse sequences of the brain and surrounding structures were obtained without intravenous contrast. Angiographic images of the head were obtained using MRA technique without contrast.  COMPARISON:  01/14/2014 CT.  12/17/2013 MR.  FINDINGS: MRI HEAD FINDINGS  New left centrum semiovale nonhemorrhagic infarcts.  Subacute right centrum semiovale infarcts.  Remote left occipital lobe infarct.  Prominent small vessel disease type changes.  No intracranial hemorrhage.  No intracranial mass lesion noted on this unenhanced exam.  Global atrophy without hydrocephalus.  Partially empty non expanded sella incidentally noted.  Partial opacification right mastoid air cells without obstructing mass seen causing eustachian tube dysfunction.  MRA HEAD FINDINGS  Fusiform ectasia pre cavernous segment right internal carotid artery. Mild narrowing and irregularity  Ectatic left internal carotid artery distal vertical cervical segment.  Mild ectasia and slight irregularity with minimal narrowing involving portions of the cavernous segment of the internal carotid artery bilaterally.  Mild narrowing supraclinoid segment left internal carotid artery.  Moderate to marked tandem stenosis A2 segment anterior cerebral artery greater on the right.  Moderate narrowing proximal M2 segment and marked narrowing M3 segment right middle cerebral artery branches. Mild to moderate narrowing left middle cerebral artery branches.  Left vertebral artery ends in a posterior inferior cerebellar artery distribution.  Ectatic right vertebral artery without significant narrowing.  Nonvisualized right posterior inferior cerebellar artery.  Mild to moderate tandem stenosis basilar artery.  Nonvisualized left anterior inferior cerebellar artery.  Moderate to marked narrowing proximal left superior cerebellar artery.  Moderate to marked tandem stenosis portions of the posterior cerebral artery bilaterally.  No aneurysm visualized.   IMPRESSION: MRI HEAD:  New left centrum semiovale nonhemorrhagic infarcts.  Subacute right centrum semiovale infarcts.  Remote left occipital lobe infarct.  Prominent small vessel disease type changes.  No intracranial hemorrhage.  Partial opacification right mastoid air cells without obstructing mass seen causing eustachian tube dysfunction.  MRA HEAD FINDINGS  Intracranial atherosclerotic type changes most notable involving medium size vessels and branch vessels but also involving the basilar artery and supraclinoid segment left internal internal carotid artery as detailed above.   Electronically Signed   By: Chauncey Cruel M.D.   On: 01/14/2014 20:57   Mr Brain Wo Contrast  01/14/2014   CLINICAL DATA:  80 year old female with decreased level of consciousness. High blood pressure.  EXAM: MRI HEAD WITHOUT CONTRAST  MRA HEAD WITHOUT CONTRAST  TECHNIQUE: Multiplanar, multiecho pulse sequences of the brain and surrounding structures were obtained without intravenous contrast. Angiographic images of the head were obtained using MRA technique without contrast.  COMPARISON:  01/14/2014 CT.  12/17/2013 MR.  FINDINGS: MRI HEAD FINDINGS  New left centrum semiovale nonhemorrhagic infarcts.  Subacute right centrum semiovale infarcts.  Remote left occipital lobe infarct.  Prominent small vessel disease type changes.  No intracranial hemorrhage.  No intracranial mass lesion noted on this unenhanced exam.  Global atrophy without hydrocephalus.  Partially empty non expanded sella incidentally noted.  Partial opacification right mastoid air cells without obstructing mass seen causing eustachian tube dysfunction.  MRA HEAD FINDINGS  Fusiform ectasia pre cavernous segment right internal carotid artery. Mild narrowing and irregularity  Ectatic left internal carotid artery distal vertical cervical segment.  Mild ectasia and slight irregularity with minimal narrowing involving portions of the cavernous segment of the internal carotid  artery bilaterally.  Mild narrowing supraclinoid segment left internal carotid artery.  Moderate to marked tandem stenosis A2 segment anterior cerebral artery greater on the right.  Moderate narrowing proximal M2 segment and marked narrowing M3 segment right middle cerebral artery branches. Mild to moderate narrowing left middle cerebral artery branches.  Left vertebral artery ends in a posterior inferior cerebellar artery distribution.  Ectatic right vertebral artery without significant narrowing.  Nonvisualized right posterior inferior cerebellar artery.  Mild to moderate tandem stenosis basilar artery.  Nonvisualized left anterior inferior cerebellar artery.  Moderate to marked narrowing proximal left superior cerebellar artery.  Moderate to marked tandem stenosis portions of the posterior cerebral artery bilaterally.  No aneurysm visualized.  IMPRESSION: MRI HEAD:  New left centrum semiovale nonhemorrhagic infarcts.  Subacute right centrum semiovale infarcts.  Remote left occipital lobe infarct.  Prominent small vessel disease type changes.  No intracranial hemorrhage.  Partial opacification right mastoid air cells without obstructing mass seen causing eustachian tube dysfunction.  MRA HEAD FINDINGS  Intracranial atherosclerotic type changes most notable involving medium size vessels and branch vessels but also involving the basilar artery and supraclinoid segment left internal internal carotid artery as detailed above.   Electronically Signed   By: Chauncey Cruel M.D.   On: 01/14/2014 20:57   Dg Chest Port 1 View  01/14/2014   CLINICAL DATA:  Altered mental status.  EXAM: PORTABLE CHEST - 1 VIEW  COMPARISON:  12/17/2013  FINDINGS: Patient is rotated to the right. Lungs are adequately inflated without focal consolidation or effusion. Cardiomediastinal silhouette and remainder the exam is unchanged.  IMPRESSION: No active disease.   Electronically Signed   By: Marin Olp M.D.   On: 01/14/2014 16:34     Microbiology: Recent Results (from the past 240 hour(s))  CULTURE, BLOOD (ROUTINE X 2)     Status: None   Collection Time    01/14/14  3:36 PM      Result Value Ref Range Status   Specimen Description BLOOD ARM RIGHT   Final   Special Requests BOTTLES DRAWN AEROBIC AND ANAEROBIC 5CC   Final   Culture  Setup Time     Final   Value: 01/14/2014 20:22     Performed at [facility]   Culture     Final   Value:        BLOOD CULTURE RECEIVED NO GROWTH TO DATE CULTURE WILL BE HELD FOR 5 DAYS BEFORE ISSUING A FINAL NEGATIVE REPORT     Performed at [facility]   Report Status PENDING   Incomplete  CULTURE, BLOOD (ROUTINE X 2)     Status: None   Collection Time    01/14/14  3:49 PM      Result Value Ref Range Status   Specimen Description BLOOD LEFT FOREARM   Final   Special Requests BOTTLES DRAWN AEROBIC AND ANAEROBIC 5CC   Final  Culture  Setup Time     Final   Value: 01/14/2014 20:22     Performed at [facility]   Culture     Final   Value:        BLOOD CULTURE RECEIVED NO GROWTH TO DATE CULTURE WILL BE HELD FOR 5 DAYS BEFORE ISSUING A FINAL NEGATIVE REPORT     Performed at [facility]   Report Status PENDING   Incomplete     Labs: Basic Metabolic Panel:  Recent Labs Lab 01/16/14 0628 01/17/14 0938 01/17/14 1820 01/18/14 0721 01/19/14 0442  NA 150* 155* 154* 148* 144  K 3.0* 3.4* 3.9 3.2* 3.4*  CL 110 114* 115* 109 104  CO2 24 25 26 24 21   GLUCOSE 94 123* 111* 92 88  BUN 14 15 14 12 9   CREATININE 0.45* 0.47* 0.42* 0.40* 0.38*  CALCIUM 9.4 9.3 8.9 8.9 9.2   Liver Function Tests:  Recent Labs Lab 01/14/14 1511  AST 22  ALT 8  ALKPHOS 75  BILITOT 1.0  PROT 8.5*  ALBUMIN 3.5   No results found for this basename: LIPASE, AMYLASE,  in the last 168 hours No results found for this basename: AMMONIA,  in the last 168 hours CBC:  Recent Labs Lab 01/14/14 1511 01/14/14 1611 01/15/14 0544 01/16/14 0628 01/18/14 0721  01/19/14 0442  WBC 13.5*  --  9.9 8.2 9.2 12.3*  NEUTROABS 10.2*  --   --   --   --   --   HGB 14.3 16.0* 12.3 11.7* 11.0* 11.9*  HCT 43.0 47.0* 37.0 35.5* 34.0* 36.4  MCV 82.4  --  81.0 80.9 83.5 80.9  PLT 747*  --  720* 776* 669* 765*   Cardiac Enzymes: No results found for this basename: CKTOTAL, CKMB, CKMBINDEX, TROPONINI,  in the last 168 hours BNP: BNP (last 3 results) No results found for this basename: PROBNP,  in the last 8760 hours CBG:  Recent Labs Lab 01/14/14 1648  GLUCAP 125*       Signed:  Huyen Perazzo A  [facility] 01/19/2014, 10:37 AM

## 2014-01-19 NOTE — Progress Notes (Signed)
Pt discharged by ambulance to [facility].  Family aware of discharge.  No further CSW needs.  Apolonio Schneiders (weekend coverage) [phone]

## 2014-01-20 ENCOUNTER — Encounter: Payer: Self-pay | Admitting: Internal Medicine

## 2014-01-20 LAB — CULTURE, BLOOD (ROUTINE X 2)
CULTURE: NO GROWTH
CULTURE: NO GROWTH

## 2014-01-20 NOTE — Progress Notes (Signed)
Patient ID: Cristina Patton, female   DOB: 1926/09/24, 80 y.o.   MRN: [id number]   This is an acute visit.  For care skilled.  [facility]  Chief complaint-increased lethargy.  History of present illness.  Patient is an 80 year old female who was recently admitted to facility after hospitalization were what appeared to be an acute CVA watershed stroke with resulting left-sided weakness.  This is complicated with a history of accelerated hypertension.--She is on Norvasc-a beta blocker-as well as a statin  Apparently earlier today nursing staff thought she was somewhat more lethargic-.  Patient's blood pressure systolically initially was 180 however when checked manually was 158/80-pulse rate apparently was elevated at 120 which is came down to just over 100.  Initially patient was lethargic when I entered the room however when she recognized the nurse walked in the room she was able to talk and according to the nurse was relatively at her baseline-nurse stated that in the morning she often gets somewhat lethargic.  She does continue with left-sided significant weakness she is able to speak apparently at baseline although fairly minimal-- according to the nurse who is familiar with her. This is baseline for her earlier in the day and is not speaking  Family medical social history as been reviewed I note she is on Plavix with a history of CVA-progress note on 12/26/2013 has been reviewed.  Her medications have been reviewed.  Per MAR.  Review of systems-this is limited secondary to lethargy-she is not complaining of pain shortness of breath headache or dizziness although she appears to be somewhat of a poor historian and does not speak much.  Physical exam.  She is afebrile pulse 105 respirations 18 blood pressure taken manually 158/80.  In general this is a frail elderly female who appears somewhat lethargic but was responsive 2 tactile stimulation and eventually verbal  stimulation.--She appeared to perk up when she saw the nurse she was familiar with  Her skin is warm and dry  Eyes-pupils appear to be reactive to light sclera and conjunctiva are clear.  Chest is clear to auscultation with somewhat poor respiratory effort.  Heart is slightly tachycardic at 105-is regular rhythm-she does not really have significant lower extremity edema.  Abdomen appears to be soft nontender with positive bowel sounds.  Muscle skeletal she has generalized weakness this is most prominent on the left side upper and lower extremities.  Neurologic she is more alert than when I stepped in the room she was able to recognize her regular nurse and did speak up with her again very short verbalization--she has significant left-sided weakness Is able to move her right-sided extremities   The labs.  12/19/2013.  WBC 9.4-hemoglobin 12.2-platelets a 53.  Sodium 139 potassium 3.8-BUN 11 creatinine 0.5.  Assessment and plan.  #1-history of lethargy-with patient's history of significant CVA-there is the possibility a progressive neurologic event-even though she does not appear to be unstable--concern enough here to send her to the ER for expedient evaluation.  CPT [phone]

## 2014-01-21 ENCOUNTER — Non-Acute Institutional Stay (SKILLED_NURSING_FACILITY): Payer: Medicare Other | Admitting: Internal Medicine

## 2014-01-21 DIAGNOSIS — R532 Functional quadriplegia: Secondary | ICD-10-CM

## 2014-01-21 DIAGNOSIS — D72829 Elevated white blood cell count, unspecified: Secondary | ICD-10-CM

## 2014-01-21 DIAGNOSIS — E870 Hyperosmolality and hypernatremia: Secondary | ICD-10-CM

## 2014-01-21 DIAGNOSIS — I639 Cerebral infarction, unspecified: Secondary | ICD-10-CM

## 2014-01-21 DIAGNOSIS — I10 Essential (primary) hypertension: Secondary | ICD-10-CM

## 2014-01-21 DIAGNOSIS — I6350 Cerebral infarction due to unspecified occlusion or stenosis of unspecified cerebral artery: Secondary | ICD-10-CM

## 2014-01-22 LAB — BASIC METABOLIC PANEL
BUN: 23 mg/dL — AB (ref 4–21)
Creatinine: 0.6 mg/dL (ref 0.5–1.1)
GLUCOSE: 139 mg/dL
Potassium: 4.1 mmol/L (ref 3.4–5.3)
SODIUM: 151 mmol/L — AB (ref 137–147)

## 2014-01-22 LAB — CBC AND DIFFERENTIAL
HCT: 38 % (ref 36–46)
HEMOGLOBIN: 12.6 g/dL (ref 12.0–16.0)
Platelets: 747 10*3/uL — AB (ref 150–399)
WBC: 10.1 10^3/mL

## 2014-01-23 ENCOUNTER — Non-Acute Institutional Stay (SKILLED_NURSING_FACILITY): Payer: Medicare Other | Admitting: Internal Medicine

## 2014-01-23 DIAGNOSIS — I639 Cerebral infarction, unspecified: Secondary | ICD-10-CM

## 2014-01-23 DIAGNOSIS — I6350 Cerebral infarction due to unspecified occlusion or stenosis of unspecified cerebral artery: Secondary | ICD-10-CM

## 2014-01-23 DIAGNOSIS — D72829 Elevated white blood cell count, unspecified: Secondary | ICD-10-CM

## 2014-01-23 DIAGNOSIS — Z710 Person encountering health services to consult on behalf of another person: Secondary | ICD-10-CM

## 2014-01-23 DIAGNOSIS — R532 Functional quadriplegia: Secondary | ICD-10-CM

## 2014-01-23 DIAGNOSIS — E43 Unspecified severe protein-calorie malnutrition: Secondary | ICD-10-CM

## 2014-01-23 DIAGNOSIS — E870 Hyperosmolality and hypernatremia: Secondary | ICD-10-CM

## 2014-01-23 DIAGNOSIS — G9340 Encephalopathy, unspecified: Secondary | ICD-10-CM

## 2014-01-23 DIAGNOSIS — I10 Essential (primary) hypertension: Secondary | ICD-10-CM

## 2014-01-25 ENCOUNTER — Ambulatory Visit (INDEPENDENT_AMBULATORY_CARE_PROVIDER_SITE_OTHER): Payer: Medicare Other | Admitting: Neurology

## 2014-01-25 ENCOUNTER — Encounter (INDEPENDENT_AMBULATORY_CARE_PROVIDER_SITE_OTHER): Payer: Self-pay

## 2014-01-25 ENCOUNTER — Encounter: Payer: Self-pay | Admitting: Neurology

## 2014-01-25 ENCOUNTER — Ambulatory Visit: Payer: Medicare Other | Admitting: Neurology

## 2014-01-25 VITALS — BP 191/93 | HR 89

## 2014-01-25 DIAGNOSIS — I639 Cerebral infarction, unspecified: Secondary | ICD-10-CM

## 2014-01-25 DIAGNOSIS — I10 Essential (primary) hypertension: Secondary | ICD-10-CM

## 2014-01-25 DIAGNOSIS — I6350 Cerebral infarction due to unspecified occlusion or stenosis of unspecified cerebral artery: Secondary | ICD-10-CM

## 2014-01-25 NOTE — Progress Notes (Signed)
STROKE NEUROLOGY FOLLOW UP NOTE  NAME: Cristina Patton DOB: 04/05/1927  REASON FOR VISIT: stroke follow up HISTORY FROM: chart  Today we had Cristina pleasure of seeing Cristina Patton in follow-up at our Neurology Clinic. Pt was accompanied by CMA.   History Summary Cristina Patton is a 80 y.o. female with history of HTN, chronic left occipital stroks was discharged from [facility] in 12/2013 due to new scattered infarcts at right A2 distribution. MRA showed diffuse atherosclerosis at intracranial vessels, so Cristina stroke was considered thrombotic and Cristina Patton was put on ASA and plavix for 3-4 weeks. Cristina Patton was re-admitted about one week ago due to AMS and MRI again found to have subacute right and acute left semi ovale stroke, concerning for cardioembolic stroke. Started on coumadin before discharge to SNF.  Interval History During Cristina interval time, Cristina Patton has been doing Cristina same since discharged one week ago. Cristina Patton still was lethargic but awake on voice stimulation. Cristina Patton is on coumadin now and last INR check on 01/22/14 was therapeutic. Cristina Patton is also on ASA 81mg  for no clear reasons. Cristina Patton BP was high in clinic which is 191/93.    REVIEW OF SYSTEMS: Full 14 system review of systems performed and notable only for those listed below and in HPI above, all others are negative:  Constitutional: N/A  Cardiovascular: N/A  Ear/Nose/Throat: N/A  Skin: N/A  Eyes: N/A  Respiratory: N/A  Gastroitestinal: N/A  Genitourinary: N/A Hematology/Lymphatic: N/A  Endocrine: N/A  Musculoskeletal: N/A  Allergy/Immunology: N/A  Neurological: N/A  Psychiatric: N/A  Cristina following represents Cristina Patton's updated allergies and side effects list: No Known Allergies  Labs since last visit of relevance include Cristina following: Results for orders placed in visit on 01/23/14  CBC AND DIFFERENTIAL      Result Value Ref Range   Hemoglobin 12.6  12.0 - 16.0 g/dL   HCT 38  36 - 46 %   Platelets 747 (*) 150 - 399  K/L   WBC 54.6    BASIC METABOLIC PANEL      Result Value Ref Range   Glucose 139     BUN 23 (*) 4 - 21 mg/dL   Creatinine 0.6  0.5 - 1.1 mg/dL   Potassium 4.1  3.4 - 5.3 mmol/L   Sodium 151 (*) 137 - 147 mmol/L    Cristina neurologically relevant items on Cristina Patton's problem list were reviewed on today's visit.  Neurologic Examination  A problem focused neurological exam (12 or more points of Cristina single system neurologic examination, vital signs counts as 1 point, cranial nerves count for 8 points) was performed.  Blood pressure 191/93, pulse 89, height 0' (0 m), weight 0 lb (0 kg).  General - cachectic, malnourished, lethargic and upper body leaning towards left  Ophthalmologic - not able to see through.  Cardiovascular - Regular rate and rhythm with no murmur.  Neuro - lethargic, orientated to herself, not orientated to time, place. PERRL, nasolabial fold symmetrical, tongue in middle, left UE 2/5, and BLE 3/5 with painful stimulation, right UE spontaneous movement against gravity. BLE increased muscle tone. Sensation bilateral symmetrical. Bilateral babinsik positive.   Data reviewed: I personally reviewed Cristina images and agree with Cristina radiology interpretations.  Ct Head Wo Contrast  01/14/2014 IMPRESSION: 1. Asymmetric focal subcortical area of white matter hypoattenuation in Cristina high left frontal lobe is concerning for an acute nonhemorrhagic infarct. MRI could be used for confirmation. 2. Otherwise stable atrophy and white  matter disease. 3. Atherosclerosis.  MRI and MRA 12/17/13  1. Patchy acute infarcts in Cristina right ACA / MCA watershed territory involving Cristina superior frontal gyrus, mostly in Cristina pre motor area. No mass effect or hemorrhage.  2. New distal right ACA occlusion and high-grade irregularity of Cristina A2 segment, concordant with #1. Right MCA M2 branch atherosclerosis  and flow appears stable since 2014.  3. Interval mild progression of right ICA siphon and basilar  artery atherosclerosis, but no other new or progressed hemodynamically significant stenosis compared to Cristina 2014 MRA. Mri and Mra Head Wo Contrast  01/14/2014 IMPRESSION: MRI HEAD: New left centrum semiovale nonhemorrhagic infarcts. Subacute right centrum semiovale infarcts. Remote left occipital lobe infarct. Prominent small vessel disease type changes. No intracranial hemorrhage. Partial opacification right mastoid air cells without obstructing mass seen causing eustachian tube dysfunction. MRA HEAD FINDINGS Intracranial atherosclerotic type changes most notable involving medium size vessels and branch vessels but also involving Cristina basilar artery and supraclinoid segment left internal internal carotid artery as detailed above.  CUS 12/2013 - Bilateral: 1-39% ICA stenosis. Vertebral artery flow is antegrade.  2D ehco 12/2013 - - Left ventricle: Cristina cavity size was normal. Systolic function was normal. Cristina estimated ejection fraction was in Cristina range of 55% to 60%. Wall motion was normal; there were no regional wall motion abnormalities. - Aortic valve: Moderate thickening and calcification, consistent with sclerosis. There was trivial regurgitation. - Mitral valve: Severely calcified annulus. There was trivial regurgitation. - Tricuspid valve: There was trivial regurgitation. - Pulmonic valve: There was mild regurgitation.  Assessment: As you may recall, Cristina Patton is a 80 y.o. African American female with PMH of  HTN, chronic left occipital strokes in Cristina past was re-admitted for AMS. Found to have new b/l coronal radiata strokes. Cristina Patton also had right ACA A2 stroke ACA territory . All together, these strokes are of embolic pattern. Started coumadin one week ago before d/c to SNF. Currently in SNF check INR was therapeutic.   Plan:  - continue coumadin and INR goal 2-3 - please d/c ASA 81mg  - today Cristina Patton BP was high. Cristina Patton needs to adjust Cristina Patton HTN meds to reduce bleeding risk while on coumadin - continue  lipitor for stroke prevention - continue PT/OT/speech - follow up with PCP for stroke risk factor modification - RTC in 2 months.  No orders of Cristina defined types were placed in this encounter.    Meds ordered this encounter  Medications  . clopidogrel (PLAVIX) 75 MG tablet    Sig: Take 75 mg by mouth daily.  Marland Kitchen warfarin (COUMADIN) 6 MG tablet    Sig: Take 6 mg by mouth daily.      Rosalin Hawking, MD PhD [facility] [address], [place], [place] [address] [phone]

## 2014-01-26 ENCOUNTER — Encounter: Payer: Self-pay | Admitting: Internal Medicine

## 2014-01-26 DIAGNOSIS — Z710 Person encountering health services to consult on behalf of another person: Secondary | ICD-10-CM | POA: Insufficient documentation

## 2014-01-26 DIAGNOSIS — R532 Functional quadriplegia: Secondary | ICD-10-CM | POA: Insufficient documentation

## 2014-01-26 NOTE — Assessment & Plan Note (Addendum)
patient not drinking enough, not eating well, she hold food in her mouth. Resolved with half NS, would not start D 5 IV fluid due to recent stroke. Resolved. In SNF pt's Na just came back at 151. IVF with 1/2 NS at 100 cc/hr for 1 liter

## 2014-01-26 NOTE — Assessment & Plan Note (Signed)
Spoke at length with pt's nephew, who is her POA and his wife. Pt has no children but large extended family. Filled out MOST form with nephew. . NO to PEG. IVF maybe to give family time to see her one last time and other interventions on case by case. POA seems to understand she is at end of life.

## 2014-01-26 NOTE — Assessment & Plan Note (Signed)
As above, under acute encephalopathy

## 2014-01-26 NOTE — Assessment & Plan Note (Signed)
-  Complicated by failure to thrive and deconditioning  -PT and OT consulted

## 2014-01-26 NOTE — Progress Notes (Signed)
MRN: [id number] Name: Cristina Patton  Sex: female Age: 80 y.o. DOB: May 25, 1926  [place] [facility]: [name] Facility/Room: 208W Level Of Care: SNF Provider: Inocencio Homes D Emergency Contacts: Extended Emergency Contact Information Primary Emergency Contact: [place] of [place] Phone: [phone] Relation: Friend Secondary Emergency Contact: Yoshida,Kim Address: [address]          [place], [place] [address] [name] of [place] Work Phone: [phone] Mobile Phone: [phone] Relation: Niece  Code Status: DNR  Allergies: Review of patient's allergies indicates no known allergies.  Chief Complaint  Patient presents with  . nursing home admission    HPI: Patient is 80 y.o. female who is admitted after recurrent CVA leaving her a functional quadriplegic with dementia.  Past Medical History  Diagnosis Date  . Hypertension   . CVA (cerebral infarction)   . Stroke     History reviewed. No pertinent past surgical history.    Medication List       This list is accurate as of: 01/23/14 11:59 PM.  Always use your most recent med list.               amLODipine 10 MG tablet  Commonly known as:  NORVASC  Take 10 mg by mouth every morning.     aspirin 81 MG EC tablet  Take 1 tablet (81 mg total) by mouth daily.     atorvastatin 20 MG tablet  Commonly known as:  LIPITOR  Take 1 tablet (20 mg total) by mouth daily at 6 PM.     feeding supplement (ENSURE) Pudg  Take 1 Container by mouth 3 (three) times daily between meals.     metoprolol tartrate 25 MG tablet  Commonly known as:  LOPRESSOR  Take 1 tablet (25 mg total) by mouth 2 (two) times daily.     polyethylene glycol packet  Commonly known as:  MIRALAX / GLYCOLAX  Take 17 g by mouth daily.     Potassium Chloride ER 20 MEQ Tbcr  Take 20 mEq by mouth daily.     warfarin 5 MG tablet  Commonly known as:  COUMADIN  Take 1 tablet (5 mg total) by mouth daily.        No  orders of the defined types were placed in this encounter.     There is no immunization history on file for this patient.  History  Substance Use Topics  . Smoking status: Never Smoker   . Smokeless tobacco: Not on file  . Alcohol Use: No    Family history is noncontributory    Review of Systems    UTO -per nursing not eating or drinking, not very responsive  Filed Vitals:   01/26/14 2039  BP: 130/75  Pulse: 98  Temp: 98.8 F (37.1 C)  Resp: 18    Physical Exam  GENERAL APPEARANCE: somnalent  SKIN: No diaphoresis rash HEAD: Normocephalic, atraumatic  EYES: Conjunctiva/lids clear. Pupils round, reactive. EOMs intact.  EARS: External exam WNL, canals clear. Hearing grossly normal.  NOSE: No deformity or discharge.  MOUTH/THROAT: Lips w/o lesions  RESPIRATORY: Breathing is even, unlabored. Lung sounds are clear   CARDIOVASCULAR: Heart RRR no murmurs, rubs or gallops. No peripheral edema.   GASTROINTESTINAL: Abdomen is soft, non-tender, not distended w/ normal bowel sounds. GENITOURINARY: Bladder non tender, not distended  MUSCULOSKELETAL: No abnormal joints or musculature NEUROLOGIC:  Pt minimally responsive, did not move PSYCHIATRIC:  no behavioral issues  Patient Active Problem List   Diagnosis  Date Noted  . Functional quadriplegia 01/26/2014  . Encounter for family conference without patient present 01/26/2014  . Essential hypertension 01/25/2014  . Dysphagia, unspecified(787.20) 01/18/2014  . Weakness generalized 01/18/2014  . Palliative care encounter 01/18/2014  . Stroke 01/14/2014  . Inguinal hernia 12/26/2013  . Acute CVA (cerebrovascular accident) 12/18/2013  . CAP (community acquired pneumonia) 12/17/2013  . History of stroke 12/17/2013  . Accelerated hypertension 12/17/2013  . Left hemiparesis 12/17/2013  . Essential thrombocythemia 12/17/2013  . Unspecified constipation 06/18/2013  . Urinary retention 05/10/2013  . UTI (urinary tract infection)  04/03/2013  . GI bleed 04/02/2013  . Acute blood loss anemia 04/02/2013  . CVA (cerebral infarction) 04/02/2013  . Protein-calorie malnutrition, severe 03/21/2013  . Dehydration 03/20/2013  . Acute renal failure 03/20/2013  . Leukocytosis, unspecified 03/20/2013  . Acute encephalopathy 03/20/2013  . Fall at home- ? syncope. 03/20/2013  . Hypernatremia 03/20/2013  . HTN (hypertension) 03/20/2013  . Hypothermia 03/20/2013  . Transaminitis 03/20/2013    CBC    Component Value Date/Time   WBC 10.1 01/22/2014   WBC 12.3* 01/19/2014 0442   RBC 4.50 01/19/2014 0442   HGB 12.6 01/22/2014   HCT 38 01/22/2014   PLT 747* 01/22/2014   MCV 80.9 01/19/2014 0442   LYMPHSABS 1.4 01/14/2014 1511   MONOABS 1.9* 01/14/2014 1511   EOSABS 0.0 01/14/2014 1511   BASOSABS 0.0 01/14/2014 1511    CMP     Component Value Date/Time   NA 151* 01/22/2014   NA 144 01/19/2014 0442   K 4.1 01/22/2014   CL 104 01/19/2014 0442   CO2 21 01/19/2014 0442   GLUCOSE 88 01/19/2014 0442   BUN 23* 01/22/2014   BUN 9 01/19/2014 0442   CREATININE 0.6 01/22/2014   CREATININE 0.38* 01/19/2014 0442   CALCIUM 9.2 01/19/2014 0442   PROT 8.5* 01/14/2014 1511   ALBUMIN 3.5 01/14/2014 1511   AST 22 01/14/2014 1511   ALT 8 01/14/2014 1511   ALKPHOS 75 01/14/2014 1511   BILITOT 1.0 01/14/2014 1511   GFRNONAA >90 01/19/2014 0442   GFRAA >90 01/19/2014 0442    Assessment and Plan  Acute encephalopathy secondary to Acute CVA ; admitted with recurrent stroke.  -CT head: Asymmetric focal subcortical area of white matter hypoattenuation in the high left frontal lobe is concerning for an acute nonhemorrhagic infarct  -MRI head: New left centrum semiovale nonhemorrhagic infarcts  -Neurology consulted and appreciated, pending further recommendations  -LDL 65, hemoglobin A1c 5.7.  -Echocardiogram 12/19/2013: EF 55-60%  -Carotid doppler 12/20/2013: 1-39% ICA stenosis bilaterally. Antegrade vertebral flow.  -Dr Erlinda Hong discussed with nephew Barnabas Lister, plan  to start coumadin per pharmacy to dose. Discontinue aspirin when INR at goal.  -As an outpatient was Aspirin 325mg  and plavix 75mg  daily  -Started on Dysphagia 1 diet 9-15.  -Started on coumadin, per neuro recommendation   Hypernatremia patient not drinking enough, not eating well, she hold food in her mouth. Resolved with half NS, would not start D 5 IV fluid due to recent stroke. Resolved. In SNF pt's Na just came back at 151. IVF with 1/2 NS at 100 cc/hr for 1 liter   Stroke As above, under acute encephalopathy  HTN (hypertension) Norvasc and metoprolol, metoprolol was inc in hospital  Protein-calorie malnutrition, severe From underlying CVA and dementia  -Last speech recommendation from prior hospitalization recommended dysphagia 1 diet  -continue aspiration precautions  -Dysphagia 1 diet   Functional quadriplegia -Complicated by failure to thrive and deconditioning  -  PT and OT consulted    Leukocytosis, unspecified Resolved  -UA and chest x-ray show no infectious etiology  -Continue to monitor    Encounter for family conference without patient present Spoke at length with pt's nephew, who is her POA and his wife. Pt has no children but large extended family. Filled out MOST form with nephew. . NO to PEG. IVF maybe to give family time to see her one last time and other interventions on case by case. POA seems to understand she is at end of life.    Hennie Duos, MD

## 2014-01-26 NOTE — Assessment & Plan Note (Signed)
From underlying CVA and dementia  -Last speech recommendation from prior hospitalization recommended dysphagia 1 diet  -continue aspiration precautions  -Dysphagia 1 diet

## 2014-01-26 NOTE — Assessment & Plan Note (Signed)
Resolved  -UA and chest x-ray show no infectious etiology  -Continue to monitor

## 2014-01-26 NOTE — Assessment & Plan Note (Signed)
Norvasc and metoprolol, metoprolol was inc in hospital

## 2014-01-26 NOTE — Assessment & Plan Note (Signed)
secondary to Acute CVA ; admitted with recurrent stroke.  -CT head: Asymmetric focal subcortical area of white matter hypoattenuation in the high left frontal lobe is concerning for an acute nonhemorrhagic infarct  -MRI head: New left centrum semiovale nonhemorrhagic infarcts  -Neurology consulted and appreciated, pending further recommendations  -LDL 65, hemoglobin A1c 5.7.  -Echocardiogram 12/19/2013: EF 55-60%  -Carotid doppler 12/20/2013: 1-39% ICA stenosis bilaterally. Antegrade vertebral flow.  -Dr Erlinda Hong discussed with nephew Barnabas Lister, plan to start coumadin per pharmacy to dose. Discontinue aspirin when INR at goal.  -As an outpatient was Aspirin 325mg  and plavix 75mg  daily  -Started on Dysphagia 1 diet 9-15.  -Started on coumadin, per neuro recommendation

## 2014-01-27 ENCOUNTER — Encounter: Payer: Self-pay | Admitting: Internal Medicine

## 2014-01-27 NOTE — Progress Notes (Signed)
Patient ID: Cristina Patton, female   DOB: July 14, 1926, 80 y.o.   MRN: [id number]   This is an acute visit.  Level of care skilled.  Facility AF.  Date is 01/21/2014 Chief Complaint   Patient presents with   .   acute visit status post hospitalization for CVA     HPI: Patient is 80 y.o. Female who was recently hospitalized for acute encephalopathy thought secondary to acute CVA-she does have a previous history of CVA with left-sided hemiparesis.  Most recent CVA was thought to be an acute nonhemorrhagic infarct-recommendation was to start Coumadin When INR is at goal.--Recommendation to continue aspirin until INR is therapeutic  Previously she was on aspirin 325 mg and Plavix 75 mg a day.  She was also started on a dysphagia 1 diet.  Patient also was hypernatremic thought she was not drinking enough-holding food in her mouth-she did receive half-normal saline --did not start D5 IV fluid because of recent stroke-apparently this improve .  Past Medical History   Diagnosis  Date   .  Hypertension    .  CVA (cerebral infarction)    .  Stroke    History reviewed. No pertinent past surgical history.    Medication List         .            amLODipine 10 MG tablet    Commonly known as: NORVASC    Take 10 mg by mouth every morning.    aspirin 81 MG EC tablet    Take 1 tablet (81 mg total) by mouth daily.    atorvastatin 20 MG tablet    Commonly known as: LIPITOR    Take 1 tablet (20 mg total) by mouth daily at 6 PM.    feeding supplement (ENSURE) Pudg    Take 1 Container by mouth 3 (three) times daily between meals.    metoprolol tartrate 25 MG tablet    Commonly known as: LOPRESSOR    Take 1 tablet (25 mg total) by mouth 2 (two) times daily.    polyethylene glycol packet    Commonly known as: MIRALAX / GLYCOLAX    Take 17 g by mouth daily.    Potassium Chloride ER 20 MEQ Tbcr    Take 20 mEq by mouth daily.    warfarin 5 MG tablet    Commonly known as: COUMADIN    Take 1 tablet (5 mg total) by mouth daily.     No orders of the defined types were placed in this encounter.  There is no immunization history on file for this patient.  History   Substance Use Topics   .  Smoking status:  Never Smoker   .  Smokeless tobacco:  Not on file   .  Alcohol Use:  No   Family history is noncontributory  Review of Systems UTO -this is quite limited since patient is not speaking much -- however she has not apparently eating or drinking very well                   Physical Exam  Temperature 97.0 pulse 98 respirations 18 blood pressure 144/78.    GENERAL APPEARANCE: frail elderly female in no distress but quite weak  SKIN: No diaphoresis rash  HEAD: Normocephalic, atraumatic  EYES: Conjunctiva/lids clear. Pupils round, reactive. EOMs intact.  EARS: r. Hearing grossly normal.  NOSE: No deformity or discharge.  MOUTH/THROAT: Lips w/o lesions  RESPIRATORY: Breathing is even, unlabored. Lung  sounds are clear--but respiratory effort is poor   CARDIOVASCULAR: Heart RRR no murmurs, rubs or gallops. No peripheral edema.  GASTROINTESTINAL: Abdomen is soft, non-tender,  w/ normal bowel sounds.    MUSCULOSKELETAL: No abnormal joints or musculature continues with left-sided weakness  is able to move her right arm somewhat  But very weak NEUROLOGIC: Difficult to assess but patient is not really speak much-has weakness  PSYCHIATRIC: no behavioral issues  Patient Active Problem List    Diagnosis  Date Noted   .  Functional quadriplegia  01/26/2014   .  Encounter for family conference without patient present  01/26/2014   .  Essential hypertension  01/25/2014   .  Dysphagia, unspecified(787.20)  01/18/2014   .  Weakness generalized  01/18/2014   .  Palliative care encounter  01/18/2014   .  Stroke  01/14/2014   .  Inguinal hernia  12/26/2013   .  Acute CVA (cerebrovascular accident)  12/18/2013   .  CAP (community acquired pneumonia)  12/17/2013   .  History of  stroke  12/17/2013   .  Accelerated hypertension  12/17/2013   .  Left hemiparesis  12/17/2013   .  Essential thrombocythemia  12/17/2013   .  Unspecified constipation  06/18/2013   .  Urinary retention  05/10/2013   .  UTI (urinary tract infection)  04/03/2013   .  GI bleed  04/02/2013   .  Acute blood loss anemia  04/02/2013   .  CVA (cerebral infarction)  04/02/2013   .  Protein-calorie malnutrition, severe  03/21/2013   .  Dehydration  03/20/2013   .  Acute renal failure  03/20/2013   .  Leukocytosis, unspecified  03/20/2013   .  Acute encephalopathy  03/20/2013   .  Fall at home- ? syncope.  03/20/2013   .  Hypernatremia  03/20/2013   .  HTN (hypertension)  03/20/2013   .  Hypothermia  03/20/2013   .  Transaminitis  03/20/2013    Labs.  01/21/2014.  INR 1.1 this was in the hospital.  CBC 12.3 hemoglobin 11.9 platelets 265.  Sodium 144 potassium 3.4 BUN 9 creatinine 0.38.    Assessment and Plan  Acute encephalopathy  secondary to Acute CVA ; admitted with recurrent stroke.  -CT head: Asymmetric focal subcortical area of white matter hypoattenuation in the high left frontal lobe is concerning for an acute nonhemorrhagic infarct  -MRI head: New left centrum semiovale nonhemorrhagic infarcts  -Neurology consulted and appreciated, pending further recommendations  -LDL 65, hemoglobin A1c 5.7.  -Echocardiogram 12/19/2013: EF 55-60%  -Carotid doppler 12/20/2013: 1-39% ICA stenosis bilaterally. Antegrade vertebral flow.  -Dr Erlinda Hong discussed with nephew Barnabas Lister, plan to start coumadin per pharmacy to dose. Discontinue aspirin when INR at goal.  -As an outpatient was Aspirin 325mg  and plavix 75mg  daily  -Started on Dysphagia 1 diet 9-15.  -Started on coumadin, per neuro recommendation --- INR is still subtherapeutic Will increase Coumadin to 5 mg a day and recheck this in 2 days Hypernatre mia   I suspect his may continue to be an issue-will order labs including a metabolic panel to  keep an eye on this..  Stroke  As above, under acute encephalopathy  HTN (hypertension)  Norvasc and metoprolol, metoprolol was inc in hospital--recent blood pressure 144/78-166/90-at this point will monitor since we have fairly minimal readings  Protein-calorie malnutrition, severe  From underlying CVA and dementia  -Last speech recommendation from prior hospitalization recommended dysphagia 1 diet  -  continue aspiration precautions  -Dysphagia 1 diet  Functional quadriplegia  -Complicated by failure to thrive and deconditioning  -PT and OT consulted --- I. suspect this will be challenging Leukocytosis, unspecified  Resolved  -UA and chest x-ray showed no infectious etiology  -Continue to monito--we'll update CBC r    patient continues to be quite fragile -- I suspect her rehabilitation course will be quite difficult-awill order metabolic panel to keep an eye on her electrolytes renal function as well as anemia--will order lab work including a CBC --will have expedient follow up with Dr. Sheppard Coil later this week   [id number] note greater than 40 minutes spent assessing patient-reviewing her chart-and coordinating and formulating a plan of care for numerous diagnoses-of note greater than 50% of time spent coordinating plan of care .

## 2014-01-28 ENCOUNTER — Non-Acute Institutional Stay (SKILLED_NURSING_FACILITY): Payer: Medicare Other | Admitting: Internal Medicine

## 2014-01-28 ENCOUNTER — Encounter: Payer: Self-pay | Admitting: Internal Medicine

## 2014-01-28 DIAGNOSIS — Z7901 Long term (current) use of anticoagulants: Secondary | ICD-10-CM

## 2014-01-28 DIAGNOSIS — E870 Hyperosmolality and hypernatremia: Secondary | ICD-10-CM

## 2014-01-28 DIAGNOSIS — I639 Cerebral infarction, unspecified: Secondary | ICD-10-CM

## 2014-01-28 DIAGNOSIS — R627 Adult failure to thrive: Secondary | ICD-10-CM

## 2014-01-28 DIAGNOSIS — I6350 Cerebral infarction due to unspecified occlusion or stenosis of unspecified cerebral artery: Secondary | ICD-10-CM

## 2014-01-28 NOTE — Progress Notes (Signed)
Patient ID: Cristina Patton, female   DOB: 08/03/1926, 80 y.o.   MRN: [id number]   Chief Complaint   Patient presents with   .   acute visit status post failure to thrive-with history of multiple CVAs-followup anticoagulation management   HPI: Patient is 80 y.o. female who is admitted after recurrent CVA leaving her a functional quadriplegic with dementia.--She has not done very well since her return that she is not really eating much pocketing her food-and continues to be extremely weak.  Dr. Sheppard Coil did discuss patient's status with her responsible party late last week-and aMOSTt form has been filled out- per discussion with responsible party IV fluids can be administered for defined period of time for a case by case basis--and Dr. Sheppard Coil did start half-normal saline at 100 cc an hour for 1 L--lab on Sept  23rd did show sodium of 151.  Today patient continues to be intermittently lethargic-apparently still pocketing her food-she is on Coumadin secondary to the multiple CVAs-and INR super therapeutic at 8 today-she is on 6 mg of Coumadin INR last week was 2.7.  Patient is not really talking much and cannot give any review of systems-do not at times her blood pressure is elevated today 151/74-she is on metoprolol as well as Norvasc  .    Past Medical History   Diagnosis  Date   .  Hypertension    .  CVA (cerebral infarction)    .  Stroke    History reviewed. No pertinent past surgical history.    Medication List         .            amLODipine 10 MG tablet    Commonly known as: NORVASC    Take 10 mg by mouth every morning.    aspirin 81 MG EC tablet    Take 1 tablet (81 mg total) by mouth daily.    atorvastatin 20 MG tablet    Commonly known as: LIPITOR    Take 1 tablet (20 mg total) by mouth daily at 6 PM.    feeding supplement (ENSURE) Pudg    Take 1 Container by mouth 3 (three) times daily between meals.    metoprolol tartrate 25 MG tablet    Commonly known as:  LOPRESSOR    Take 1 tablet (25 mg total) by mouth 2 (two) times daily.    polyethylene glycol packet    Commonly known as: MIRALAX / GLYCOLAX    Take 17 g by mouth daily.    Potassium Chloride ER 20 MEQ Tbcr    Take 20 mEq by mouth daily.    warfarin 6 MG tablet    Commonly known as: COUMADIN    Take 1 tablet (5 mg total) by mouth daily.     No orders of the defined types were placed in this encounter.  There is no immunization history on file for this patient.  History   Substance Use Topics   .  Smoking status:  Never Smoker   .  Smokeless tobacco:  Not on file   .  Alcohol Use:  No   Family history is noncontributory  Review of Systems UTO -per nursing not eating or drinking, not very responsive                    Physical Exam  Temperature 97.9 pulse 80 respirations 20 blood pressure 151/74 GENERAL APPEARANCE: somnalent  SKIN: No diaphoresis rash --no increased bruising or bleeding  noted HEAD: Normocephalic, atraumatic  EYES: Conjunctiva/lids clear. Pupils round, reactive. EOMs intact.  EARS: External exam WNL, canals clear. Hearing grossly normal.  NOSE: No deformity or discharge.  MOUTH/THROAT: Lips w/o lesions  RESPIRATORY: Breathing is even, unlabored. Lung sounds are clear but respiratory effort is quite poor  CARDIOVASCULAR: Heart RRR no murmurs, rubs or gallops. No peripheral edema.  GASTROINTESTINAL: Abdomen is soft, non-tender, not distended w/ normal bowel sounds.    MUSCULOSKELETAL: No abnormal joints or musculature has significant weakness with a history of left-sided weakness with history of CVA  NEUROLOGIC: Pt minimally responsive, does open her eyes at times  PSYCHIATRIC: no behavioral issues  Patient Active Problem List    Diagnosis  Date Noted   .  Functional quadriplegia  01/26/2014   .  Encounter for family conference without patient present  01/26/2014   .  Essential hypertension  01/25/2014   .  Dysphagia, unspecified(787.20)  01/18/2014   .   Weakness generalized  01/18/2014   .  Palliative care encounter  01/18/2014   .  Stroke  01/14/2014   .  Inguinal hernia  12/26/2013   .  Acute CVA (cerebrovascular accident)  12/18/2013   .  CAP (community acquired pneumonia)  12/17/2013   .  History of stroke  12/17/2013   .  Accelerated hypertension  12/17/2013   .  Left hemiparesis  12/17/2013   .  Essential thrombocythemia  12/17/2013   .  Unspecified constipation  06/18/2013   .  Urinary retention  05/10/2013   .  UTI (urinary tract infection)  04/03/2013   .  GI bleed  04/02/2013   .  Acute blood loss anemia  04/02/2013   .  CVA (cerebral infarction)  04/02/2013   .  Protein-calorie malnutrition, severe  03/21/2013   .  Dehydration  03/20/2013   .  Acute renal failure  03/20/2013   .  Leukocytosis, unspecified  03/20/2013   .  Acute encephalopathy  03/20/2013   .  Fall at home- ? syncope.  03/20/2013   .  Hypernatremia  03/20/2013   .  HTN (hypertension)  03/20/2013   .  Hypothermia  03/20/2013   .  Transaminitis  03/20/2013   CBC    Component  Value  Date/Time    WBC  10.1  01/22/2014    WBC  12.3*  01/19/2014 0442    RBC  4.50  01/19/2014 0442    HGB  12.6  01/22/2014    HCT  38  01/22/2014    PLT  747*  01/22/2014    MCV  80.9  01/19/2014 0442    LYMPHSABS  1.4  01/14/2014 1511    MONOABS  1.9*  01/14/2014 1511    EOSABS  0.0  01/14/2014 1511    BASOSABS  0.0  01/14/2014 1511   CMP    Component  Value  Date/Time    NA  151*  01/22/2014    NA  144  01/19/2014 0442    K  4.1  01/22/2014    CL  104  01/19/2014 0442    CO2  21  01/19/2014 0442    GLUCOSE  88  01/19/2014 0442    BUN  23*  01/22/2014    BUN  9  01/19/2014 0442    CREATININE  0.6  01/22/2014    CREATININE  0.38*  01/19/2014 0442    CALCIUM  9.2  01/19/2014 0442    PROT  8.5*  01/14/2014 1511    ALBUMIN  3.5  01/14/2014 1511    AST  22  01/14/2014 1511    ALT  8  01/14/2014 1511    ALKPHOS  75  01/14/2014 1511    BILITOT  1.0  01/14/2014 1511    GFRNONAA  >90   01/19/2014 0442    GFRAA  >90  01/19/2014 0442   Assessment and Plan  Acute encephalopathy  secondary to Acute CVA ; admitted with recurrent stroke.  -CT head: Asymmetric focal subcortical area of white matter hypoattenuation in the high left frontal lobe is concerning for an acute nonhemorrhagic infarct  -MRI head: New left centrum semiovale nonhemorrhagic infarcts  -Neurology consulted and appreciated, pending further recommendations  -LDL 65, hemoglobin A1c 5.7.  -Echocardiogram 12/19/2013: EF 55-60%  -Carotid doppler 12/20/2013: 1-39% ICA stenosis bilaterally. Antegrade vertebral flow.  -Dr Erlinda Hong discussed with nephew Barnabas Lister, plan to start coumadin per pharmacy to dose. Discontinue aspirin.   -Started on Dysphagia 1 diet 9-15.  -Started on coumadin, per neuro recommendation--INR super therapeutic at 8-Will give vitamin K 2.5 mg and recheck INR tomorrow-bedrest until further notice  Hypernatremia  patient not drinking enough, not eating well, she hold food in her mouth.--She did receive an IV 1 L of half-normal saline last week-I did discuss this with her responsible party via phone and we'll restart the IV for 2 L--- he feels at this point she is a bit better when she has IV fluids although he realizes that her condition is end-stage    Stroke  As above, under acute encephalopathy  HTN (hypertension)  Norvasc and metoprolol, metoprolol was inc in hospital--- do not somewhat elevated systolics-this was discussed with Dr. Sheppard Coil via phone-at this point will monitor concern with patient status and adding an additional medication-at this point hesitant to increase beta blocker secondary to concerns that it may aggravate her fluid status   Protein-calorie malnutrition, severe  From underlying CVA and dementia  -Last speech recommendation from prior hospitalization recommended dysphagia 1 diet  -continue aspiration precautions  -Dysphagia 1 diet  Functional quadriplegia  -Complicated by failure  to thrive and decondition Leukocytosis, unspecified  Resolved  -UA and chest x-ray in hospital show no infectious etiology  -C      Patient does appear to be entering the terminal stages and her responsible party  seems to understand this-however he would like her to receive IV fluids for now--- again there are orders for no PEG tube and no hospitalization unless comfort needs cannot be  Met in  [place]

## 2014-01-31 ENCOUNTER — Non-Acute Institutional Stay (SKILLED_NURSING_FACILITY): Payer: Medicare Other | Admitting: Internal Medicine

## 2014-01-31 ENCOUNTER — Encounter: Payer: Self-pay | Admitting: Internal Medicine

## 2014-01-31 DIAGNOSIS — IMO0002 Reserved for concepts with insufficient information to code with codable children: Secondary | ICD-10-CM

## 2014-01-31 DIAGNOSIS — R627 Adult failure to thrive: Secondary | ICD-10-CM

## 2014-01-31 DIAGNOSIS — I639 Cerebral infarction, unspecified: Secondary | ICD-10-CM

## 2014-01-31 NOTE — Progress Notes (Signed)
Patient ID: Cristina Patton, female   DOB: August 12, 1926, 80 y.o.   MRN: [id number]   this is an acute visit.  Level of care skilled.  Facility [facility].   Chief Complaint   Patient presents with   .  acute visit status post failure to thrive-with history of multiple CVAs-   HPI: Patient is 80 y.o. female who is admitted after recurrent CVA leaving her a functional quadriplegic with dementia.--She has not done very well since her return that she is not really eating much pocketing her food-and continues to be extremely weak.  Dr. Sheppard Coil did discuss patient's status with her responsible party late last week-and aMOSTt form has been filled out- per discussion with responsible party IV fluids can be administered for defined period of time for a case by case basis--and Dr. Sheppard Coil did start half-normal saline at 100 cc an hour for 1 L--lab on Sept 23rd did show sodium of 151.   I did speak with her responsible party earlier this week-and continued the IV fluids for 2 more liters-of note her INR also was elevated at 8.0 her Coumadin has been held and INR is now down to 3.0.  Patient has completed her IV fluids and appears to be relatively baseline with what I saw earlier this week.  She does appear to be in the end stages of life she is responsive but very weak and lethargic.  I did speak with her responsible party again via phone-he understands the patient is in the end stages of life- and I did discuss the IV fluids-he is comfortable with no longer administering these-secondary to patient's end-stage prognosis and emphasis on comfort.  Patient still is pocketing her food and medications and is not really benefiting from these-I will discontinue these as well as the Coumadin.  Again the emphasis is on comfort.  She does not appear to be in any distress but we'll have to monitor her pain management.    .  Past Medical History   Diagnosis  Date   .  Hypertension    .  CVA (cerebral  infarction)    .  Stroke    History reviewed. No pertinent past surgical history.    Medication List         .            amLODipine 10 MG tablet    Commonly known as: NORVASC    Take 10 mg by mouth every morning.    aspirin 81 MG EC tablet    Take 1 tablet (81 mg total) by mouth daily.    atorvastatin 20 MG tablet    Commonly known as: LIPITOR    Take 1 tablet (20 mg total) by mouth daily at 6 PM.    feeding supplement (ENSURE) Pudg    Take 1 Container by mouth 3 (three) times daily between meals.    metoprolol tartrate 25 MG tablet    Commonly known as: LOPRESSOR    Take 1 tablet (25 mg total) by mouth 2 (two) times daily.    polyethylene glycol packet    Commonly known as: MIRALAX / GLYCOLAX    Take 17 g by mouth daily.    Potassium Chloride ER 20 MEQ Tbcr    Take 20 mEq by mouth daily.    warfarin 6 MG tablet    Commonly known as: COUMADIN    Take 1 tablet (5 mg total) by mouth daily.     No orders of the defined  types were placed in this encounter.  There is no immunization history on file for this patient.  History   Substance Use Topics   .  Smoking status:  Never Smoker   .  Smokeless tobacco:  Not on file   .  Alcohol Use:  No   Family history is noncontributory  Review of Systems UTO -per nursing not eating or drinking, is responsive but still largely lethargic-does not appear to be uncomfortable however                   Physical Exam  Temperature 98.2 pulse 82 respirations 18 blood pressure 144/95 GENERAL APPEARANCE: somnalent but arousable SKIN: No diaphoresis rash --no increased bruising or bleeding noted  HEAD: Normocephalic, atraumatic  EYES: Conjunctiva/lids clear. Pupils round, reactive. EOMs intact.  EARS: E. Hearing grossly normal.  NOSE: No deformity or discharge.  MOUTH/THROAT: Lips w/o lesions--mucous membranes appear dry  RESPIRATORY: Breathing is even, unlabored. Lung sounds are clear but respiratory effort is quite poor    CARDIOVASCULAR: Heart RRR no murmurs, rubs or gallops. No peripheral edema.  GASTROINTESTINAL: Abdomen is soft, non-tender, not distended w/ normal bowel sounds.  MUSCULOSKELETAL: No abnormal joints or musculature has significant weakness with a history of left-sided weakness with history of CVA  NEUROLOGIC: Pt  responsive, has her eyes open-does respond at times conversation-I asked her she was hurting she says no o not hurting  PSYCHIATRIC: no behavioral issues  Patient Active Problem List    Diagnosis  Date Noted   .  Functional quadriplegia  01/26/2014   .  Encounter for family conference without patient present  01/26/2014   .  Essential hypertension  01/25/2014   .  Dysphagia, unspecified(787.20)  01/18/2014   .  Weakness generalized  01/18/2014   .  Palliative care encounter  01/18/2014   .  Stroke  01/14/2014   .  Inguinal hernia  12/26/2013   .  Acute CVA (cerebrovascular accident)  12/18/2013   .  CAP (community acquired pneumonia)  12/17/2013   .  History of stroke  12/17/2013   .  Accelerated hypertension  12/17/2013   .  Left hemiparesis  12/17/2013   .  Essential thrombocythemia  12/17/2013   .  Unspecified constipation  06/18/2013   .  Urinary retention  05/10/2013   .  UTI (urinary tract infection)  04/03/2013   .  GI bleed  04/02/2013   .  Acute blood loss anemia  04/02/2013   .  CVA (cerebral infarction)  04/02/2013   .  Protein-calorie malnutrition, severe  03/21/2013   .  Dehydration  03/20/2013   .  Acute renal failure  03/20/2013   .  Leukocytosis, unspecified  03/20/2013   .  Acute encephalopathy  03/20/2013   .  Fall at home- ? syncope.  03/20/2013   .  Hypernatremia  03/20/2013   .  HTN (hypertension)  03/20/2013   .  Hypothermia  03/20/2013   .  Transaminitis  03/20/2013   CBC    Component  Value  Date/Time    WBC  10.1  01/22/2014    WBC  12.3*  01/19/2014 0442    RBC  4.50  01/19/2014 0442    HGB  12.6  01/22/2014    HCT  38  01/22/2014    PLT   747*  01/22/2014    MCV  80.9  01/19/2014 0442    LYMPHSABS  1.4  01/14/2014 1511  MONOABS  1.9*  01/14/2014 1511    EOSABS  0.0  01/14/2014 1511    BASOSABS  0.0  01/14/2014 1511   CMP    Component  Value  Date/Time    NA  151*  01/22/2014    NA  144  01/19/2014 0442    K  4.1  01/22/2014    CL  104  01/19/2014 0442    CO2  21  01/19/2014 0442    GLUCOSE  88  01/19/2014 0442    BUN  23*  01/22/2014    BUN  9  01/19/2014 0442    CREATININE  0.6  01/22/2014    CREATININE  0.38*  01/19/2014 0442    CALCIUM  9.2  01/19/2014 0442    PROT  8.5*  01/14/2014 1511    ALBUMIN  3.5  01/14/2014 1511    AST  22  01/14/2014 1511    ALT  8  01/14/2014 1511    ALKPHOS  75  01/14/2014 1511    BILITOT  1.0  01/14/2014 1511    GFRNONAA  >90  01/19/2014 0442    GFRAA  >90  01/19/2014 0442   Assessment and Plan  Acute encephalopathy  secondary to Acute CVA ; admitted with recurrent stroke.  -CT head: Asymmetric focal subcortical area of white matter hypoattenuation in the high left frontal lobe is concerning for an acute nonhemorrhagic infarct  -MRI head: New left centrum semiovale nonhemorrhagic infarcts  -Neurology consulted and appreciated, pending further recommendations  -LDL 65, hemoglobin A1c 5.7.  -Echocardiogram 12/19/2013: EF 55-60%  -Carotid doppler 12/20/2013: 1-39% ICA stenosis bilaterally. Antegrade vertebral flow.  -Dr Erlinda Hong discussed with nephew Barnabas Lister, plan to start coumadin per pharmacy to dose. Discontinue aspirin.  -Started on Dysphagia 1 diet 9-15 as noted above Patient continues to decline gradually-I did speak extensively with her responsible party earlier this week and again today via phone-emphasis on comfort care-.  -   Hypernatremia  patient not drinking enough, not eating well, she hold food in her mouth.-IV fluids have been administered the patient continues to decline-and family as noted above desires comfort care no further IV fluids   Stroke  As above  HTN (hypertension)  Norvasc and  metoprolol, apparently she is not swallowing these nonetheless her blood pressure does not appear to be out of control at this point will dicontinue  Again patient appears to be declining-in the end stages of life-comfort care is the emphasis-for pain control will have to be monitored will write an order for Roxanol 5 mg every 4 hours when necessary and this may be titrated as needed.        [id number] --of note more than 25 minutes spent assessing patient-discussing her status with nursing staff-as well as discussing her status with her responsible party via phone-and coordinating and formulating a plan of care-of note greater than 50% of time spent coordinating plan of care

## 2014-03-03 DEATH — deceased

## 2015-04-22 IMAGING — CT CT HEAD W/O CM
4 series · 17 of 47 positions shown, 19 images · non-contrast
Comparison: None.

CLINICAL DATA: Question of fall.

EXAM:
CT HEAD WITHOUT CONTRAST
CT CERVICAL SPINE WITHOUT CONTRAST
TECHNIQUE: Multidetector CT imaging of the head and cervical spine was
performed following the standard protocol without intravenous
contrast. Multiplanar CT image reconstructions of the cervical spine
were also generated.

[Series 2: coronals · coronal · 0.38mm/px · 3 of 76 slices shown]
[im 26/76  brain]
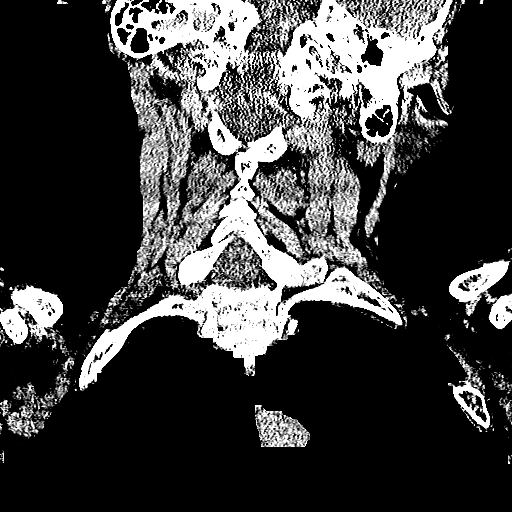
[im 34/76  brain]
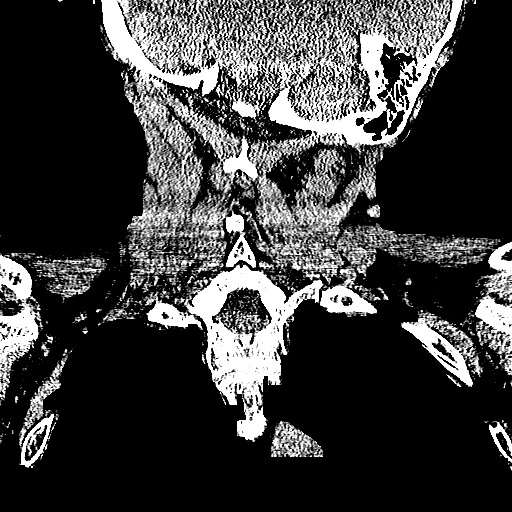
[im 42/76  brain]
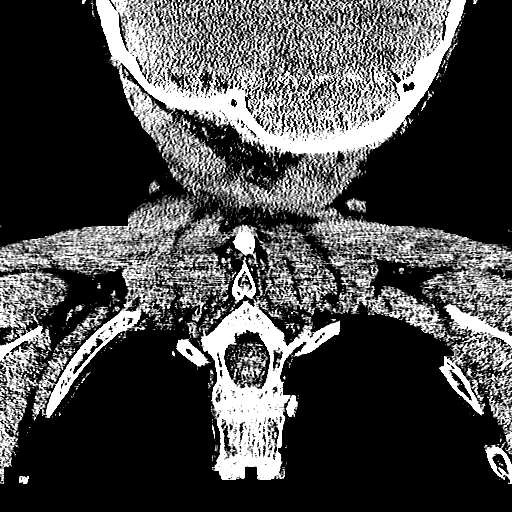

[Series 3: head 5.0 h30s · axial · 0.48mm/px · z∈[-87,+33]mm · 6 of 34 slices shown, 8 images]
[im 5/34  brain]
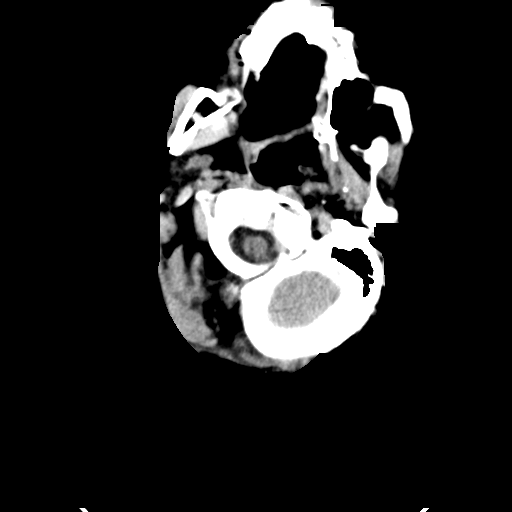
[im 5/34  bone]
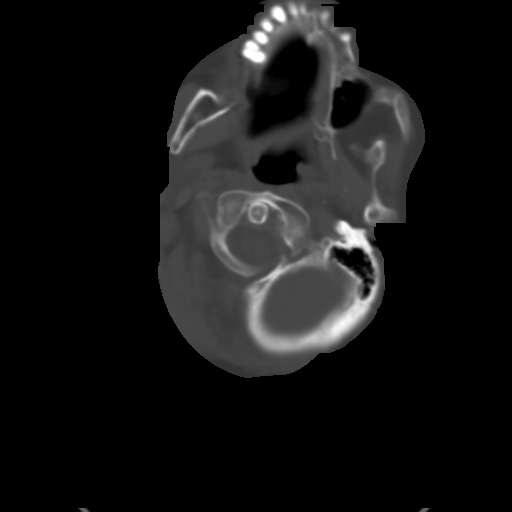
[im 10/34  brain]
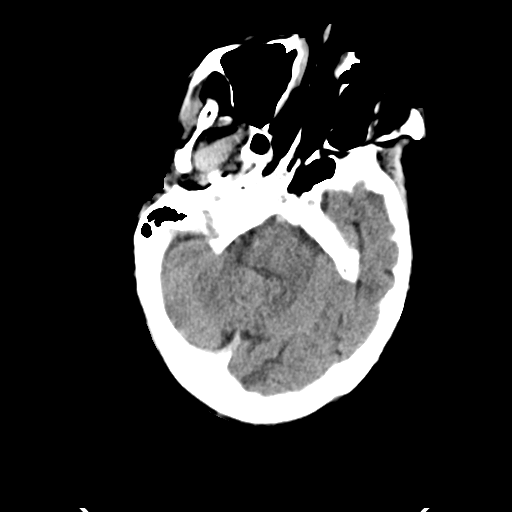
[im 15/34  brain]
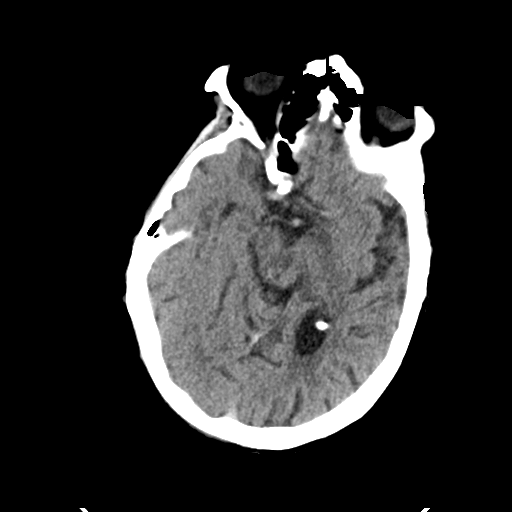
[im 19/34  brain]
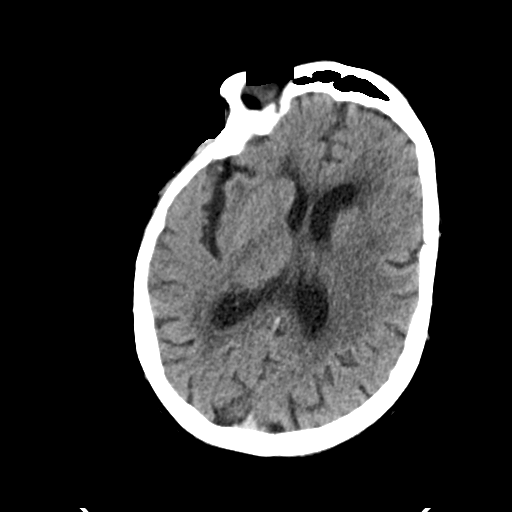
[im 24/34  brain]
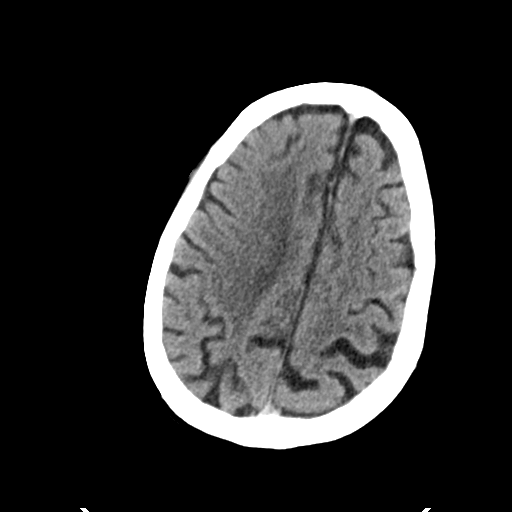
[im 24/34  bone]
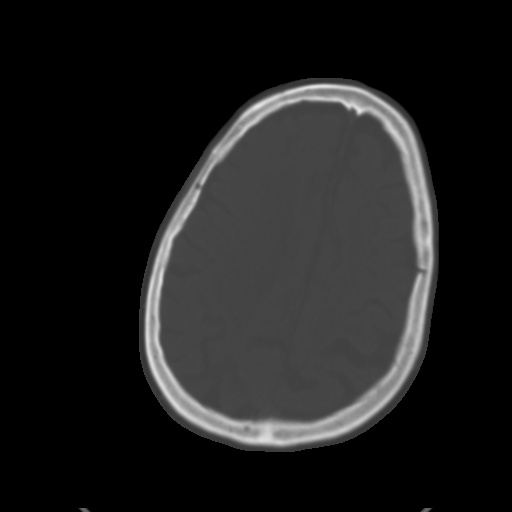
[im 29/34  brain]
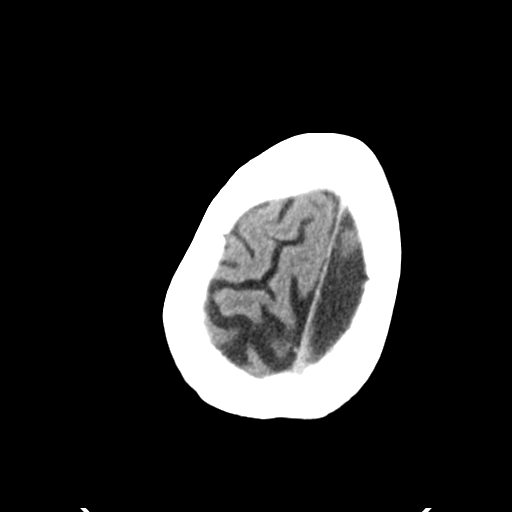

[Series 6: c_spine 2.0 i40s 3 · axial · 0.28mm/px · z∈[-230,-146]mm · 5 of 89 slices shown]
[im 9/89  brain]
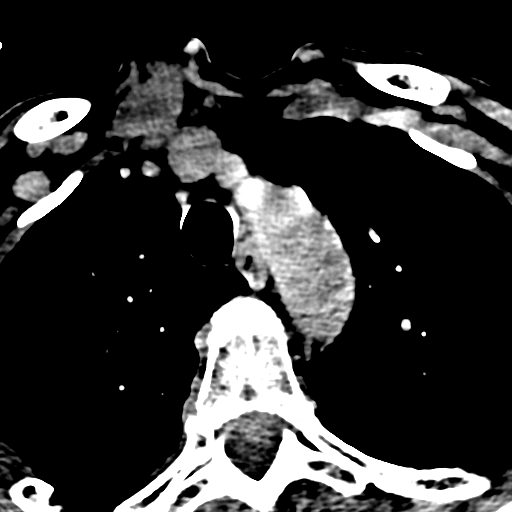
[im 17/89  brain]
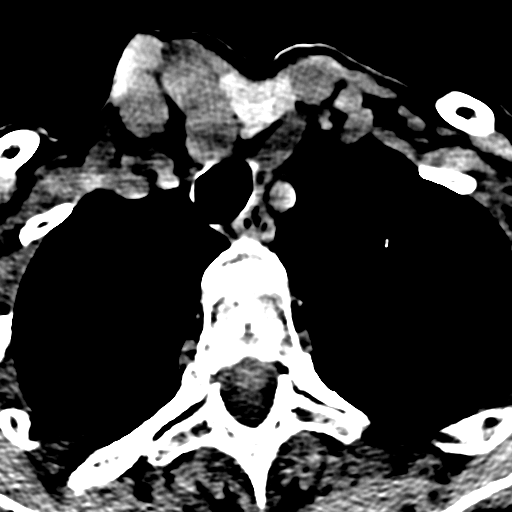
[im 30/89  brain]
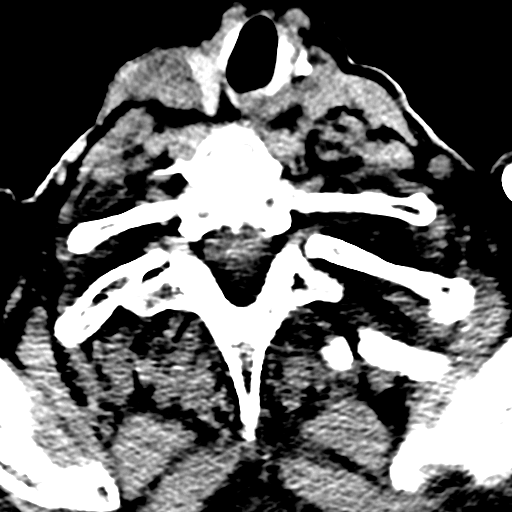
[im 38/89  brain]
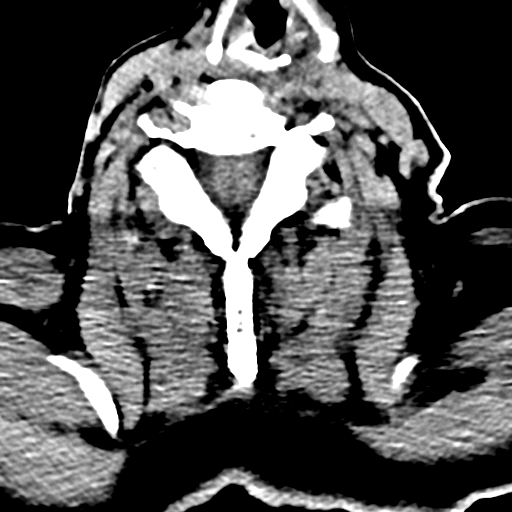
[im 51/89  brain]
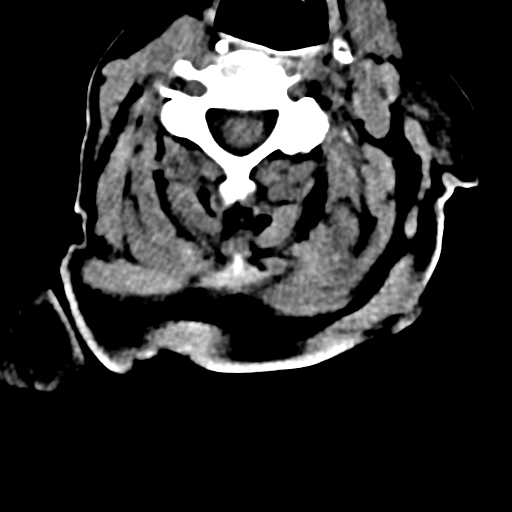

[Series 8: sagittals · sagittal · 0.35mm/px · 3 of 55 slices shown]
[im 19/55  brain]
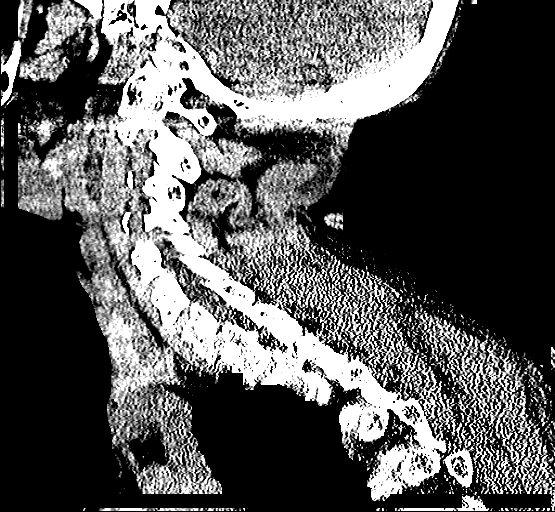
[im 28/55  brain]
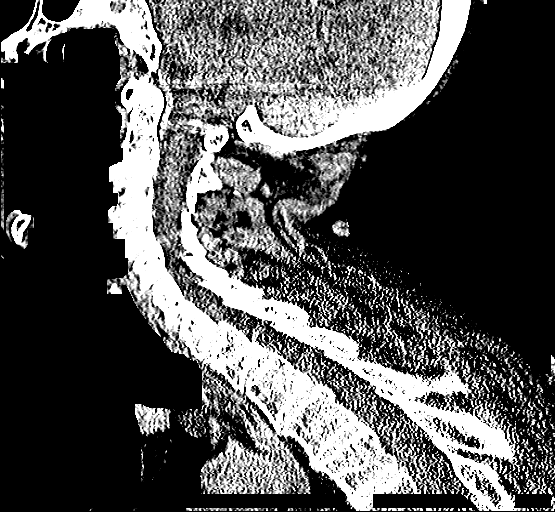
[im 37/55  brain]
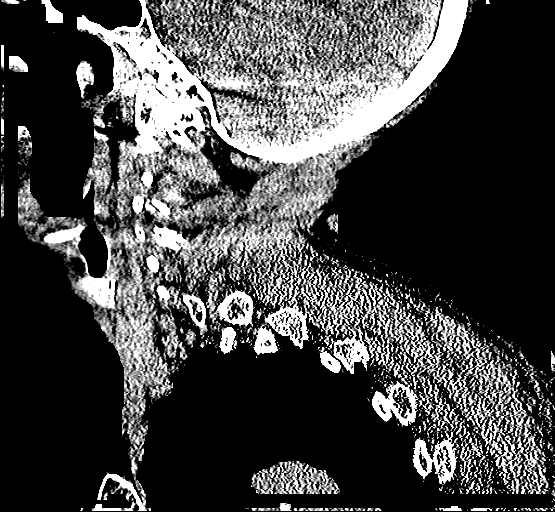

[17 of 47 positions shown; findings below may reference images not displayed]

FINDINGS: CT HEAD FINDINGS

No skull fracture or intracranial hemorrhage.

Small vessel disease type changes without CT evidence of large acute
infarct.

Mild atrophy.

Vascular calcifications.

No intracranial mass lesion noted on this unenhanced exam.

CT CERVICAL SPINE FINDINGS

No cervical spine fracture.

Head tilt to left. Mild curvature cervical spine. No abnormal
prevertebral soft tissue swelling. Question Mild edema posterior to
the paraspinal musculature. If there is a high clinical suspicion of
ligamentous injury, flexion and extension views or MR can be
performed for further delineation.

Scarring lung apices.
IMPRESSION: No intracranial hemorrhage or skull fracture.

No cervical spine fracture detected.

Please see above.
